# Patient Record
Sex: Female | Born: 1970
Health system: Southern US, Community
[De-identification: ages and names within clinical notes are randomized; demographics above are authoritative.]

## PROBLEM LIST (undated history)

## (undated) DIAGNOSIS — E785 Hyperlipidemia, unspecified: Secondary | ICD-10-CM

## (undated) DIAGNOSIS — D496 Neoplasm of unspecified behavior of brain: Secondary | ICD-10-CM

## (undated) DIAGNOSIS — M25562 Pain in left knee: Secondary | ICD-10-CM

## (undated) DIAGNOSIS — R519 Headache, unspecified: Secondary | ICD-10-CM

## (undated) DIAGNOSIS — D649 Anemia, unspecified: Secondary | ICD-10-CM

## (undated) DIAGNOSIS — F419 Anxiety disorder, unspecified: Secondary | ICD-10-CM

## (undated) DIAGNOSIS — E119 Type 2 diabetes mellitus without complications: Secondary | ICD-10-CM

## (undated) DIAGNOSIS — M1990 Unspecified osteoarthritis, unspecified site: Secondary | ICD-10-CM

## (undated) DIAGNOSIS — K219 Gastro-esophageal reflux disease without esophagitis: Secondary | ICD-10-CM

## (undated) DIAGNOSIS — M25561 Pain in right knee: Secondary | ICD-10-CM

## (undated) DIAGNOSIS — I10 Essential (primary) hypertension: Secondary | ICD-10-CM

## (undated) DIAGNOSIS — F410 Panic disorder [episodic paroxysmal anxiety] without agoraphobia: Secondary | ICD-10-CM

## (undated) HISTORY — DX: Type 2 diabetes mellitus without complications: E11.9

## (undated) HISTORY — DX: Anemia, unspecified: D64.9

## (undated) HISTORY — DX: Neoplasm of unspecified behavior of brain: D49.6

## (undated) HISTORY — DX: Pain in left knee: M25.562

## (undated) HISTORY — DX: Hyperlipidemia, unspecified: E78.5

## (undated) HISTORY — PX: ABDOMINAL HYSTERECTOMY: SHX81

## (undated) HISTORY — DX: Pain in right knee: M25.561

---

## 1998-11-22 DIAGNOSIS — K219 Gastro-esophageal reflux disease without esophagitis: Secondary | ICD-10-CM

## 1998-11-22 HISTORY — DX: Gastro-esophageal reflux disease without esophagitis: K21.9

## 2006-11-22 DIAGNOSIS — I10 Essential (primary) hypertension: Secondary | ICD-10-CM

## 2006-11-22 HISTORY — DX: Essential (primary) hypertension: I10

## 2007-11-23 HISTORY — PX: PARTIAL HYSTERECTOMY: SHX80

## 2009-11-22 HISTORY — PX: MENISCUS REPAIR: SHX5179

## 2009-11-22 HISTORY — PX: OTHER SURGICAL HISTORY: SHX169

## 2010-10-20 LAB — PULMONARY FUNCTION TEST

## 2010-11-22 DIAGNOSIS — M25562 Pain in left knee: Secondary | ICD-10-CM

## 2010-11-22 DIAGNOSIS — M25561 Pain in right knee: Secondary | ICD-10-CM

## 2010-11-22 DIAGNOSIS — F419 Anxiety disorder, unspecified: Secondary | ICD-10-CM

## 2010-11-22 DIAGNOSIS — F410 Panic disorder [episodic paroxysmal anxiety] without agoraphobia: Secondary | ICD-10-CM

## 2010-11-22 HISTORY — DX: Anxiety disorder, unspecified: F41.9

## 2010-11-22 HISTORY — DX: Pain in right knee: M25.561

## 2010-11-22 HISTORY — DX: Panic disorder (episodic paroxysmal anxiety): F41.0

## 2010-11-22 HISTORY — DX: Pain in left knee: M25.562

## 2012-01-12 ENCOUNTER — Emergency Department (HOSPITAL_COMMUNITY)
Admission: EM | Admit: 2012-01-12 | Discharge: 2012-01-12 | Disposition: A | Discharge status: Home / Self Care | Payer: Medicaid - Out of State | Attending: Emergency Medicine | Admitting: Emergency Medicine

## 2012-01-12 ENCOUNTER — Encounter (HOSPITAL_COMMUNITY): Payer: Self-pay | Admitting: Emergency Medicine

## 2012-01-12 DIAGNOSIS — I10 Essential (primary) hypertension: Secondary | ICD-10-CM | POA: Insufficient documentation

## 2012-01-12 DIAGNOSIS — H6092 Unspecified otitis externa, left ear: Secondary | ICD-10-CM

## 2012-01-12 DIAGNOSIS — H60399 Other infective otitis externa, unspecified ear: Secondary | ICD-10-CM | POA: Insufficient documentation

## 2012-01-12 DIAGNOSIS — H9209 Otalgia, unspecified ear: Secondary | ICD-10-CM | POA: Insufficient documentation

## 2012-01-12 HISTORY — DX: Essential (primary) hypertension: I10

## 2012-01-12 MED ORDER — CIPROFLOXACIN-DEXAMETHASONE 0.3-0.1 % OT SUSP
4.0000 [drp] | Freq: Two times a day (BID) | OTIC | Status: DC
  Filled 2012-01-12: qty 7.5

## 2012-01-12 MED ORDER — ANTIPYRINE-BENZOCAINE 5.4-1.4 % OT SOLN: 3.0000 [drp] | Freq: Four times a day (QID) | OTIC | Status: AC | PRN

## 2012-01-12 NOTE — ED Provider Notes (Signed)
History     CSN: [id number]  Arrival date & time 01/12/12  1452   First MD Initiated Contact with Patient 01/12/12 1546      Chief Complaint  Patient presents with  . Otalgia    putting drops in and nothing is going down, thinks something is in there lt side     (Consider location/radiation/quality/duration/timing/severity/associated sxs/prior treatment) HPI Comments: Patient reports three days of left ear pain.  States "it feels like something is moving in there" and she is concerned that there might be an insect in her ear.  Has been taking ibuprofen and tylenol without relief.  States she tried to put in some OTC ear drops but they would not go into her ear.  States she is now starting to have a sore throat.  Denies fevers, drainage from the ear.   The history is provided by the patient.    Past Medical History  Diagnosis Date  . Hypertension     History reviewed. No pertinent past surgical history.  No family history on file.  History  Substance Use Topics  . Smoking status: Not on file  . Smokeless tobacco: Not on file  . Alcohol Use:     OB History    Grav Para Term Preterm Abortions TAB SAB Ect Mult Living                  Review of Systems  All other systems reviewed and are negative.    Allergies  Review of patient's allergies indicates not on file.  Home Medications  No current outpatient prescriptions on file.  BP 134/91  Pulse 78  Temp(Src) 98 F (36.7 C) (Oral)  Resp 18  SpO2 100%  Physical Exam  Nursing note reviewed. Constitutional: She is oriented to person, place, and time. She appears well-developed and well-nourished.  HENT:  Head: Normocephalic and atraumatic.  Right Ear: Tympanic membrane normal.  Left Ear: Tympanic membrane normal. There is swelling and tenderness.  Mouth/Throat: Oropharynx is clear and moist. No oropharyngeal exudate.       Left ear pain with traction and with pressure on tragus.  Swelling of canal with small  amount of white exudate within canal.   Neck: Neck supple.  Pulmonary/Chest: Effort normal.  Neurological: She is alert and oriented to person, place, and time.    ED Course  Procedures (including critical care time)  Labs Reviewed - No data to display No results found.  5:22 PM ear wick with ciprodex placed in left ear canal.  Medication instructions explained to patient.   Filed Vitals:   01/12/12 1724  BP: 134/89  Pulse: 78  Temp:   Resp:      1. Otitis externa of left ear       MDM  Nontoxic patient with left ear pain, found to have swelling and tenderness of left ear canal c/w otitis externa.  Pt is not diabetic.          Dillard Cannon [place], [place] 01/12/12 1929

## 2012-01-12 NOTE — ED Provider Notes (Signed)
Medical screening examination/treatment/procedure(s) were performed by non-physician practitioner and as supervising physician I was immediately available for consultation/collaboration. Devoria Albe, MD, Armando Gang   Ward Givens, MD 01/12/12 [phone]

## 2012-01-12 NOTE — Discharge Instructions (Signed)
Please put 4 drops in your left ear twice daily for 7 days.  If the wick comes out during that time, please continue to use the drops.  The wick should be removed when you finish the drops.  If you need additional pain relief, please fill the prescription for auralgan, but do not use it within two hours before or after your medicated drops.  Continue to take ibuprofen and tylenol as needed for pain.  You may return to the ER at any time for worsening condition or any new symptoms that concern you.   Otitis Externa Otitis externa ("swimmer's ear") is a germ (bacterial) or fungal infection of the outer ear canal (from the eardrum to the outside of the ear). Swimming in dirty water may cause swimmer's ear. It also may be caused by moisture in the ear from water remaining after swimming or bathing. Often the first signs of infection may be itching in the ear canal. This may progress to ear canal swelling, redness, and pus drainage, which may be signs of infection. HOME CARE INSTRUCTIONS   Apply the antibiotic drops to the ear canal as prescribed by your doctor.   This can be a very painful medical condition. A strong pain reliever may be prescribed.   Only take over-the-counter or prescription medicines for pain, discomfort, or fever as directed by your caregiver.   If your caregiver has given you a follow-up appointment, it is very important to keep that appointment. Not keeping the appointment could result in a chronic or permanent injury, pain, hearing loss and disability. If there is any problem keeping the appointment, you must call back to this facility for assistance.  PREVENTION   It is important to keep your ear dry. Use the corner of a towel to wick water out of the ear canal after swimming or bathing.   Avoid scratching in your ear. This can damage the ear canal or remove the protective wax lining the canal and make it easier for germs (bacteria) or a fungus to grow.   You may use ear  drops made of rubbing alcohol and vinegar after swimming to prevent future "swimmer's ear" infections. Make up a small bottle of equal parts white vinegar and alcohol. Put 3 or 4 drops into each ear after swimming.   Avoid swimming in lakes, polluted water, or poorly chlorinated pools.  SEEK MEDICAL CARE IF:   An oral temperature above 102 F (38.9 C) develops.   Your ear is still painful after 3 days and shows signs of getting worse (redness, swelling, pain, or pus).  MAKE SURE YOU:   Understand these instructions.   Will watch your condition.   Will get help right away if you are not doing well or get worse.  Document Released: 11/08/2005 Document Revised: 07/21/2011 Document Reviewed: 06/14/2008 [facility] Patient Information 2012 [place], [place].  RESOURCE GUIDE  Dental Problems  Patients with Medicaid: [facility] [phone] [address]                                           [phone] [address] Phone:  [phone]  Phone:  [phone]  If unable to pay or uninsured, contact:  [facility] or [facility]. to become qualified for the adult dental clinic.  Chronic Pain Problems Contact [facility]  [phone] Patients need to be referred by their primary care doctor.  Insufficient Money for Medicine Contact United Way:  call "211" or [facility] [phone].  No Primary Care Doctor Call [facility]  [phone] Other agencies that provide inexpensive medical care    [facility]  [phone]    [facility] Medicine  [phone]    [facility]  [phone]    [facility]  [phone]    [facility]  [phone]    [facility] Child Clinic  [phone]  Psychological Services [facility] Behavioral Health  [phone] [facility] Services  [phone] [facility] Mental Health   [phone] (emergency services  [phone])  Substance Abuse Resources [facility] Drug Services  531-328-6808 [facility] [phone] [facility] [place] [phone] Floydene Flock [phone] Residential & Outpatient Substance Abuse Program  ([phone]  Abuse/Neglect [facility] Child Abuse Hotline 579-220-5051 [facility] Child Abuse Hotline [phone] (After Hours)  Emergency Shelter [facility] Ministries [phone]  Maternity Homes [facility] [place] [facility] [phone] [facility] [phone]  MRSA Hotline #:   [phone]    [facility] Resources  [facility]     United Way                          [facility] Dept. [address] [place]                       [address]      [address]  Blondell Reveal Phone:  [phone]                                   Phone:  [phone]                 Phone:  [phone]  [facility] Mental Health Phone:  [phone]  [facility] Child Abuse Hotline [phone] (After Hours)

## 2012-01-31 ENCOUNTER — Emergency Department (HOSPITAL_COMMUNITY)
Admission: EM | Admit: 2012-01-31 | Discharge: 2012-02-01 | Disposition: A | Discharge status: Home / Self Care | Payer: Medicaid - Out of State | Attending: Emergency Medicine | Admitting: Emergency Medicine

## 2012-01-31 ENCOUNTER — Other Ambulatory Visit: Payer: Self-pay

## 2012-01-31 ENCOUNTER — Encounter (HOSPITAL_COMMUNITY): Payer: Self-pay | Admitting: Emergency Medicine

## 2012-01-31 DIAGNOSIS — M79609 Pain in unspecified limb: Secondary | ICD-10-CM | POA: Insufficient documentation

## 2012-01-31 DIAGNOSIS — M79603 Pain in arm, unspecified: Secondary | ICD-10-CM

## 2012-01-31 DIAGNOSIS — K219 Gastro-esophageal reflux disease without esophagitis: Secondary | ICD-10-CM

## 2012-01-31 DIAGNOSIS — I10 Essential (primary) hypertension: Secondary | ICD-10-CM | POA: Insufficient documentation

## 2012-01-31 MED ORDER — GI COCKTAIL ~~LOC~~
30.0000 mL | Freq: Once | ORAL | Status: AC
  Administered 2012-02-01: 30 mL via ORAL
  Filled 2012-01-31: qty 30

## 2012-01-31 NOTE — ED Notes (Signed)
Pt is c/o pain in her left upper arm that started this evening  Pt states she has also had some shortness of breath  Pt states she has been moving and has had pain in her left wrist but this is different  Pt describes pain as a throbbing pain that comes and goes and states it is a burning pain as well

## 2012-01-31 NOTE — ED Notes (Signed)
Pt reports left shoulder pain. Pt reports burning and throbbing pain to left shoulder after cooking. Pt also reports she just moved and may have pulled muscle. Pt also has left hand pain that she sprained two weeks.  Pt reports she had shortness of breath initially, but shortness of breath is gone now.  Pt also having indigestion.

## 2012-01-31 NOTE — ED Provider Notes (Signed)
History     CSN: [id number]  Arrival date & time 01/31/12  2004   First MD Initiated Contact with Patient 01/31/12 2342      Chief Complaint  Patient presents with  . Shoulder Pain    (Consider location/radiation/quality/duration/timing/severity/associated sxs/prior treatment) Patient is a 41 y.o. female presenting with shoulder pain. The history is provided by the patient. No language interpreter was used.  Shoulder Pain This is a new problem. The current episode started today. The problem occurs constantly. The problem has been unchanged. Pertinent negatives include no chest pain, coughing, fever, joint swelling, nausea, neck pain, sore throat, vomiting or weakness.   Pt states that around 5pm while she was cooking she started having a burning sensation to her L upper arm that is constant since 5pm.  At one point she became SOB and thought she was having a panic attack.  States she is new in town from [place] and she has been moving and boxing things. 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 States that her only risk factor is hypertension. States she has been having epigastric pain just like her GERD and for 3 days and she has been taking mustard for that with relief.  States she ran out of her nexium and has not gotten a pcp yet.        Past Medical History  Diagnosis Date  . Hypertension     Past Surgical History  Procedure Date  . Partial hysterectomy   . Laproscopic knee surgery   . Cesarean section     Family History  Problem Relation Age of Onset  . Diabetes Mother   . Hypertension Other     History  Substance Use Topics  . Smoking status: Never Smoker   . Smokeless tobacco: Not on file  . Alcohol Use: Yes     occassioanl    OB History    Grav Para Term Preterm Abortions TAB SAB Ect Mult Living                  Review of Systems  Constitutional: Negative for fever.  HENT: Negative for sore throat and neck pain.   Respiratory:  Negative for cough.   Cardiovascular: Negative for chest pain.  Gastrointestinal: Negative for nausea and vomiting.  Musculoskeletal: Negative for joint swelling.  Neurological: Negative for weakness.    Allergies  Penicillins  Home Medications   Current Outpatient Rx  Name Route Sig Dispense Refill  . ACETAMINOPHEN 500 MG PO TABS Oral Take 1,000 mg by mouth once.    . ARTHRITIS STRENGTH BC POWDER PO Oral Take by mouth.    . IBUPROFEN 200 MG PO TABS Oral Take 400 mg by mouth every 4 (four) hours.    . INDAPAMIDE 2.5 MG PO TABS Oral Take 2.5 mg by mouth daily.    [name] VITAMIN D (ERGOCALCIFEROL) 50000 UNITS PO CAPS Oral Take 50,000 Units by mouth every 7 (seven) days. ON MONDAY      BP 134/100  Pulse 72  Temp(Src) 98.2 F (36.8 C) (Oral)  Resp 20  SpO2 99%  Physical Exam  Nursing note and vitals reviewed. Constitutional: She is oriented to person, place, and time. She appears well-developed and well-nourished.  HENT:  Head: Normocephalic and atraumatic.  Eyes: Conjunctivae and EOM are normal. Pupils are equal, round, and reactive to light.  Neck: Normal range of motion. Neck supple.  Cardiovascular: Normal rate, normal heart sounds and intact distal  pulses.  Exam reveals no gallop and no friction rub.   No murmur heard. Pulmonary/Chest: Effort normal and breath sounds normal. No respiratory distress.  Abdominal: Soft. Bowel sounds are normal.  Musculoskeletal: Normal range of motion. She exhibits no edema and no tenderness.       LUE pain  Neurological: She is alert and oriented to person, place, and time. She has normal reflexes.  Skin: Skin is warm and dry.  Psychiatric: She has a normal mood and affect.    ED Course  Procedures (including critical care time)  Labs Reviewed - No data to display No results found.   No diagnosis found.    MDM   Date: 02/01/2012  Rate: 68  Rhythm: normal sinus rhythm  QRS Axis: normal  Intervals: normal  ST/T Wave  abnormalities: normal  Conduction Disutrbances:none  Narrative Interpretation:   Old EKG Reviewed: none available   LUE burning pain with heartburn x 2 days. EKG and trop negative for MI.  Some relief with tylenol.  Good relief with GI cocktail.  Will renew her nexium and give percocet rx for LUE pain.  Return for CP, SOB.  Labs unremarkable.  Potassium for K 3.0. Will pick a PCP from list to follow up with asap.          Jethro Bastos, NP 02/02/12 1326

## 2012-02-01 LAB — DIFFERENTIAL
Lymphocytes Relative: 32 % (ref 12–46)
Lymphs Abs: 3.2 10*3/uL (ref 0.7–4.0)
Monocytes Absolute: 0.6 10*3/uL (ref 0.1–1.0)
Monocytes Relative: 6 % (ref 3–12)
Neutro Abs: 5.8 10*3/uL (ref 1.7–7.7)

## 2012-02-01 LAB — POCT I-STAT, CHEM 8
BUN: 15 mg/dL (ref 6–23)
Calcium, Ion: 1.19 mmol/L (ref 1.12–1.32)
Chloride: 101 mEq/L (ref 96–112)
Glucose, Bld: 141 mg/dL — ABNORMAL HIGH (ref 70–99)
Potassium: 3 mEq/L — ABNORMAL LOW (ref 3.5–5.1)

## 2012-02-01 LAB — CBC
HCT: 39.5 % (ref 36.0–46.0)
Hemoglobin: 13.9 g/dL (ref 12.0–15.0)
RBC: 4.7 MIL/uL (ref 3.87–5.11)
WBC: 9.8 10*3/uL (ref 4.0–10.5)

## 2012-02-01 MED ORDER — ESOMEPRAZOLE MAGNESIUM 40 MG PO CPDR: 40.0000 mg | DELAYED_RELEASE_CAPSULE | Freq: Every day | ORAL | Status: DC

## 2012-02-01 MED ORDER — OXYCODONE-ACETAMINOPHEN 5-325 MG PO TABS: 1.0000 | ORAL_TABLET | ORAL | Status: AC | PRN

## 2012-02-01 MED ORDER — POTASSIUM CHLORIDE CRYS ER 20 MEQ PO TBCR
40.0000 meq | EXTENDED_RELEASE_TABLET | Freq: Two times a day (BID) | ORAL | Status: DC
Start: 2012-02-01 — End: 2012-02-01
  Administered 2012-02-01: 40 meq via ORAL
  Filled 2012-02-01: qty 2

## 2012-02-01 MED ORDER — OXYCODONE-ACETAMINOPHEN 5-325 MG PO TABS: 2.0000 | ORAL_TABLET | ORAL | Status: DC | PRN

## 2012-02-01 MED ORDER — ACETAMINOPHEN 325 MG PO TABS
650.0000 mg | ORAL_TABLET | Freq: Once | ORAL | Status: AC
  Administered 2012-02-01: 650 mg via ORAL
  Filled 2012-02-01: qty 2

## 2012-02-01 NOTE — Discharge Instructions (Signed)
Ms Woodroof the EKG and trop which show if you are having a heart attack tonight in the ER were negative. Your potassium was slightly low at 3.0 and 3.5 so we gave him 40 mEq of potassium. I refilled her Nexium for your current. Stopped taking diabetes powders this could be causing pain in your epigastric area. Take Percocet for pain do not drive with this medication. Return to the ER for any severe chest pain shortness of breath or any other concern she might have. PCP from the list below.     RESOURCE GUIDE  Dental Problems  Patients with Medicaid: [facility] [phone] [address]                                           [phone] [address] Phone:  [phone]                                                  Phone:  [phone]  If unable to pay or uninsured, contact:  [facility] or [facility]. to become qualified for the adult dental clinic.  Chronic Pain Problems Contact [facility]  [phone] Patients need to be referred by their primary care doctor.  Insufficient Money for Medicine Contact United Way:  call "211" or [facility] [phone].  No Primary Care Doctor Call [facility]  [phone] Other agencies that provide inexpensive medical care    [facility]  [phone]    [facility] Internal Medicine  [phone]    [facility]  [phone]    [facility]  [phone]    [facility]  [phone]    [facility] Child Clinic  [phone]  Psychological Services [facility] Behavioral Health  [phone] [facility] Services  [phone] [facility] Mental Health   [phone] (emergency services [phone])  Substance Abuse Resources Alcohol and Drug Services  [phone] [facility] [phone] [facility] [place] [phone] Floydene Flock [phone] Residential & Outpatient Substance Abuse Program  [phone]  Abuse/Neglect [facility] Child Abuse  Hotline 706-175-7647 [facility] Child Abuse Hotline 843-742-5942 (After Hours)  Emergency Shelter [facility] Ministries [phone]  Maternity Homes Room at the [place] [facility] [phone] [facility] [phone]  MRSA Hotline #:   561-665-2854    [facility] Resources  [facility]     United Way                          [facility] Dept. [address]                        [address]      [address]  [address]  Sela Hua Phone:  [phone]                                   Phone:  [phone]                 Phone:  [place] Phone:  [place] [phone] (After Hours)

## 2012-02-02 NOTE — ED Provider Notes (Signed)
Medical screening examination/treatment/procedure(s) were performed by non-physician practitioner and as supervising physician I was immediately available for consultation/collaboration.   Tamee Battin M Sky Primo, MD 02/02/12 2311 

## 2012-03-08 ENCOUNTER — Emergency Department (HOSPITAL_COMMUNITY): Payer: Medicaid - Out of State

## 2012-03-08 ENCOUNTER — Emergency Department (HOSPITAL_COMMUNITY)
Admission: EM | Admit: 2012-03-08 | Discharge: 2012-03-08 | Disposition: A | Discharge status: Home / Self Care | Payer: Medicaid - Out of State | Attending: Emergency Medicine | Admitting: Emergency Medicine

## 2012-03-08 ENCOUNTER — Encounter (HOSPITAL_COMMUNITY): Payer: Self-pay | Admitting: Emergency Medicine

## 2012-03-08 DIAGNOSIS — M79602 Pain in left arm: Secondary | ICD-10-CM

## 2012-03-08 DIAGNOSIS — F411 Generalized anxiety disorder: Secondary | ICD-10-CM | POA: Insufficient documentation

## 2012-03-08 DIAGNOSIS — K219 Gastro-esophageal reflux disease without esophagitis: Secondary | ICD-10-CM | POA: Insufficient documentation

## 2012-03-08 DIAGNOSIS — M79609 Pain in unspecified limb: Secondary | ICD-10-CM | POA: Insufficient documentation

## 2012-03-08 DIAGNOSIS — Z79899 Other long term (current) drug therapy: Secondary | ICD-10-CM | POA: Insufficient documentation

## 2012-03-08 DIAGNOSIS — A599 Trichomoniasis, unspecified: Secondary | ICD-10-CM | POA: Insufficient documentation

## 2012-03-08 DIAGNOSIS — I10 Essential (primary) hypertension: Secondary | ICD-10-CM | POA: Insufficient documentation

## 2012-03-08 HISTORY — DX: Panic disorder (episodic paroxysmal anxiety): F41.0

## 2012-03-08 HISTORY — DX: Gastro-esophageal reflux disease without esophagitis: K21.9

## 2012-03-08 HISTORY — DX: Anxiety disorder, unspecified: F41.9

## 2012-03-08 LAB — URINE MICROSCOPIC-ADD ON

## 2012-03-08 LAB — BASIC METABOLIC PANEL
CO2: 28 mEq/L (ref 19–32)
Chloride: 101 mEq/L (ref 96–112)
Glucose, Bld: 156 mg/dL — ABNORMAL HIGH (ref 70–99)
Potassium: 3.7 mEq/L (ref 3.5–5.1)
Sodium: 137 mEq/L (ref 135–145)

## 2012-03-08 LAB — PREGNANCY, URINE: Preg Test, Ur: NEGATIVE

## 2012-03-08 LAB — DIFFERENTIAL
Eosinophils Absolute: 0.1 10*3/uL (ref 0.0–0.7)
Eosinophils Relative: 2 % (ref 0–5)
Lymphocytes Relative: 35 % (ref 12–46)
Lymphs Abs: 2.4 10*3/uL (ref 0.7–4.0)
Monocytes Relative: 8 % (ref 3–12)

## 2012-03-08 LAB — URINALYSIS, ROUTINE W REFLEX MICROSCOPIC
Bilirubin Urine: NEGATIVE
Hgb urine dipstick: NEGATIVE
Nitrite: NEGATIVE
Specific Gravity, Urine: 1.028 (ref 1.005–1.030)
Urobilinogen, UA: 0.2 mg/dL (ref 0.0–1.0)
pH: 6 (ref 5.0–8.0)

## 2012-03-08 LAB — CBC
HCT: 40.6 % (ref 36.0–46.0)
Hemoglobin: 14 g/dL (ref 12.0–15.0)
MCH: 29.2 pg (ref 26.0–34.0)
MCV: 84.6 fL (ref 78.0–100.0)
Platelets: 227 10*3/uL (ref 150–400)
RBC: 4.8 MIL/uL (ref 3.87–5.11)
WBC: 6.8 10*3/uL (ref 4.0–10.5)

## 2012-03-08 LAB — D-DIMER, QUANTITATIVE: D-Dimer, Quant: <0.22 ug/mL-FEU (ref 0.00–0.48)

## 2012-03-08 LAB — TROPONIN I: Troponin I: <0.3 ng/mL (ref <0.30)

## 2012-03-08 MED ORDER — METRONIDAZOLE 500 MG PO TABS: 500.0000 mg | ORAL_TABLET | Freq: Two times a day (BID) | ORAL | Status: AC

## 2012-03-08 MED ORDER — GI COCKTAIL ~~LOC~~
30.0000 mL | Freq: Once | ORAL | Status: DC
  Filled 2012-03-08: qty 30

## 2012-03-08 MED ORDER — ALBUTEROL SULFATE (5 MG/ML) 0.5% IN NEBU
5.0000 mg | INHALATION_SOLUTION | Freq: Once | RESPIRATORY_TRACT | Status: AC
  Administered 2012-03-08: 5 mg via RESPIRATORY_TRACT
  Filled 2012-03-08: qty 1

## 2012-03-08 MED ORDER — FAMOTIDINE IN NACL 20-0.9 MG/50ML-% IV SOLN
20.0000 mg | Freq: Once | INTRAVENOUS | Status: AC
  Administered 2012-03-08: 20 mg via INTRAVENOUS
  Filled 2012-03-08: qty 50

## 2012-03-08 MED ORDER — PANTOPRAZOLE SODIUM 40 MG IV SOLR
40.0000 mg | Freq: Once | INTRAVENOUS | Status: AC
  Administered 2012-03-08: 40 mg via INTRAVENOUS
  Filled 2012-03-08: qty 40

## 2012-03-08 MED ORDER — SODIUM CHLORIDE 0.9 % IV SOLN
INTRAVENOUS | Status: DC
  Administered 2012-03-08: 10:00:00 via INTRAVENOUS

## 2012-03-08 MED ORDER — IPRATROPIUM BROMIDE 0.02 % IN SOLN
0.5000 mg | Freq: Once | RESPIRATORY_TRACT | Status: AC
  Administered 2012-03-08: 0.5 mg via RESPIRATORY_TRACT
  Filled 2012-03-08: qty 2.5

## 2012-03-08 NOTE — ED Notes (Signed)
To ED via private vehicle with c/o intermittent chest pain-- heaviness started last night.

## 2012-03-08 NOTE — ED Provider Notes (Signed)
History     CSN: [id number]  Arrival date & time 03/08/12  4098   First MD Initiated Contact with Patient 03/08/12 [phone]      Chief Complaint  Patient presents with  . Chest Pain  . Shortness of Breath    HPI Pt was seen at 0905.  Per pt, c/o gradual onset and persistence of constant chest "heaviness" x3 days, worse since last night.  Has been assoc with SOB and left arm "burning" pain for the past month.  Pt was eval in ED approx 1 month ago for the same symptoms, has not obtained PMD, rx nexium but did not fill it.  Denies palpitations, no N/V/D, no cough, no fevers, no rash, no focal motor weakness, no tingling/numbness in extremities, no neck pain.       Past Medical History  Diagnosis Date  . Hypertension   . Anxiety   . Panic attack   . GERD (gastroesophageal reflux disease)     Past Surgical History  Procedure Date  . Partial hysterectomy   . Laproscopic knee surgery   . Cesarean section     Family History  Problem Relation Age of Onset  . Diabetes Mother   . Hypertension Other     History  Substance Use Topics  . Smoking status: Never Smoker   . Smokeless tobacco: Not on file  . Alcohol Use: Yes     occassioanl    Review of Systems ROS: Statement: All systems negative except as marked or noted in the HPI; Constitutional: Negative for fever and chills. ; ; Eyes: Negative for eye pain, redness and discharge. ; ; ENMT: Negative for ear pain, hoarseness, nasal congestion, sinus pressure and sore throat. ; ; Cardiovascular: Negative for chest pain, palpitations, diaphoresis, dyspnea and peripheral edema. ; ; Respiratory: Negative for cough, wheezing and stridor. ; ; Gastrointestinal: Negative for nausea, vomiting, diarrhea, abdominal pain, blood in stool, hematemesis, jaundice and rectal bleeding. . ; ; Genitourinary: Negative for dysuria, flank pain and hematuria. ; ; Musculoskeletal: Negative for back pain and neck pain. Negative for swelling and trauma.; ; Skin:  Negative for pruritus, rash, abrasions, blisters, bruising and skin lesion.; ; Neuro: Negative for headache, lightheadedness and neck stiffness. Negative for weakness, altered level of consciousness , altered mental status, extremity weakness, paresthesias, involuntary movement, seizure and syncope.; Psych:  No SI, no SA, no HI, no hallucinations, +anxiety.     Allergies  Penicillins  Home Medications   Current Outpatient Rx  Name Route Sig Dispense Refill  . ACETAMINOPHEN 500 MG PO TABS Oral Take 1,000 mg by mouth once.    . ARTHRITIS STRENGTH BC POWDER PO Oral Take by mouth.    . ESOMEPRAZOLE MAGNESIUM 40 MG PO CPDR Oral Take 1 capsule (40 mg total) by mouth daily. 30 capsule 0  . IBUPROFEN 200 MG PO TABS Oral Take 400 mg by mouth every 4 (four) hours.    . INDAPAMIDE 2.5 MG PO TABS Oral Take 2.5 mg by mouth daily.    [name] VITAMIN D (ERGOCALCIFEROL) 50000 UNITS PO CAPS Oral Take 50,000 Units by mouth every 7 (seven) days. ON MONDAY      BP 106/69  Pulse 78  Resp 20  SpO2 98%  Physical Exam 0910: Physical examination:  Nursing notes reviewed; Vital signs and O2 SAT reviewed;  Constitutional: Well developed, Well nourished, Well hydrated, In no acute distress; Head:  Normocephalic, atraumatic; Eyes: EOMI, PERRL, No scleral icterus; ENMT: Mouth and pharynx normal, Mucous membranes  moist; Neck: Supple, Full range of motion, No lymphadenopathy; Cardiovascular: Regular rate and rhythm, No murmur or gallop; Respiratory: Breath sounds clear & equal bilaterally, No rales, rhonchi, wheezes, Normal respiratory effort/excursion; Chest: Nontender, Movement normal; Abdomen: Soft, Nontender, Nondistended, Normal bowel sounds; Spine:  No midline CS, TS, LS tenderness. +left trapezius muscle TTP.; Extremities: Pulses normal, No tenderness, No edema, No calf edema or asymmetry.; Neuro: AA&Ox3, Major CN grossly intact. Speech clear, no facial droop.  No gross focal motor or sensory deficits in extremities.;  Skin: Color normal, Warm, Dry, no rash.; Psych:  Anxious.   ED Course  Procedures   0915:  Pt states she did not fill the rx for nexium from her last ED visit, endorsing she "had a few pills left at home" and was sporadically taking those.  Has not obtained local PMD since last ED visit.  Counselled by ED RN and myself regarding outpt f/u after ED visits for good continuity of medical care.    MDM  MDM Reviewed: nursing note, vitals and previous chart Reviewed previous: ECG Interpretation: ECG, labs and x-ray    Date: 03/08/2012  Rate: 73  Rhythm: normal sinus rhythm  QRS Axis: normal  Intervals: normal  ST/T Wave abnormalities: normal  Conduction Disutrbances:none  Narrative Interpretation:   Old EKG Reviewed: unchanged; no significant changes from previous EKG dated 01/31/2012.   Results for orders placed during the hospital encounter of 03/08/12  TROPONIN I      Component Value Range   Troponin I <0.30  <0.30 (ng/mL)  BASIC METABOLIC PANEL      Component Value Range   Sodium 137  135 - 145 (mEq/L)   Potassium 3.7  3.5 - 5.1 (mEq/L)   Chloride 101  96 - 112 (mEq/L)   CO2 28  19 - 32 (mEq/L)   Glucose, Bld 156 (*) 70 - 99 (mg/dL)   BUN 12  6 - 23 (mg/dL)   Creatinine, Ser 8.33  0.50 - 1.10 (mg/dL)   Calcium 9.1  8.4 - 82.5 (mg/dL)   GFR calc non Af Amer >90  >90 (mL/min)   GFR calc Af Amer >90  >90 (mL/min)  CBC      Component Value Range   WBC 6.8  4.0 - 10.5 (K/uL)   RBC 4.80  3.87 - 5.11 (MIL/uL)   Hemoglobin 14.0  12.0 - 15.0 (g/dL)   HCT 05.3  97.6 - 73.4 (%)   MCV 84.6  78.0 - 100.0 (fL)   MCH 29.2  26.0 - 34.0 (pg)   MCHC 34.5  30.0 - 36.0 (g/dL)   RDW 19.3  79.0 - 24.0 (%)   Platelets 227  150 - 400 (K/uL)  DIFFERENTIAL      Component Value Range   Neutrophils Relative 53  43 - 77 (%)   Neutro Abs 3.6  1.7 - 7.7 (K/uL)   Lymphocytes Relative 35  12 - 46 (%)   Lymphs Abs 2.4  0.7 - 4.0 (K/uL)   Monocytes Relative 8  3 - 12 (%)   Monocytes Absolute  0.6  0.1 - 1.0 (K/uL)   Eosinophils Relative 2  0 - 5 (%)   Eosinophils Absolute 0.1  0.0 - 0.7 (K/uL)   Basophils Relative 1  0 - 1 (%)   Basophils Absolute 0.1  0.0 - 0.1 (K/uL)  D-DIMER, QUANTITATIVE      Component Value Range   D-Dimer, Quant <0.22  0.00 - 0.48 (ug/mL-FEU)  PREGNANCY, URINE  Component Value Range   Preg Test, Ur NEGATIVE  NEGATIVE   URINALYSIS, ROUTINE W REFLEX MICROSCOPIC      Component Value Range   Color, Urine YELLOW  YELLOW    APPearance CLOUDY (*) CLEAR    Specific Gravity, Urine 1.028  1.005 - 1.030    pH 6.0  5.0 - 8.0    Glucose, UA NEGATIVE  NEGATIVE (mg/dL)   Hgb urine dipstick NEGATIVE  NEGATIVE    Bilirubin Urine NEGATIVE  NEGATIVE    Ketones, ur NEGATIVE  NEGATIVE (mg/dL)   Protein, ur NEGATIVE  NEGATIVE (mg/dL)   Urobilinogen, UA 0.2  0.0 - 1.0 (mg/dL)   Nitrite NEGATIVE  NEGATIVE    Leukocytes, UA SMALL (*) NEGATIVE   URINE MICROSCOPIC-ADD ON      Component Value Range   Squamous Epithelial / LPF MANY (*) RARE    Bacteria, UA FEW (*) RARE    Urine-Other TRICHOMONAS PRESENT     Dg Chest 2 View 03/08/2012  *RADIOLOGY REPORT*  Clinical Data: SOB, cough  CHEST - 2 VIEW  Comparison: None.  Findings: Lungs are essentially clear.  No pleural effusion or pneumothorax.  Cardiomediastinal silhouette is within normal limits.  Mild degenerative changes of the visualized thoracolumbar spine.  IMPRESSION: No evidence of acute cardiopulmonary disease.  Original Report Authenticated By: Charline Bills, M.D.     2:13 PM:  Pt states she has to leave now.  Feels improved after meds.  Has been typing on her laptop computer, talking on the phone and watching TV most of her ED visit.  UA with +trich, no clear UTI (appears contaminated).  UC pending.  Will treat for +trich.  Dx testing d/w pt.  Questions answered.  Verb understanding, agreeable to d/c home with outpt f/u.              Laray Anger, DO 03/10/12 2234

## 2012-03-08 NOTE — Discharge Instructions (Signed)
RESOURCE GUIDE  Dental Problems  Patients with Medicaid: [facility]                     [facility] [address]                                           [address] Phone:  [phone]                                                  Phone:  [phone]  If unable to pay or uninsured, contact:  [facility] or [facility] to become qualified for the adult dental clinic.  Chronic Pain Problems Contact [facility]  [phone] Patients need to be referred by their primary care doctor.  Insufficient Money for Medicine Contact United Way:  call "211" or [facility] [phone].  No Primary Care Doctor Call [facility]  [phone] Other agencies that provide inexpensive medical care    [facility]  [phone]    [facility]  [phone]    [facility]  [phone]    Women's Clinic  [phone]    [facility]  [phone]    [facility]  [phone]  Psychological Services [facility]  [phone] [facility]  [phone] [facility]   [phone] (emergency services [phone])  Substance Abuse Resources [facility]  336-882-2125 [facility] 336-784-9470 [facility] 336-285-9073 [facility] 336-845-3988 Residential & Outpatient Substance Abuse Program  800-659-3381  Abuse/Neglect [place] Child Abuse Hotline (336) 641-3795 [place] Child Abuse Hotline 800-378-5315 (After Hours)  Emergency Shelter [facility] (336) 271-5985  Maternity Homes [facility] (336) 275-9566 [facility] (704) [phone]  MRSA Hotline #:   832-7006    [place] [facility]  [facility] [place]     [employer]                          [facility] [address] [place]                       [address]      [address]  [place]                                                [place]                            [place] Phone:  [phone]                                   Phone:  [phone]                 Phone:  [phone]  [facility] Phone:  [phone]    [facility] Child Abuse Hotline 503 065 0784 5203705524 (After Hours)    Eat a bland diet, avoiding greasy, fatty, fried foods, as well as spicy and acidic foods or beverages.  Avoid eating within the hour or 2 before going to bed or laying down.  Also avoid teas, colas, coffee, chocolate, pepermint and spearment.  Take over the counter pepcid, one tablet by mouth twice a day, for the next 3 to 4 weeks.  May also take over the counter maalox/mylanta, as directed on packaging, as needed for discomfort.  Take the prescription as directed.  Call your regular medical doctor and the GI doctor today to schedule a follow up appointment within the next week.  Return to the Emergency Department immediately if worsening.

## 2012-03-09 LAB — URINE CULTURE: Culture  Setup Time: 201304172153

## 2012-03-13 ENCOUNTER — Observation Stay (HOSPITAL_COMMUNITY)
Admission: EM | Admit: 2012-03-13 | Discharge: 2012-03-14 | Disposition: A | Discharge status: Home / Self Care | Payer: Medicaid - Out of State | Source: Ambulatory Visit | Attending: Emergency Medicine | Admitting: Emergency Medicine

## 2012-03-13 ENCOUNTER — Emergency Department (HOSPITAL_COMMUNITY): Payer: Self-pay

## 2012-03-13 ENCOUNTER — Encounter (HOSPITAL_COMMUNITY): Payer: Self-pay | Admitting: *Deleted

## 2012-03-13 ENCOUNTER — Other Ambulatory Visit: Payer: Self-pay

## 2012-03-13 DIAGNOSIS — R0602 Shortness of breath: Secondary | ICD-10-CM

## 2012-03-13 DIAGNOSIS — I10 Essential (primary) hypertension: Secondary | ICD-10-CM | POA: Insufficient documentation

## 2012-03-13 DIAGNOSIS — M79609 Pain in unspecified limb: Secondary | ICD-10-CM | POA: Insufficient documentation

## 2012-03-13 DIAGNOSIS — R079 Chest pain, unspecified: Principal | ICD-10-CM | POA: Insufficient documentation

## 2012-03-13 LAB — CBC
MCH: 29.1 pg (ref 26.0–34.0)
MCHC: 34.4 g/dL (ref 30.0–36.0)
Platelets: 236 10*3/uL (ref 150–400)

## 2012-03-13 LAB — BASIC METABOLIC PANEL
Calcium: 9.4 mg/dL (ref 8.4–10.5)
GFR calc non Af Amer: >90 mL/min (ref >90)
Sodium: 140 mEq/L (ref 135–145)

## 2012-03-13 LAB — TROPONIN I
Troponin I: <0.3 ng/mL (ref <0.30)
Troponin I: <0.3 ng/mL (ref <0.30)

## 2012-03-13 MED ORDER — ZOLPIDEM TARTRATE 5 MG PO TABS
5.0000 mg | ORAL_TABLET | Freq: Every evening | ORAL | Status: DC | PRN
  Administered 2012-03-13: 5 mg via ORAL
  Filled 2012-03-13: qty 1

## 2012-03-13 MED ORDER — ASPIRIN 81 MG PO CHEW
324.0000 mg | CHEWABLE_TABLET | Freq: Once | ORAL | Status: AC
  Administered 2012-03-13: 324 mg via ORAL
  Filled 2012-03-13: qty 4

## 2012-03-13 MED ORDER — KETOROLAC TROMETHAMINE 30 MG/ML IJ SOLN
30.0000 mg | Freq: Once | INTRAMUSCULAR | Status: AC
  Administered 2012-03-13: 30 mg via INTRAVENOUS
  Filled 2012-03-13: qty 1

## 2012-03-13 MED ORDER — ALPRAZOLAM 0.5 MG PO TABS
0.5000 mg | ORAL_TABLET | Freq: Once | ORAL | Status: AC
  Administered 2012-03-13: 0.5 mg via ORAL
  Filled 2012-03-13: qty 1

## 2012-03-13 MED ORDER — NITROGLYCERIN 0.4 MG SL SUBL
0.4000 mg | SUBLINGUAL_TABLET | Freq: Once | SUBLINGUAL | Status: AC
  Administered 2012-03-13: 0.4 mg via SUBLINGUAL
  Filled 2012-03-13: qty 25

## 2012-03-13 MED ORDER — ALUM & MAG HYDROXIDE-SIMETH 200-200-20 MG/5ML PO SUSP
15.0000 mL | Freq: Once | ORAL | Status: AC
  Administered 2012-03-13: 15 mL via ORAL
  Filled 2012-03-13: qty 30

## 2012-03-13 NOTE — ED Notes (Signed)
Patient not in exam room yet

## 2012-03-13 NOTE — ED Notes (Signed)
CDU RN unable to take patient at this time. She is to call when ready.

## 2012-03-13 NOTE — ED Provider Notes (Signed)
History     CSN: [id number]  Arrival date & time 03/13/12  1452   First MD Initiated Contact with Patient 03/13/12 1556      Chief Complaint  Patient presents with  . Shortness of Breath    lt. arm pain    (Consider location/radiation/quality/duration/timing/severity/associated sxs/prior treatment) HPI Pt seen at 41:58 PM. 41 year old female with a past medical history of hypertension presents to the emergency department for her second evaluation in one week with a chief complaint of upper middle and left chest heaviness, associated with shortness of breath, occurring intermittently for the last month with episodes getting more frequent in the last week. Pain lasts for a couple of hours at a time, with multiple episodes daily , more frequent at night. Pain initially nonradiating but today began to radiate to the left shoulder and arm with a burning sensation. Also new today was a sensation of lightheadedness as if she might pass out they began when her chest tightness began, but has resolved at the time of examination. There has been no diaphoresis or nausea. No recent illness. No fever, chills, cough. Although she has a history of reflux, feels is well controlled. The symptoms do not feel similar to reflux in the past. Symptoms begin randomly, sometimes associated with eating but not always. They are nonexertional and nothing in particular makes them worse or better. Reports that her prior evaluation, a chest x-ray and basic laboratory studies were performed without any abnormal findings. She was given a medication to drink it made her very anxious and did not change the pain.   Family history significant for MI in her mother in her 30s. Patient has never had any cardiac evaluation aside from recent emergency department visit. No personal or known family history of pulmonary embolus or DVT. No recent prolonged trip. No exogenous estrogen use.    Past Medical History  Diagnosis Date  .  Hypertension     Past Surgical History  Procedure Date  . Abdominal hysterectomy   . Joint replacement     History reviewed. No pertinent family history.  History  Substance Use Topics  . Smoking status: Never Smoker   . Smokeless tobacco: Not on file  . Alcohol Use: No     Review of Systems 10 systems reviewed and are negative for acute change except as noted in the HPI.  Allergies  Penicillins  Home Medications   Current Outpatient Rx  Name Route Sig Dispense Refill  . ACETAMINOPHEN 500 MG PO TABS Oral Take 1,000 mg by mouth every 6 (six) hours as needed. For pain    . INDAPAMIDE 2.5 MG PO TABS Oral Take 2.5 mg by mouth at bedtime.    [name] METRONIDAZOLE 500 MG PO TABS Oral Take 500 mg by mouth 2 (two) times daily. X 7 days. Started on 03/08/12.      BP 129/87  Pulse 80  Temp(Src) 97.3 F (36.3 C) (Oral)  Resp 20  Physical Exam Vital signs are reviewed and are normal. Nursing notes reviewed. Well-developed, well-nourished female, anxious appearing but in no acute distress. Head is normocephalic and atraumatic. Oromucosa moist. Pupils are equal, round, reactive to light. Sclera anicteric. Neck is supple without adenopathy. Heart with regular rate and rhythm with no audible murmur. Bilateral radial and dorsalis pedis pulses are 2+. Normal respiratory effort and excursion. Lungs are clear to auscultation bilaterally. There is no chest wall tenderness. Abdomen is soft, obese, nontender. Bowel sounds are normal. There is no guarding. Extremities  are without edema or tenderness. Moves all extremities well. Normal gait. Entire spine nontender to palpation. Alert and oriented x3. Cranial nerves III through XII are tested and intact. Speech is normal. Skin is warm and dry without any obvious rash or lesion.  ED Course  Procedures (including critical care time)   Labs Reviewed  CBC  BASIC METABOLIC PANEL  TROPONIN I   Dg Chest 2 View  03/13/2012  *RADIOLOGY REPORT*  Clinical  Data: Chest pain and shortness of breath.  CHEST - 2 VIEW  Comparison: None  Findings: The cardiac silhouette, mediastinal and hilar contours are within normal limits.  The lungs are clear.  No pleural effusion.  The bony thorax is intact.  IMPRESSION: No acute cardiopulmonary findings.  Original Report Authenticated By: P. Loralie Champagne, M.D.    Date: 03/13/2012  Rate: 82  Rhythm: normal sinus rhythm  QRS Axis: normal  Intervals: normal  ST/T Wave abnormalities: normal  Conduction Disutrbances: none  Old EKG Reviewed: from4/17/2013, no changes seen        MDM  4:00 PM Pt not yet in room.  4:20 PM Pt not yet in room.       6:40 PM EKG unremarkable. Negative troponin. CXR reviewed, no acute findings. BMP and CBC unremarkable. Due to hx HTN, pt concern about possible cardiac etiology, and fam hx of early CAD with low clinical suspicion for ACS, this patient is a good candidate for chest pain protocol. BMI necessitates a stress test, which pt will be able to cooperate with. She is TIMI 0. HEART score 1.     9:20 PM Pt with anxiety regarding testing tomorrow. Xanax ordered. Also ambien prn qhs per request of nursing staff.        1:23 AM EDP Hyacinth Meeker has been given report on this patient and will monitor overnight.  Shaaron Adler, [place] 03/14/12 [phone]

## 2012-03-13 NOTE — ED Notes (Signed)
To ED for eval of continued SOB and left arm heaviness. States this has been going on for the past few months. Pt has been to [facility] for same 3 times with no dx.

## 2012-03-13 NOTE — ED Notes (Signed)
Lab at [facility] for 3rd cardiac marker.

## 2012-03-13 NOTE — ED Notes (Signed)
Pt bawling/sobbing, verbalizes anxiousness & fear, talking on phone. Pt reassured &  Encouraged. Plan, process, results & meds explained. Anxiolytic given, sleep med ordered for HS, pt agreeable.

## 2012-03-13 NOTE — ED Notes (Signed)
Patient seems anxious.  Judeth Cornfield, [place] aware

## 2012-03-14 DIAGNOSIS — R079 Chest pain, unspecified: Secondary | ICD-10-CM

## 2012-03-14 MED ORDER — LEVOFLOXACIN IN D5W 750 MG/150ML IV SOLN: 750.0000 mg | INTRAVENOUS | Status: DC

## 2012-03-14 NOTE — ED Notes (Signed)
Pa in to talk with pt about testing and results

## 2012-03-14 NOTE — ED Notes (Signed)
PT LUNGS ARE CLEAR. SHE IS ABLE TO TALK WITH EASE AND IN COMPLETE SENTENCES. SHE DENIES PAIN OR DIFF BREATHING THIS MORNING. STATES SHE HAS THESE EPISODES THAT SHE FEELS SOMETHING IS IN THE MIDDLE OF HER CHEST AND SHE CAN ONLY TAKE PANTING LIKE BREATHS. DENIES SMOKING. HAS BEEN DOING SOME OUTDOOR ACTIVITIES.

## 2012-03-14 NOTE — ED Provider Notes (Signed)
Patient with a hx sig for HTN was placed in CDU on CP protocol by Artis Flock, PA-C. Patient care resumed from Dr. Hyacinth Meeker, Pod A .  Patient is here for Stress echo (BMI indicated).  Troponin negative x3, chest x-ray with no acute cardiac or pulmonary findings.  Patient is a low ACS risk, but do to hypertension and family history of early CAD patient was placed on protocol.  While in obeservation over night the pt slept well and had no complaints, per nursing staff. Patient re-evaluated and is resting comfortable, VSS, with no new complaints or concerns at this time. Plan per previous provider is to re-evaluate after stress tests result. On exam: hemodynamically stable, NAD, heart w/ RRR, lungs CTAB, Chest & abd non-tender, no peripheral edema or calf tenderness.  10:46 AM Stress test still pending, Pt asymptomatic w VSS   12:59 PM Study Conclusions - Procedure narrative: Target heart rate was achieved. - Stress ECG conclusions: Sinus tachycardia. No ischemic EKG changes noted. - Baseline: Normal rest wall motion. EF 55%. Mild concentric LVH. - Peak stress: Expected increase in LVEF to 65-70%. No exercise induced wall motion abnormalities noted. - Impressions: No evidence of exercise induced ischemic echocardiographic or electrocardiographic changes. Impressions: - No evidence of exercise induced ischemic echocardiographic or electrocardiographic changes. Bruce protocol. Stress echocardiography. Blood pressure: 130/86. Patient status: Observation.  Patient is to be discharged with recommendation to follow up with PCP in regards to today's hospital visit. Chest pain is not likely of cardiac or pulmonary etiology d/t presentation, perc negative, VSS, no tracheal deviation, no JVD or new murmur, RRR, breath sounds equal bilaterally, EKG without acute abnormalities, negative troponin, negative CXR & stress echo WNL. Pt has been advised start a PPI and return to the ED is CP becomes exertional, associated  with diaphoresis or nausea, radiates to left jaw/arm, worsens or becomes concerning in any way. Pt appears reliable for follow up and is agreeable to discharge.   Case has been discussed with Dr. Radford Pax who agrees with the above plan to discharge.        Jaci Carrel, [place] 03/14/12 1301

## 2012-03-14 NOTE — Discharge Instructions (Signed)
Read instructions below for reasons to return to the Emergency Department. It is recommended that your follow up with your Primary Care Doctor in regards to today's visit. If you do not have a doctor, use the resource guide listed below to help you find one.  ° °Chest Pain (Nonspecific)  °HOME CARE INSTRUCTIONS  °For the next few days, avoid physical activities that bring on chest pain. Continue physical activities as directed.  °Do not smoke cigarettes or drink alcohol until your symptoms are gone.  °Only take over-the-counter or prescription medicine for pain, discomfort, or fever as directed by your caregiver.  °Follow your caregiver's suggestions for further testing if your chest pain does not go away.  °Keep any follow-up appointments you made. If you do not go to an appointment, you could develop lasting (chronic) problems with pain. If there is any problem keeping an appointment, you must call to reschedule.  °SEEK MEDICAL CARE IF:  °You think you are having problems from the medicine you are taking. Read your medicine instructions carefully.  °Your chest pain does not go away, even after treatment.  °You develop a rash with blisters on your chest.  °SEEK IMMEDIATE MEDICAL CARE IF:  °You have increased chest pain or pain that spreads to your arm, neck, jaw, back, or belly (abdomen).  °You develop shortness of breath, an increasing cough, or you are coughing up blood.  °You have severe back or abdominal pain, feel sick to your stomach (nauseous) or throw up (vomit).  °You develop severe weakness, fainting, or chills.  °You have an oral temperature above 102° F (38.9° C), not controlled by medicine.  ° °THIS IS AN EMERGENCY. Do not wait to see if the pain will go away. Get medical help at once. Call your local emergency services (911 in [place] Do not drive yourself to the hospital. ° ° °RESOURCE GUIDE ° °Dental Problems ° °Patients with Medicaid: °[facility]                     [facility] °[address]                                           [address] °Phone:  [phone]                                                  Phone:  [phone] ° °If unable to pay or uninsured, contact:  [facility] or [facility] to become qualified for the adult dental clinic. ° °Chronic Pain Problems °Contact [facility]  [phone] °Patients need to be referred by their primary care doctor. ° °Insufficient Money for Medicine °Contact [employer] Way:  call "211" or [facility] [phone]. ° °No Primary Care Doctor °Call [facility]  832-8000 °Other agencies that provide inexpensive medical care °   [facility]  [phone] °   [facility]  [phone] °   [facility]  [phone] °   [facility] Clinic  [phone] °   [facility]  [phone] °   [facility]  [phone] ° °Psychological Services ° Health  [phone] °[facility]  [phone] °[facility]   800 853-5163 (emergency services 641-4993) ° °Substance Abuse Resources °[facility]  336-882-2125 °[facility] 336-784-9470 °[facility] 336-285-9073 °[facility] 336-845-3988 °Residential & Outpatient Substance Abuse Program  800-659-3381 ° °Abuse/Neglect °[place] Child Abuse Hotline (336) 641-3795 °[place] Child Abuse Hotline 800-378-5315 (After Hours) ° °Emergency Shelter °[facility] (336) 271-5985 ° °Maternity Homes °[facility] (336) 275-9566 °[facility] (704) 372-4663 ° °MRSA Hotline #:   832-7006 ° ° ° °[place] Resources ° °[facility] [place]     [employer]                          [facility] °[address] [place]                       [address]      [address]  °[place]                                                [place]                             [place] °Phone:  [phone]                                   Phone:  [phone]                 Phone:  [phone] ° °[facility] °Phone:  342-8316 ° °[place] Child Abuse Hotline °(336) 342-1394 °(336) 342-3537 (After Hours) ° ° °

## 2012-03-14 NOTE — ED Provider Notes (Signed)
Medical screening examination/treatment/procedure(s) were performed by non-physician practitioner and as supervising physician I was immediately available for consultation/collaboration.  Return visit for L sided chest heaviness, now radiating into L arm.  Several episodes over past week, lasing hours at a time.  PERC negative.  TIMI 0.  Glynn Octave, MD 03/14/12 [phone]

## 2012-03-14 NOTE — ED Notes (Signed)
PT UP TO RESTROOM TO WASH UP BEFORE TESTING

## 2012-03-15 ENCOUNTER — Encounter (HOSPITAL_COMMUNITY): Payer: Self-pay | Admitting: Emergency Medicine

## 2012-05-03 ENCOUNTER — Emergency Department (HOSPITAL_COMMUNITY)
Admission: EM | Admit: 2012-05-03 | Discharge: 2012-05-03 | Disposition: A | Discharge status: Home / Self Care | Payer: Medicaid - Out of State | Attending: Emergency Medicine | Admitting: Emergency Medicine

## 2012-05-03 ENCOUNTER — Encounter (HOSPITAL_COMMUNITY): Payer: Self-pay | Admitting: *Deleted

## 2012-05-03 DIAGNOSIS — F411 Generalized anxiety disorder: Secondary | ICD-10-CM | POA: Insufficient documentation

## 2012-05-03 DIAGNOSIS — K219 Gastro-esophageal reflux disease without esophagitis: Secondary | ICD-10-CM | POA: Insufficient documentation

## 2012-05-03 DIAGNOSIS — I10 Essential (primary) hypertension: Secondary | ICD-10-CM | POA: Insufficient documentation

## 2012-05-03 DIAGNOSIS — N9489 Other specified conditions associated with female genital organs and menstrual cycle: Secondary | ICD-10-CM | POA: Insufficient documentation

## 2012-05-03 DIAGNOSIS — R102 Pelvic and perineal pain: Secondary | ICD-10-CM

## 2012-05-03 LAB — URINALYSIS, ROUTINE W REFLEX MICROSCOPIC
Glucose, UA: NEGATIVE mg/dL
Leukocytes, UA: NEGATIVE
Nitrite: NEGATIVE
Specific Gravity, Urine: 1.026 (ref 1.005–1.030)
pH: 6.5 (ref 5.0–8.0)

## 2012-05-03 LAB — WET PREP, GENITAL: Trich, Wet Prep: NONE SEEN

## 2012-05-03 LAB — CBC
HCT: 42.5 % (ref 36.0–46.0)
Hemoglobin: 14.5 g/dL (ref 12.0–15.0)
RBC: 5.06 MIL/uL (ref 3.87–5.11)
WBC: 5.8 10*3/uL (ref 4.0–10.5)

## 2012-05-03 LAB — BASIC METABOLIC PANEL
BUN: 11 mg/dL (ref 6–23)
CO2: 25 mEq/L (ref 19–32)
Chloride: 101 mEq/L (ref 96–112)
GFR calc non Af Amer: >90 mL/min (ref >90)
Glucose, Bld: 149 mg/dL — ABNORMAL HIGH (ref 70–99)
Potassium: 3.7 mEq/L (ref 3.5–5.1)
Sodium: 137 mEq/L (ref 135–145)

## 2012-05-03 MED ORDER — NALOXONE HCL 0.4 MG/ML IJ SOLN
0.4000 mg | Freq: Once | INTRAMUSCULAR | Status: AC
  Administered 2012-05-03: 0.4 mg via INTRAVENOUS

## 2012-05-03 MED ORDER — ONDANSETRON HCL 4 MG/2ML IJ SOLN
4.0000 mg | Freq: Once | INTRAMUSCULAR | Status: AC
  Administered 2012-05-03: 4 mg via INTRAVENOUS
  Filled 2012-05-03: qty 2

## 2012-05-03 MED ORDER — HYDROMORPHONE HCL PF 1 MG/ML IJ SOLN
1.0000 mg | Freq: Once | INTRAMUSCULAR | Status: AC
  Administered 2012-05-03: 1 mg via INTRAVENOUS
  Filled 2012-05-03: qty 1

## 2012-05-03 MED ORDER — OXYCODONE-ACETAMINOPHEN 5-325 MG PO TABS: 1.0000 | ORAL_TABLET | ORAL | Status: AC | PRN

## 2012-05-03 MED ORDER — NALOXONE HCL 0.4 MG/ML IJ SOLN
INTRAMUSCULAR | Status: AC
  Administered 2012-05-03: 0.4 mg via INTRAVENOUS
  Filled 2012-05-03: qty 1

## 2012-05-03 NOTE — Discharge Instructions (Signed)
Abdominal Pain, Women Abdominal (stomach, pelvic, or belly) pain can be caused by many things. It is important to tell your doctor:  The location of the pain.   Does it come and go or is it present all the time?   Are there things that start the pain (eating certain foods, exercise)?   Are there other symptoms associated with the pain (fever, nausea, vomiting, diarrhea)?  All of this is helpful to know when trying to find the cause of the pain. CAUSES   Stomach: virus or bacteria infection, or ulcer.   Intestine: appendicitis (inflamed appendix), regional ileitis (Crohn's disease), ulcerative colitis (inflamed colon), irritable bowel syndrome, diverticulitis (inflamed diverticulum of the colon), or cancer of the stomach or intestine.   Gallbladder disease or stones in the gallbladder.   Kidney disease, kidney stones, or infection.   Pancreas infection or cancer.   Fibromyalgia (pain disorder).   Diseases of the female organs:   Uterus: fibroid (non-cancerous) tumors or infection.   Fallopian tubes: infection or tubal pregnancy.   Ovary: cysts or tumors.   Pelvic adhesions (scar tissue).   Endometriosis (uterus lining tissue growing in the pelvis and on the pelvic organs).   Pelvic congestion syndrome (female organs filling up with blood just before the menstrual period).   Pain with the menstrual period.   Pain with ovulation (producing an egg).   Pain with an IUD (intrauterine device, birth control) in the uterus.   Cancer of the female organs.   Functional pain (pain not caused by a disease, may improve without treatment).   Psychological pain.   Depression.  DIAGNOSIS  Your doctor will decide the seriousness of your pain by doing an examination.  Blood tests.   X-rays.   Ultrasound.   CT scan (computed tomography, special type of X-ray).   MRI (magnetic resonance imaging).   Cultures, for infection.   Barium enema (dye inserted in the large  intestine, to better view it with X-rays).   Colonoscopy (looking in intestine with a lighted tube).   Laparoscopy (minor surgery, looking in abdomen with a lighted tube).   Major abdominal exploratory surgery (looking in abdomen with a large incision).  TREATMENT  The treatment will depend on the cause of the pain.   Many cases can be observed and treated at home.   Over-the-counter medicines recommended by your caregiver.   Prescription medicine.   Antibiotics, for infection.   Birth control pills, for painful periods or for ovulation pain.   Hormone treatment, for endometriosis.   Nerve blocking injections.   Physical therapy.   Antidepressants.   Counseling with a psychologist or psychiatrist.   Minor or major surgery.  HOME CARE INSTRUCTIONS   Do not take laxatives, unless directed by your caregiver.   Take over-the-counter pain medicine only if ordered by your caregiver. Do not take aspirin because it can cause an upset stomach or bleeding.   Try a clear liquid diet (broth or water) as ordered by your caregiver. Slowly move to a bland diet, as tolerated, if the pain is related to the stomach or intestine.   Have a thermometer and take your temperature several times a day, and record it.   Bed rest and sleep, if it helps the pain.   Avoid sexual intercourse, if it causes pain.   Avoid stressful situations.   Keep your follow-up appointments and tests, as your caregiver orders.   If the pain does not go away with medicine or surgery, you may   try:   Acupuncture.   Relaxation exercises (yoga, meditation).   Group therapy.   Counseling.  SEEK MEDICAL CARE IF:   You notice certain foods cause stomach pain.   Your home care treatment is not helping your pain.   You need stronger pain medicine.   You want your IUD removed.   You feel faint or lightheaded.   You develop nausea and vomiting.   You develop a rash.   You are having side effects or  an allergy to your medicine.  SEEK IMMEDIATE MEDICAL CARE IF:   Your pain does not go away or gets worse.   You have a fever.   Your pain is felt only in portions of the abdomen. The right side could possibly be appendicitis. The left lower portion of the abdomen could be colitis or diverticulitis.   You are passing blood in your stools (bright red or black tarry stools, with or without vomiting).   You have blood in your urine.   You develop chills, with or without a fever.   You pass out.  MAKE SURE YOU:   Understand these instructions.   Will watch your condition.   Will get help right away if you are not doing well or get worse.  Document Released: 09/05/2007 Document Revised: 10/28/2011 Document Reviewed: 09/25/2009 [employer] Patient Information 2012 [employer]. 

## 2012-05-03 NOTE — ED Notes (Signed)
Pt was c/o "feeling weird" after administration of dilaudid.  Zofran was given for nausea and pt was told that narcotic pain medication can cause a dizzy/warm sensation that she described.  Dr. Patria Mane made aware.  Pt continued to complain of this feeling and then began having trouble maintaining her 02 sats.  Getting down to 77% at one point.  Pt placed on 2 L [place].  Dr. Patria Mane made aware. Order for 0.4 mg Narcan.  Pt states that she feels better after Narcan administration.  HR 72 02 sat 98% on 2 L [place].  111/61.  RR 20.

## 2012-05-03 NOTE — ED Notes (Signed)
Lower abd pain for months, no discharge or vaginal symptoms, painful protected intercourse 2 weeks ago. No nausea or vomiting, occ urinary freq

## 2012-05-03 NOTE — ED Notes (Signed)
Pt c/o feeling "hot" and "dizzy" after 1mg  Dilaudid - vitals stable, cool wash cloth given, will continue to assess pt.

## 2012-05-03 NOTE — ED Provider Notes (Signed)
History     CSN: [id number]  Arrival date & time 05/03/12  1116   First MD Initiated Contact with Patient 05/03/12 1135      Chief Complaint  Patient presents with  . Abdominal Pain     The history is provided by the patient.   the patient reports occasional lower abdominal pain for months.  She reports her abdominal pain is becoming more frequent.  When her pain comes it comes on for several hours and it subsides.  Recently she had extremely painful intercourse 2 weeks ago.  She reports this is the penis was entered and her vagina cause severe pain to the point that it had to be withdrawn.  Denies vaginal bleeding.  She reports partial abdominal hysterectomy where the physician left her ovaries and her cervix but to cut her uterus for what sounds like a large fibroid.  She denies fevers or chills.  She has no nausea vomiting or diarrhea.  Nothing improves or worsens her symptoms.  Past Medical History  Diagnosis Date  . Anxiety   . Panic attack   . GERD (gastroesophageal reflux disease)   . Hypertension     Past Surgical History  Procedure Date  . Partial hysterectomy   . Laproscopic knee surgery   . Cesarean section   . Abdominal hysterectomy   . Joint replacement     Family History  Problem Relation Age of Onset  . Diabetes Mother   . Hypertension Other     History  Substance Use Topics  . Smoking status: Never Smoker   . Smokeless tobacco: Not on file  . Alcohol Use: No     occassioanl    OB History    Grav Para Term Preterm Abortions TAB SAB Ect Mult Living                  Review of Systems  Gastrointestinal: Positive for abdominal pain.  All other systems reviewed and are negative.    Allergies  Penicillins and Penicillins  Home Medications   Current Outpatient Rx  Name Route Sig Dispense Refill  . ACETAMINOPHEN 500 MG PO TABS Oral Take 1,000 mg by mouth every 4 (four) hours as needed. For shoulder pain    . INDAPAMIDE 2.5 MG PO TABS Oral  Take 2.5 mg by mouth daily.    . OXYCODONE-ACETAMINOPHEN 5-325 MG PO TABS Oral Take 1 tablet by mouth every 4 (four) hours as needed for pain. 25 tablet 0    BP 111/61  Pulse 74  Temp 98.4 F (36.9 C)  Resp 20  SpO2 98%  Physical Exam  Nursing note and vitals reviewed. Constitutional: She is oriented to person, place, and time. She appears well-developed and well-nourished. No distress.  HENT:  Head: Normocephalic and atraumatic.  Eyes: EOM are normal.  Neck: Normal range of motion.  Cardiovascular: Normal rate, regular rhythm and normal heart sounds.   Pulmonary/Chest: Effort normal and breath sounds normal.  Abdominal: Soft. She exhibits no distension. There is no tenderness.  Genitourinary:       Normal external genitalia.  No abnormalities noted around the urethra.  Normal vaginal introitus.  No cervical motion tenderness or cervical discharge.  No obvious vaginal discharge.  No bleeding noted  Musculoskeletal: Normal range of motion.  Neurological: She is alert and oriented to person, place, and time.  Skin: Skin is warm and dry.  Psychiatric: She has a normal mood and affect. Judgment normal.    ED Course  Procedures (including critical care time)  Labs Reviewed  BASIC METABOLIC PANEL - Abnormal; Notable for the following:    Glucose, Bld 149 (*)     All other components within normal limits  WET PREP, GENITAL - Abnormal; Notable for the following:    Yeast Wet Prep HPF POC FEW (*)     Clue Cells Wet Prep HPF POC MANY (*)     WBC, Wet Prep HPF POC FEW (*)     All other components within normal limits  URINALYSIS, ROUTINE W REFLEX MICROSCOPIC  CBC  GC/CHLAMYDIA PROBE AMP, GENITAL   No results found.   1. Pelvic pain       MDM  I don't  have a clear etiology at this time for the patient's lower pelvic pain.  Doesn't seem to be of cervicitis.  She's had her uterus removed.  Her abdomen is benign.  Her urine is normal.  Her wet prep is nonspecific at this time  doubly that she has bacterial vaginosis.  She may require additional imaging as an outpatient at this time I don't believe an ultrasound or CT imaging wax leg demonstrate pathology that needs emergent followup today and thus I will defer this to the OB/GYN.  Given that her pain is worse with intercourse I will defer ongoing studies to the gynecologist.  The patient understands to return the emergency apartment for new or worsening symptoms.  Home with short course of Percocet for pain       Lyanne Co, MD 05/03/12 1506

## 2012-05-04 LAB — GC/CHLAMYDIA PROBE AMP, GENITAL: Chlamydia, DNA Probe: NEGATIVE

## 2012-05-24 ENCOUNTER — Encounter: Payer: Self-pay | Admitting: Family Medicine

## 2012-05-24 ENCOUNTER — Ambulatory Visit (INDEPENDENT_AMBULATORY_CARE_PROVIDER_SITE_OTHER): Payer: Self-pay | Admitting: Family Medicine

## 2012-05-24 VITALS — BP 126/85 | HR 91 | Temp 98.4°F | Ht 65.75 in | Wt 268.0 lb

## 2012-05-24 DIAGNOSIS — M25561 Pain in right knee: Secondary | ICD-10-CM | POA: Insufficient documentation

## 2012-05-24 DIAGNOSIS — N949 Unspecified condition associated with female genital organs and menstrual cycle: Secondary | ICD-10-CM

## 2012-05-24 DIAGNOSIS — I10 Essential (primary) hypertension: Secondary | ICD-10-CM | POA: Insufficient documentation

## 2012-05-24 DIAGNOSIS — F419 Anxiety disorder, unspecified: Secondary | ICD-10-CM | POA: Insufficient documentation

## 2012-05-24 DIAGNOSIS — M25569 Pain in unspecified knee: Secondary | ICD-10-CM

## 2012-05-24 DIAGNOSIS — M25562 Pain in left knee: Secondary | ICD-10-CM | POA: Insufficient documentation

## 2012-05-24 DIAGNOSIS — R102 Pelvic and perineal pain: Secondary | ICD-10-CM

## 2012-05-24 MED ORDER — MELOXICAM 15 MG PO TABS: 15.0000 mg | ORAL_TABLET | Freq: Every day | ORAL | Status: DC

## 2012-05-24 MED ORDER — INDAPAMIDE 2.5 MG PO TABS: 2.5000 mg | ORAL_TABLET | Freq: Every day | ORAL | Status: DC

## 2012-05-24 NOTE — Assessment & Plan Note (Signed)
A: well controlled on lozol.  Med: compliant. P: -check CMP -screen for diabetes with fasting glucose -screen for hyperlipidemia with fasting LDL.

## 2012-05-24 NOTE — Patient Instructions (Addendum)
Carolyn Holland,  Thank you very much for coming in to see me  Today.  1. For your pelvica pain: come back to give a urine sample. Drink plenty of water. 2. For your knee pain: star mobic once daily, continue tylenol 1000 mg up to every 4 hours. Exercise as tolerated, low impact (water). Low carb, low fat, high lean protein, high veggie diet.   Call and come in for labs and urine sample at your earliest convenience. Plan to see me in a few week.  I will call or send a letter with your blood work results.  Dr. Armen Pickup

## 2012-05-24 NOTE — Assessment & Plan Note (Signed)
A: multifactorial from obesity and likely meniscal injury. P: -weight loss -increase NSAID to mobic daily -continue tylenol -plan imaging with plain films and likely MRI once pt has insurance.

## 2012-05-24 NOTE — Progress Notes (Signed)
Subjective:     Patient ID: Carolyn Holland, female   DOB: 1971/04/16, 41 y.o.   MRN: [id number]  HPI 41 yo F new patient presents to establish primary care and discuss the following:  1. Bilateral knee pain: exacerbated by activity like dancing and working out. Patient denies swelling. Pain radiated to mid shin. She takes tylenol, motrin and aspirin for pain with only mild and temporary relief of symptoms.   2. Low pelvic pain: 4/10. Present x 2 months.  Associated with increased urinary frequency. Denies dysuria.   Health maintenance: last pap in 2011. No history of abnormal pap smears. Mammogram 2012 normal.  Past Medical History  Diagnosis Date  . Anxiety 2012  . Panic attack 2012    situational related to move from [place].   [name] GERD (gastroesophageal reflux disease) 2000    come and go  . Hypertension 2008  . Sleep apnea 2012   Past Surgical History  Procedure Date  . Partial hysterectomy 2009  . Laproscopic knee surgery 2011    L knee for bilateral meniscal tear. Following acute injury.   . Abdominal hysterectomy   . Cesarean section 2002   Family History  Problem Relation Age of Onset  . Diabetes Mother   . Hypertension Mother   . Hypertension Father   . Heart disease Mother 50  . Stroke Mother 40    x 2  . Alcohol abuse Mother     previous   . Drug abuse Mother     previous cocaine    History   Social History  . Marital Status: Single    Spouse Name: N/A    Number of Children: 3  . Years of Education: assoc. deg   Occupational History  . Unemployed     Social History Main Topics  . Smoking status: Never Smoker   . Smokeless tobacco: Never Used  . Alcohol Use: Yes     occassional, social   . Drug Use: No  . Sexually Active: Not Currently -- Female partner(s)    Birth Control/ Protection: Condom, Surgical   Other Topics Concern  . Not on file   Social History Narrative   Live with two children 16 and 11.     Review of Systems  Constitutional:  Negative.   HENT: Negative.   Eyes: Negative.   Respiratory: Positive for apnea. Negative for cough, choking, chest tightness, shortness of breath, wheezing and stridor.   Cardiovascular: Negative for chest pain, palpitations and leg swelling.  Gastrointestinal: Positive for constipation and blood in stool. Negative for nausea, vomiting, abdominal pain, diarrhea, abdominal distention, anal bleeding and rectal pain.       External hemorrhoid  Genitourinary: Positive for frequency.  Musculoskeletal: Positive for myalgias and arthralgias. Negative for back pain, joint swelling and gait problem.  Skin: Negative.   Neurological: Negative.   Hematological: Negative.   Psychiatric/Behavioral: Negative for hallucinations, behavioral problems, confusion, disturbed wake/sleep cycle, self-injury, dysphoric mood, decreased concentration and agitation. The patient is nervous/anxious. The patient is not hyperactive.    Objective:   Physical Exam BP 126/85  Pulse 91  Temp 98.4 F (36.9 C) (Oral)  Ht 5' 5.75" (1.67 m)  Wt 268 lb (121.564 kg)  BMI 43.59 kg/m2 General appearance: alert, cooperative and moderately obese Lungs: clear to auscultation bilaterally Heart: regular rate and rhythm, S1, S2 normal, no murmur, click, rub or gallop Abdomen: soft, non-tender; bowel sounds normal; no masses,  no organomegaly Pelvic: cervix normal in appearance, external genitalia  normal, no adnexal masses or tenderness, no cervical motion tenderness, positive findings: rectal hemorrhoid, rectovaginal septum normal, uterus normal size, shape, and consistency and vagina normal without discharge Extremities: extremities normal, atraumatic, no cyanosis or edema Pulses: 2+ and symmetric Neurologic: Grossly normal Knee: Normal to inspection with no erythema or effusion or obvious bony abnormalities. Palpation with patellar tenderness  no warmth or joint line tenderness or condyle tenderness. ROM normal in flexion and  extension and lower leg rotation. Ligaments with popping in R medial meniscus and lateral meniscus.  Negative Mcmurray's and provocative meniscal tests. Painful patellar compression. Patellar and quadriceps tendons unremarkable. Hamstring and quadriceps strength is normal.  Assessment and Plan:

## 2012-06-07 ENCOUNTER — Telehealth: Payer: Self-pay | Admitting: Family Medicine

## 2012-06-07 NOTE — Telephone Encounter (Signed)
Patient needs to talk to you about her having some bleeding and she had a partial hysterectomy 4 years ago.  She would like you to give her a call.

## 2012-06-09 NOTE — Telephone Encounter (Signed)
Left message with the patient's daughter. Asked patient daughter to inform her that the best thing to do is to call back and make an appt. Patient's daughter voiced understanding.

## 2012-06-21 ENCOUNTER — Ambulatory Visit: Payer: Self-pay | Admitting: Family Medicine

## 2012-12-22 ENCOUNTER — Ambulatory Visit (HOSPITAL_COMMUNITY)
Admission: RE | Admit: 2012-12-22 | Discharge: 2012-12-22 | Disposition: A | Discharge status: Home / Self Care | Payer: 59 | Source: Ambulatory Visit | Attending: Family Medicine | Admitting: Family Medicine

## 2012-12-22 ENCOUNTER — Encounter: Payer: Self-pay | Admitting: Family Medicine

## 2012-12-22 ENCOUNTER — Ambulatory Visit: Payer: Self-pay | Admitting: Family Medicine

## 2012-12-22 ENCOUNTER — Ambulatory Visit (INDEPENDENT_AMBULATORY_CARE_PROVIDER_SITE_OTHER): Payer: 59 | Admitting: Family Medicine

## 2012-12-22 VITALS — BP 119/83 | HR 74 | Temp 98.3°F | Ht 65.75 in | Wt 273.0 lb

## 2012-12-22 DIAGNOSIS — M79606 Pain in leg, unspecified: Secondary | ICD-10-CM

## 2012-12-22 DIAGNOSIS — Z8639 Personal history of other endocrine, nutritional and metabolic disease: Secondary | ICD-10-CM

## 2012-12-22 DIAGNOSIS — R52 Pain, unspecified: Secondary | ICD-10-CM

## 2012-12-22 DIAGNOSIS — D649 Anemia, unspecified: Secondary | ICD-10-CM | POA: Insufficient documentation

## 2012-12-22 DIAGNOSIS — E559 Vitamin D deficiency, unspecified: Secondary | ICD-10-CM | POA: Insufficient documentation

## 2012-12-22 DIAGNOSIS — R079 Chest pain, unspecified: Secondary | ICD-10-CM | POA: Insufficient documentation

## 2012-12-22 DIAGNOSIS — M542 Cervicalgia: Secondary | ICD-10-CM

## 2012-12-22 DIAGNOSIS — I10 Essential (primary) hypertension: Secondary | ICD-10-CM

## 2012-12-22 DIAGNOSIS — M79609 Pain in unspecified limb: Secondary | ICD-10-CM

## 2012-12-22 HISTORY — DX: Anemia, unspecified: D64.9

## 2012-12-22 LAB — CBC
Hemoglobin: 15 g/dL (ref 12.0–15.0)
MCH: 28.7 pg (ref 26.0–34.0)
MCHC: 34.9 g/dL (ref 30.0–36.0)
MCV: 82.4 fL (ref 78.0–100.0)
RBC: 5.22 MIL/uL — ABNORMAL HIGH (ref 3.87–5.11)

## 2012-12-22 LAB — COMPREHENSIVE METABOLIC PANEL
ALT: 11 U/L (ref 0–35)
Alkaline Phosphatase: 35 U/L — ABNORMAL LOW (ref 39–117)
CO2: 25 mEq/L (ref 19–32)
Creat: 0.8 mg/dL (ref 0.50–1.10)
Total Bilirubin: 0.8 mg/dL (ref 0.3–1.2)

## 2012-12-22 LAB — LIPID PANEL
Cholesterol: 227 mg/dL — ABNORMAL HIGH (ref 0–200)
HDL: 70 mg/dL (ref >39)
LDL Cholesterol: 132 mg/dL — ABNORMAL HIGH (ref 0–99)
Total CHOL/HDL Ratio: 3.2 Ratio
Triglycerides: 124 mg/dL (ref <150)
VLDL: 25 mg/dL (ref 0–40)

## 2012-12-22 LAB — D-DIMER, QUANTITATIVE: D-Dimer, Quant: 0.23 ug/mL-FEU (ref 0.00–0.48)

## 2012-12-22 LAB — TSH: TSH: 0.789 u[IU]/mL (ref 0.350–4.500)

## 2012-12-22 LAB — VITAMIN B12: Vitamin B-12: 400 pg/mL (ref 211–911)

## 2012-12-22 MED ORDER — MELOXICAM 15 MG PO TABS: 15.0000 mg | ORAL_TABLET | Freq: Every day | ORAL | Status: DC

## 2012-12-22 NOTE — Patient Instructions (Signed)
I am sorry you are not feeling well today.  Please take the Mobic every day for one week to see if this helps your neck pain.  Then, use it as needed.  I will send you a letter with your lab results, or call you if anything is abnormal.    Please see the hand out with attached neck stretches/exercises.   Please make an appointment to see Dr. Armen Pickup in one week to see how you are doing.

## 2012-12-24 ENCOUNTER — Emergency Department (HOSPITAL_COMMUNITY)
Admission: EM | Admit: 2012-12-24 | Discharge: 2012-12-25 | Disposition: A | Discharge status: Home / Self Care | Payer: 59 | Attending: Emergency Medicine | Admitting: Emergency Medicine

## 2012-12-24 ENCOUNTER — Encounter (HOSPITAL_COMMUNITY): Payer: Self-pay | Admitting: Emergency Medicine

## 2012-12-24 ENCOUNTER — Other Ambulatory Visit: Payer: Self-pay

## 2012-12-24 ENCOUNTER — Emergency Department (HOSPITAL_COMMUNITY): Payer: 59

## 2012-12-24 DIAGNOSIS — M542 Cervicalgia: Secondary | ICD-10-CM | POA: Insufficient documentation

## 2012-12-24 DIAGNOSIS — I10 Essential (primary) hypertension: Secondary | ICD-10-CM | POA: Insufficient documentation

## 2012-12-24 DIAGNOSIS — Z8659 Personal history of other mental and behavioral disorders: Secondary | ICD-10-CM | POA: Insufficient documentation

## 2012-12-24 DIAGNOSIS — Z8719 Personal history of other diseases of the digestive system: Secondary | ICD-10-CM | POA: Insufficient documentation

## 2012-12-24 DIAGNOSIS — F411 Generalized anxiety disorder: Secondary | ICD-10-CM | POA: Insufficient documentation

## 2012-12-24 DIAGNOSIS — R0602 Shortness of breath: Secondary | ICD-10-CM | POA: Insufficient documentation

## 2012-12-24 DIAGNOSIS — F419 Anxiety disorder, unspecified: Secondary | ICD-10-CM

## 2012-12-24 DIAGNOSIS — Z791 Long term (current) use of non-steroidal anti-inflammatories (NSAID): Secondary | ICD-10-CM | POA: Insufficient documentation

## 2012-12-24 DIAGNOSIS — M5412 Radiculopathy, cervical region: Secondary | ICD-10-CM | POA: Insufficient documentation

## 2012-12-24 DIAGNOSIS — R0789 Other chest pain: Secondary | ICD-10-CM | POA: Insufficient documentation

## 2012-12-24 DIAGNOSIS — Z79899 Other long term (current) drug therapy: Secondary | ICD-10-CM | POA: Insufficient documentation

## 2012-12-24 LAB — BASIC METABOLIC PANEL
BUN: 16 mg/dL (ref 6–23)
Calcium: 9.1 mg/dL (ref 8.4–10.5)
GFR calc non Af Amer: >90 mL/min (ref >90)
Glucose, Bld: 142 mg/dL — ABNORMAL HIGH (ref 70–99)

## 2012-12-24 LAB — CBC WITH DIFFERENTIAL/PLATELET
Eosinophils Absolute: 0.2 10*3/uL (ref 0.0–0.7)
Eosinophils Relative: 3 % (ref 0–5)
HCT: 39.7 % (ref 36.0–46.0)
Hemoglobin: 13.4 g/dL (ref 12.0–15.0)
Lymphs Abs: 2.8 10*3/uL (ref 0.7–4.0)
MCH: 28.6 pg (ref 26.0–34.0)
MCV: 84.6 fL (ref 78.0–100.0)
Monocytes Absolute: 0.5 10*3/uL (ref 0.1–1.0)
Monocytes Relative: 8 % (ref 3–12)
RBC: 4.69 MIL/uL (ref 3.87–5.11)

## 2012-12-24 MED ORDER — CYCLOBENZAPRINE HCL 10 MG PO TABS: 10.0000 mg | ORAL_TABLET | Freq: Two times a day (BID) | ORAL | Status: DC | PRN

## 2012-12-24 MED ORDER — DEXAMETHASONE SODIUM PHOSPHATE 10 MG/ML IJ SOLN
10.0000 mg | Freq: Once | INTRAMUSCULAR | Status: AC
  Administered 2012-12-24: 10 mg via INTRAMUSCULAR
  Filled 2012-12-24: qty 1

## 2012-12-24 MED ORDER — KETOROLAC TROMETHAMINE 60 MG/2ML IM SOLN
60.0000 mg | Freq: Once | INTRAMUSCULAR | Status: AC
  Administered 2012-12-24: 60 mg via INTRAMUSCULAR
  Filled 2012-12-24: qty 2

## 2012-12-24 NOTE — Assessment & Plan Note (Signed)
Check Vit d.   

## 2012-12-24 NOTE — Assessment & Plan Note (Signed)
Good control while taking medication- unclear if it was high a few days ago.  Will check BMET, and for neck pain will only schedule nsaids x 1 week due to HTN.

## 2012-12-24 NOTE — ED Notes (Addendum)
Pt states that she began having an intermittent chest pain last night. She describes the pain as "a poking or sticking" pain. Pt states that nothing makes the pain better or worse. Denies n/v/d, diaphoresis, or radiating of the pain. Pt states she is "feeling a little SOB." Pt in NAD, skin pwd. Pt has been having pain in shoulders that radiates to her head. Saw MD on Friday for this.

## 2012-12-24 NOTE — Progress Notes (Signed)
  Subjective:    Patient ID: Carolyn Holland, female    DOB: 09-28-1971, 42 y.o.   MRN: [id number]  HPI  Ms. Whipple comes in with multiple difficult to describe complaints.  She is anxious and speaking rapidly today.   She has neck pain that starts in her neck (bilaterally in the muscles) then moves up into her scalp and she has a burning tingling pain in scalp.  Has had migraines in the past but this is different.  She had been off of her BP medication for a few days and that is when this started.  She started taking it again about 3 days ago, but today it is the worst.  She says she is afraid she is dying. On ROS she admits to a chest pain on the left side, and some shortness of breath when she feels anxious.  Neither of these are worsened by exertion or associated with nausea or diaphoresis.  She also complains of pain in her legs.  She has knee pain, but also has pain that is burning and tingling in her lower legs.  It is not worsened by walking or alleviated by rest.  She also has knee pain which is worse. She had a cousin who just died of a blood clot so she is very nervous about this.    Past Medical History  Diagnosis Date  . GERD (gastroesophageal reflux disease) 2000    come and go  . Sleep apnea 2012  . Anxiety 2012  . Panic attack 2012    situational related to move from [place].   [name] Hypertension 2008  . Bilateral anterior knee pain 2012   Family History  Problem Relation Age of Onset  . Diabetes Mother   . Hypertension Mother   . Hypertension Father   . Heart disease Mother 61  . Stroke Mother 40    x 2  . Alcohol abuse Mother     previous   . Drug abuse Mother     previous cocaine    History  Substance Use Topics  . Smoking status: Never Smoker   . Smokeless tobacco: Never Used  . Alcohol Use: Yes     Comment: occassional, social    Review of Systems See HPI    Objective:   Physical Exam BP 119/83  Pulse 74  Temp 98.3 F (36.8 C) (Oral)  Ht 5' 5.75" (1.67  m)  Wt 273 lb (123.832 kg)  BMI 44.40 kg/m2 General appearance: alert and cooperative, anxious Neck: no adenopathy, no JVD, supple, symmetrical, trachea midline and thyroid not enlarged, symmetric, no tenderness/mass/nodules Lungs: clear to auscultation bilaterally Heart: regular rate and rhythm, S1, S2 normal, no murmur, click, rub or gallop Abdomen: soft, non-tender; bowel sounds normal; no masses,  no organomegaly Extremities: extremities normal, atraumatic, no cyanosis or edema and Homans sign is negative, no sign of DVT Pulses: 2+ and symmetric  ECG: NSR, no abnormalities.      Assessment & Plan:

## 2012-12-24 NOTE — Assessment & Plan Note (Signed)
I feel this is a neck strain with nerve irritation +/- complicated migraine.  ECG WNL, patient low risk for cardiac etiology.  Gave hand out for neck stretches/exercieses, schedule meloxicam x 1 week. F/U w/ Dr. Armen Pickup in one week.

## 2012-12-24 NOTE — Assessment & Plan Note (Signed)
Will check hemoglobin to make sure this is not contributing to her symptoms in her legs.

## 2012-12-24 NOTE — Assessment & Plan Note (Signed)
Of legs, will check D-dimer but I have low suspicion of DVT.  Will check BMET, B12,

## 2012-12-24 NOTE — ED Provider Notes (Signed)
History     CSN: [id number]  Arrival date & time 12/24/12  2116   First MD Initiated Contact with Patient 12/24/12 2139      Chief Complaint  Patient presents with  . Chest Pain  . Shortness of Breath    (Consider location/radiation/quality/duration/timing/severity/associated sxs/prior treatment) HPI Comments: Patient presents to the ER with complaints of chest pain. She has been experiencing neck pain bilaterally radiating to the shoulders and the back of the head for a week. She was seen by her doctor this week and placed on Mobic. She reports that the pain improved with Mobic. She has become concerned, however, because yesterday she had some discomfort in her chest and shortness of breath. She is concerned because she does Mobic can cause heart attacks and strokes. She did not take any Mobic today. Pain is nonexertional. Patient has had previous cardiac workups which were negative. She reports that her cousin died this week and she has been anxious about this.  Patient is a 42 y.o. female presenting with chest pain and shortness of breath.  Chest Pain Primary symptoms include shortness of breath.    Shortness of Breath  Associated symptoms include chest pain and shortness of breath.    Past Medical History  Diagnosis Date  . GERD (gastroesophageal reflux disease) 2000    come and go  . Sleep apnea 2012  . Anxiety 2012  . Panic attack 2012    situational related to move from [place].   [name] Hypertension 2008  . Bilateral anterior knee pain 2012    Past Surgical History  Procedure Date  . Partial hysterectomy 2009  . Laproscopic knee surgery 2011    L knee for bilateral meniscal tear. Following acute injury.   . Abdominal hysterectomy   . Cesarean section 2002    Family History  Problem Relation Age of Onset  . Diabetes Mother   . Hypertension Mother   . Hypertension Father   . Heart disease Mother 50  . Stroke Mother 40    x 2  . Alcohol abuse Mother    previous   . Drug abuse Mother     previous cocaine     History  Substance Use Topics  . Smoking status: Never Smoker   . Smokeless tobacco: Never Used  . Alcohol Use: Yes     Comment: occassional, social     OB History    Grav Para Term Preterm Abortions TAB SAB Ect Mult Living   4 3 3  1 1    3       Review of Systems  Respiratory: Positive for shortness of breath.   Cardiovascular: Positive for chest pain.  Psychiatric/Behavioral: The patient is nervous/anxious.   All other systems reviewed and are negative.    Allergies  Penicillins and Penicillins  Home Medications   Current Outpatient Rx  Name  Route  Sig  Dispense  Refill  . ACETAMINOPHEN 500 MG PO TABS   Oral   Take 1,000 mg by mouth every 4 (four) hours as needed. For shoulder pain         . INDAPAMIDE 2.5 MG PO TABS   Oral   Take 1 tablet (2.5 mg total) by mouth daily.   30 tablet   11   . MELOXICAM 15 MG PO TABS   Oral   Take 1 tablet (15 mg total) by mouth daily.   30 tablet   1     BP 149/85  Pulse 88  Temp 97.7 F (36.5 C) (Oral)  Resp 23  SpO2 99%  Physical Exam  Constitutional: She is oriented to person, place, and time. She appears well-developed and well-nourished. She appears distressed.  HENT:  Head: Normocephalic and atraumatic.  Right Ear: Hearing normal.  Nose: Nose normal.  Mouth/Throat: Oropharynx is clear and moist and mucous membranes are normal.  Eyes: Conjunctivae normal and EOM are normal. Pupils are equal, round, and reactive to light.  Neck: Normal range of motion. Neck supple.  Cardiovascular: Normal rate, regular rhythm, S1 normal and S2 normal.  Exam reveals no gallop and no friction rub.   No murmur heard. Pulmonary/Chest: Effort normal and breath sounds normal. No respiratory distress. She exhibits no tenderness.  Abdominal: Soft. Normal appearance and bowel sounds are normal. There is no hepatosplenomegaly. There is no tenderness. There is no rebound, no  guarding, no tenderness at McBurney's point and negative Murphy's sign. No hernia.  Musculoskeletal: Normal range of motion.  Neurological: She is alert and oriented to person, place, and time. She has normal strength. No cranial nerve deficit or sensory deficit. Coordination normal. GCS eye subscore is 4. GCS verbal subscore is 5. GCS motor subscore is 6.  Skin: Skin is warm, dry and intact. No rash noted. No cyanosis.  Psychiatric: Her speech is normal and behavior is normal. Thought content normal. Her mood appears anxious.    ED Course  Procedures (including critical care time)   Date: 12/24/2012  Rate: 80  Rhythm: normal sinus rhythm  QRS Axis: normal  Intervals: normal  ST/T Wave abnormalities: normal  Conduction Disutrbances:none  Narrative Interpretation:   Old EKG Reviewed: unchanged     Labs Reviewed  CBC WITH DIFFERENTIAL  BASIC METABOLIC PANEL  TROPONIN I   Dg Chest 2 View  12/24/2012  *RADIOLOGY REPORT*  Clinical Data: Chest discomfort. Shortness of breath. Nonsmoker.  CHEST - 2 VIEW  Comparison: 03/08/2012.  Findings: Mild central pulmonary vascular prominence. No infiltrate, congestive heart failure or pneumothorax.  Mild tortuosity ascending thoracic aorta.  Heart size within normal limits.  IMPRESSION: Mild tortuosity ascending thoracic aorta.  Mild central pulmonary vascular prominence without evidence of pulmonary edema or segmental infiltrate.   Original Report Authenticated By: Lacy Duverney, M.D.      Diagnosis: 1. Chest pain, noncardiac 2. Cervical radiculopathy 3. Anxiety    MDM  Patient presents to ER with complaints of neck pain and chest pain. Her doctor saw her for this complaint as we can place her on Mobic with improvement. She is not expressing chest pain appears to be very anxious. She has a history of anxiety and panic attack. She also has a history of GERD. Patient has previously been worked up for chest pain and stress test was normal. This was  within the last 6 months. Symptoms are atypical, unlikely to be cardiac. She seems to be very anxious about her family members recent death. She was reassured. Workup was unremarkable.        Gilda Crease, MD 12/24/12 2352

## 2012-12-29 ENCOUNTER — Encounter: Payer: Self-pay | Admitting: *Deleted

## 2012-12-29 ENCOUNTER — Encounter: Payer: Self-pay | Admitting: Family Medicine

## 2012-12-29 ENCOUNTER — Ambulatory Visit (INDEPENDENT_AMBULATORY_CARE_PROVIDER_SITE_OTHER): Payer: 59 | Admitting: Family Medicine

## 2012-12-29 VITALS — BP 156/94 | HR 71 | Ht 66.0 in | Wt 278.0 lb

## 2012-12-29 DIAGNOSIS — R739 Hyperglycemia, unspecified: Secondary | ICD-10-CM

## 2012-12-29 DIAGNOSIS — I10 Essential (primary) hypertension: Secondary | ICD-10-CM

## 2012-12-29 DIAGNOSIS — F419 Anxiety disorder, unspecified: Secondary | ICD-10-CM

## 2012-12-29 DIAGNOSIS — E119 Type 2 diabetes mellitus without complications: Secondary | ICD-10-CM

## 2012-12-29 DIAGNOSIS — R7309 Other abnormal glucose: Secondary | ICD-10-CM

## 2012-12-29 DIAGNOSIS — F411 Generalized anxiety disorder: Secondary | ICD-10-CM

## 2012-12-29 MED ORDER — DULOXETINE HCL 30 MG PO CPEP: 30.0000 mg | ORAL_CAPSULE | Freq: Every day | ORAL | Status: DC

## 2012-12-29 MED ORDER — ALPRAZOLAM 0.5 MG PO TABS: 0.5000 mg | ORAL_TABLET | Freq: Every evening | ORAL | Status: DC | PRN

## 2012-12-29 NOTE — Assessment & Plan Note (Signed)
A: moderate anxiety and bereavement. Patient resistant to medication because she states that she does not feel like this all the time. HOwever she has had at least 3 episodes similar to this in the past 3 years.  P:  Cymbalta: start tonight. Take at least 4 hrs before sleep. Take with xanax for the first week. I recommend taking them both around 7 PM.  Xanax for anxiety may start tonight.  Get back into walking to help reduce stress.

## 2012-12-29 NOTE — Progress Notes (Signed)
Subjective:     Patient ID: Carolyn Holland, female   DOB: [birth year], 42 y.o.   MRN: [id number]  HPI 42 yo F presents for f/u visit to discuss the following:  # anxiety: associated with bilateral neck tension, headaches. Symptoms improved with mobic. On 2/2 she developed chest pain and SOB. Symptoms occurred at rest. She went to the ED. She has history of intermittent anxiety. Trigger this time is sudden death of 47 yo cousin for PE. They were close and she was not able to attend her funeral. Everything with work and her children is fine. She is worried about her own health and fears that she too will die suddenly. She does have a reported history of sleep apnea. She is gaining weight.   # HTN: taking indapamide irregularly. Took it yesterday. Missed 6 doses prior to that.   # health maintenance: due for mammogram and pap smear.   Review of Systems As per HPI GI: stomach pressure Neuro: denies focal weakness, numbness, vision changes.     Objective:   Physical Exam BP 156/94  Pulse 71  Ht 5\' 6"  (1.676 m)  Wt 278 lb (126.1 kg)  BMI 44.87 kg/m2 General appearance: alert, cooperative and no distress Neck: supple. Normal ROM. TTP bilateral paracervical muscles.  Lungs: clear to auscultation bilaterally Heart: regular rate and rhythm, S1, S2 normal, no murmur, click, rub or gallop Abdomen: soft, non-tender; bowel sounds normal; no masses,  no organomegaly Neurologic: Grossly normal GAD-7: 12/ 0-6. 1-1.2,5. 3-3,4 and 7. Somewhat difficult. Moderate anxiety PHQ-9: 4. 0-2,6,7,8,9. 1-4 and 5. 2-3. Minimal depression.     Assessment and Plan:

## 2012-12-29 NOTE — Patient Instructions (Addendum)
Carolyn Holland,  Thank you for coming in today.  You are having anxiety which I believe is contributing to the majority of your symptoms including headache, neck pain, chest pain, shortness of breath.   Please do the following:  1. Take your BP medicine daily.  2. New meds for anxiety  Cymbalta: start tonight. Take at least 4 hrs before sleep. Take with xanax for the first week. I recommend taking them both around 7 PM.  Xanax for anxiety may start tonight.  Get back into walking to help reduce stress.   3. Health maintenance: Screening: mammogram and pap smear.  Regular exercise.   See me in 2 weeks.  Dr. Armen Pickup

## 2012-12-29 NOTE — Assessment & Plan Note (Signed)
A:declined. Elevated BP due to med noncompliance P: take lozol daily. BP well controlled on lozol when patient is compliant.

## 2012-12-29 NOTE — Assessment & Plan Note (Signed)
A: new dx based on A1c.  P: to start metformin at f/u.

## 2013-01-11 ENCOUNTER — Encounter: Payer: Self-pay | Admitting: Family Medicine

## 2013-01-11 ENCOUNTER — Ambulatory Visit (INDEPENDENT_AMBULATORY_CARE_PROVIDER_SITE_OTHER): Payer: 59 | Admitting: Family Medicine

## 2013-01-11 VITALS — BP 143/95 | HR 80 | Ht 66.0 in | Wt 271.0 lb

## 2013-01-11 DIAGNOSIS — F411 Generalized anxiety disorder: Secondary | ICD-10-CM

## 2013-01-11 DIAGNOSIS — E119 Type 2 diabetes mellitus without complications: Secondary | ICD-10-CM

## 2013-01-11 DIAGNOSIS — F419 Anxiety disorder, unspecified: Secondary | ICD-10-CM

## 2013-01-11 DIAGNOSIS — I10 Essential (primary) hypertension: Secondary | ICD-10-CM

## 2013-01-11 MED ORDER — INDAPAMIDE 2.5 MG PO TABS: 5.0000 mg | ORAL_TABLET | Freq: Every day | ORAL | Status: DC

## 2013-01-11 MED ORDER — METFORMIN HCL 500 MG PO TABS: 500.0000 mg | ORAL_TABLET | Freq: Every day | ORAL | Status: DC

## 2013-01-11 MED ORDER — CITALOPRAM HYDROBROMIDE 20 MG PO TABS: 20.0000 mg | ORAL_TABLET | Freq: Every day | ORAL | Status: DC

## 2013-01-11 NOTE — Assessment & Plan Note (Signed)
A: new dx. Cr 0.7. Patient highly motivated to improve diet and exercise P: start metformin once daily F/u in one week at nutrition clinic.

## 2013-01-11 NOTE — Assessment & Plan Note (Signed)
A: elevated above goal P: increase lozol to 5 mg daily.  Avoid salty foods Regular exercise.  F/u in 3 weeks Consider adding ACE inhibitor if not at goal

## 2013-01-11 NOTE — Patient Instructions (Addendum)
Carolyn Holland,  Thank you for coming in to see me in f/u today.  1. Anxiety: Please start celexa once daily in the morning Continue xanax as needed at night.   2. HTN Increase lozol to 5 mg daily (two tabs).   3. DM:  Start metformin once daily with food, lunch.  For your diet:  1. Make sure to eat breakfast, lunch and dinner (also may add one snack mid morning or mid afternoon).  2. Carbs: no more than 2 servings (30 gram/2oz) per meal and 1 serving per snack.  3. Exercise such that you sweat some and your heart rate goes up most days of the week.  4. Water, water, water    F/u in one week for nutrition clinic with me and Therisa Doyne.  F/u in 3 weeks with me.  Dr. Armen Pickup

## 2013-01-11 NOTE — Assessment & Plan Note (Signed)
A: improved with xanax. Still with headaches and anxiety usually at work P: Start celexa 20 mg daily

## 2013-01-11 NOTE — Progress Notes (Signed)
Subjective:     Patient ID: Carolyn Holland, female   DOB: 17-Mar-1971, 42 y.o.   MRN: [id number]  HPI This patient presents today to discuss her anxiety rx, bp rx and recent HbA1C.  #. Anxiety The patient has a hx of anxiety and on last office visit was given rx for Cymbalta and Xanax to help with her anxiety. On today's visit the pt notes she never picked up the Cymbalta because it was too expensive but has been using the Xanax at night which has helped her sleep.   #. Hypertension The patient has had hypertension for years and has been taking Indapamide 2.5 mg qd. On last office visit the patient admitted to not taking this bp medication consistently and we discussed her using it daily and coming back in 3 weeks for a follow up. Today the pt's BP is 143/95 down from 156/94 on previous office visit. The patient admits to good compliance. She does not monitor her BP outside of office visits.  #. Diabetes The patient had an HbA1C performed on 12/29/2012 which revealed a value of 6.7. The patient admits to eating a poor diet but expresses an interest in losing weight now and engaging in better eating behaviors. She has never been dx w/ diabetes before or received rx for tx.  #. Headache/blurred vision The pt gets headaches associated with a blurred vision while she is at work a few times each week x months. She states she feels very anxious and stressed at work because she works fast paced and sometimes people around her aggravate her. The headaches are similar to previous headaches and are particularly on the back of the neck and can wrap around to the front of the head. Headaches associated with blurred vision which is short lived without pain. Pt denies N/V, light headedness, dizziness, LOC, head trauma, eye pain. She does not measure her BP at times of headache.  Review of Systems 12 pt ROS negative except for as mentioned in HPI.    Objective:   Physical Exam General: No acute distress, anxious,  talk is fast paced, over exaggerated hand gestures, obese, pleasant. Head: Normocephalic, atraumatic. Eyes: Opthalmologic exam without cotton wool exudates, hemorrhages or abnormalities. No blown pupils. Cardiovascular: RRR, no murmurs, rubs, gallops. Respiratory: Clear vesicular breath sounds bilaterally through lung fields. Neuro: Motor 5/5 throughout, normal sensations, CN 2-12 grossly intact. No blown pupils or hallucinations.  I examined the patient with Student Dr. Salli Real. I have reviewed the note, made necessary revisions and agree with above.  Zeferino Mounts 01/12/12, 5:05 PM        Assessment and Plan:

## 2013-01-17 ENCOUNTER — Telehealth: Payer: Self-pay | Admitting: Family Medicine

## 2013-01-17 NOTE — Telephone Encounter (Signed)
Called patient. Left VM with her daughter. Was she still interested in nutrition visit? 9-11 tomorrow AM. 30 min time slots. If interested call the clinic for appt.

## 2013-01-29 ENCOUNTER — Encounter: Payer: Self-pay | Admitting: Family Medicine

## 2013-01-29 ENCOUNTER — Ambulatory Visit (INDEPENDENT_AMBULATORY_CARE_PROVIDER_SITE_OTHER): Payer: 59 | Admitting: Family Medicine

## 2013-01-29 VITALS — BP 128/82 | HR 86 | Temp 98.4°F | Ht 66.0 in | Wt 272.5 lb

## 2013-01-29 DIAGNOSIS — M25559 Pain in unspecified hip: Secondary | ICD-10-CM

## 2013-01-29 DIAGNOSIS — F411 Generalized anxiety disorder: Secondary | ICD-10-CM

## 2013-01-29 DIAGNOSIS — M25569 Pain in unspecified knee: Secondary | ICD-10-CM

## 2013-01-29 DIAGNOSIS — M25561 Pain in right knee: Secondary | ICD-10-CM

## 2013-01-29 DIAGNOSIS — F419 Anxiety disorder, unspecified: Secondary | ICD-10-CM

## 2013-01-29 DIAGNOSIS — M25551 Pain in right hip: Secondary | ICD-10-CM | POA: Insufficient documentation

## 2013-01-29 DIAGNOSIS — D649 Anemia, unspecified: Secondary | ICD-10-CM

## 2013-01-29 LAB — POCT SEDIMENTATION RATE: POCT SED RATE: 3 mm/hr (ref 0–22)

## 2013-01-29 MED ORDER — IBUPROFEN 600 MG PO TABS: 600.0000 mg | ORAL_TABLET | Freq: Three times a day (TID) | ORAL | Status: DC | PRN

## 2013-01-29 MED ORDER — ALPRAZOLAM 0.5 MG PO TABS: 0.5000 mg | ORAL_TABLET | Freq: Every evening | ORAL | Status: DC | PRN

## 2013-01-29 NOTE — Progress Notes (Signed)
Subjective:     Patient ID: Carolyn Holland, female   DOB: 06-26-1971, 42 y.o.   MRN: [id number]  HPI 42 yo F presents for f/u visit to discuss the following:  # anxiety: improved. Taking xanax q HS. Has not started celexa. Last had symptoms of chest palpitations two nights ago. Still worried about her health. Would like HIV test at next visit.   # knee pain: chronic pain. Hx of L knee scope for meniscal injury. No with anterior  L knee pain and medial R knee pain. Denies new injury or swelling. Denies locking and popping. Pain radiates down anterior shins.   #R hip pain: posterior hip pain along inferior gluteus maximus, non radiating. No injury. Deep ache. No groin pain.   Review of Systems No rash, fever, night sweats, weight loss.     Objective:   Physical Exam BP 128/82  Pulse 86  Temp(Src) 98.4 F (36.9 C) (Oral)  Ht 5\' 6"  (1.676 m)  Wt 272 lb 8 oz (123.605 kg)  BMI 44 kg/m2 General appearance: alert, cooperative and no distress, obese Hip: R ROM IR: 80 Deg, ER: 80 Deg, Flexion: 120 Deg, Extension: 100 Deg, Abduction: 45 Deg, Adduction: 45 Deg Strength IR: 5/5, ER: 5/5, Flexion: 5/5, Extension: 5/5, Abduction: 5/5, Adduction: 5/5 Pelvic alignment unremarkable to inspection and palpation. Standing hip rotation and gait without trendelenburg / unsteadiness. Greater trochanter without tenderness to palpation. No tenderness over piriformis.  No SI joint tenderness and normal minimal SI movement.  Negative FABER and log roll.   Knees: full  ROM. No effusion.  R knee: TTP medial joint line. + valgus stress test.  L knee: no joint line tenderness. No provacative signs.     Assessment and Plan:

## 2013-01-29 NOTE — Assessment & Plan Note (Signed)
A: muscle pain. No joint pathology on exam. No evidence of soft tissue injury/infection P: Schedule NSAID

## 2013-01-29 NOTE — Assessment & Plan Note (Signed)
A: knee pain w/o injury. Suspect OA given obesity.  P: Schedule NSAID per AVS Check ESR and RA to rule autoimmune dz and reassure patient.

## 2013-01-29 NOTE — Assessment & Plan Note (Signed)
Check iron level

## 2013-01-29 NOTE — Assessment & Plan Note (Signed)
A: persistent. Slight improvement. P: Start celexa with xanax for fist 5 days of tx or so

## 2013-01-29 NOTE — Patient Instructions (Addendum)
Shaquoya,  Thank you for coming in to follow up.   Please start celexa Start during the weekend Take with xanax for first 5 days or so.   I will call with lab results.   Please continue ibuprofen for r hip and knee pains: take 1 tab 3 times daily for next 2 weeks. This will reduce inflammation.   F/u in two weeks after starting celexa.   Dr. Armen Pickup

## 2013-01-30 ENCOUNTER — Encounter: Payer: Self-pay | Admitting: Family Medicine

## 2013-01-30 ENCOUNTER — Telehealth: Payer: Self-pay | Admitting: Family Medicine

## 2013-01-30 LAB — RHEUMATOID FACTOR: Rhuematoid fact SerPl-aCnc: <10 IU/mL (ref <=14)

## 2013-01-30 NOTE — Telephone Encounter (Signed)
Called patient at home. Unable to leave VM.  Results letter sent.  The test results show that you iron level is normal. Also you have no evidence of inflammatory arthritis or myositis, including rheumatoid arthritis.  If you have any questions or concerns, please don't hesitate to call.

## 2013-02-12 ENCOUNTER — Ambulatory Visit: Payer: Self-pay | Admitting: Family Medicine

## 2013-02-18 ENCOUNTER — Emergency Department (HOSPITAL_COMMUNITY): Payer: 59

## 2013-02-18 ENCOUNTER — Encounter (HOSPITAL_COMMUNITY): Payer: Self-pay | Admitting: Emergency Medicine

## 2013-02-18 ENCOUNTER — Other Ambulatory Visit: Payer: Self-pay

## 2013-02-18 ENCOUNTER — Emergency Department (HOSPITAL_COMMUNITY)
Admission: EM | Admit: 2013-02-18 | Discharge: 2013-02-18 | Disposition: A | Discharge status: Home / Self Care | Payer: 59 | Attending: Emergency Medicine | Admitting: Emergency Medicine

## 2013-02-18 DIAGNOSIS — M25561 Pain in right knee: Secondary | ICD-10-CM

## 2013-02-18 DIAGNOSIS — R079 Chest pain, unspecified: Secondary | ICD-10-CM | POA: Insufficient documentation

## 2013-02-18 DIAGNOSIS — Z8719 Personal history of other diseases of the digestive system: Secondary | ICD-10-CM | POA: Insufficient documentation

## 2013-02-18 DIAGNOSIS — M25569 Pain in unspecified knee: Secondary | ICD-10-CM | POA: Insufficient documentation

## 2013-02-18 DIAGNOSIS — Z8659 Personal history of other mental and behavioral disorders: Secondary | ICD-10-CM | POA: Insufficient documentation

## 2013-02-18 DIAGNOSIS — I10 Essential (primary) hypertension: Secondary | ICD-10-CM | POA: Insufficient documentation

## 2013-02-18 DIAGNOSIS — Z90712 Acquired absence of cervix with remaining uterus: Secondary | ICD-10-CM | POA: Insufficient documentation

## 2013-02-18 DIAGNOSIS — Z79899 Other long term (current) drug therapy: Secondary | ICD-10-CM | POA: Insufficient documentation

## 2013-02-18 NOTE — ED Provider Notes (Signed)
History     CSN: [id number]  Arrival date & time 02/18/13  0921   First MD Initiated Contact with Patient 02/18/13 [phone]      Chief Complaint  Patient presents with  . Leg Pain    bilateral leg pain x 3 weeks  . Chest Pain    reports "pinching"sensation over l/breast last night    (Consider location/radiation/quality/duration/timing/severity/associated sxs/prior treatment) Patient is a 42 y.o. female presenting with leg pain and chest pain. The history is provided by the patient.  Leg Pain Associated symptoms: no back pain   Chest Pain Associated symptoms: no abdominal pain, no back pain, no headache, no nausea, no numbness, no shortness of breath, not vomiting and no weakness    patient presents with acute on chronic pain. In her knees hands in her bilateral lower legs. She also states that it goes up her right posterior leg. His been seen by this Clinical research associate primary care doctor's. She's had previous knee problems. No difficulty breathing. She states if she takes Tylenol arthritis is good for 2 days, but states she does not want to take medicines for it. She states that the blood work and not been able to find a cause. She will occasionally have swelling in her legs she doesn't take her blood pressure medications. She's previously had a knee scope done on her left knee. She was supposed to be started on Celexa, but has not started it. She states she does have occasional sharp left-sided chest pain. She states she's had a cough. No fevers. She states she had some sputum previously but it has cleared up. She states she is prediabetic but was started on metformin.  Past Medical History  Diagnosis Date  . GERD (gastroesophageal reflux disease) 2000    come and go  . Sleep apnea 2012  . Anxiety 2012  . Panic attack 2012    situational related to move from [place].   [name] Hypertension 2008  . Bilateral anterior knee pain 2012    Past Surgical History  Procedure Laterality Date  . Partial  hysterectomy  2009  . Laproscopic knee surgery  2011    L knee for bilateral meniscal tear. Following acute injury.   . Abdominal hysterectomy    . Cesarean section  2002    Family History  Problem Relation Age of Onset  . Diabetes Mother   . Hypertension Mother   . Hypertension Father   . Heart disease Mother 43  . Stroke Mother 40    x 2  . Alcohol abuse Mother     previous   . Drug abuse Mother     previous cocaine     History  Substance Use Topics  . Smoking status: Never Smoker   . Smokeless tobacco: Never Used  . Alcohol Use: Yes     Comment: occassional, social     OB History   Grav Para Term Preterm Abortions TAB SAB Ect Mult Living   4 3 3  1 1    3       Review of Systems  Constitutional: Negative for activity change and appetite change.  HENT: Negative for neck stiffness.   Eyes: Negative for pain.  Respiratory: Negative for chest tightness and shortness of breath.   Cardiovascular: Positive for chest pain. Negative for leg swelling.  Gastrointestinal: Negative for nausea, vomiting, abdominal pain and diarrhea.  Genitourinary: Negative for flank pain.  Musculoskeletal: Positive for gait problem. Negative for myalgias and back pain.  Skin: Negative  for rash.  Neurological: Negative for weakness, numbness and headaches.  Psychiatric/Behavioral: Negative for behavioral problems. The patient is nervous/anxious.     Allergies  Penicillins and Penicillins  Home Medications   Current Outpatient Rx  Name  Route  Sig  Dispense  Refill  . acetaminophen (TYLENOL) 500 MG tablet   Oral   Take 1,000 mg by mouth every 4 (four) hours as needed. For shoulder pain         . ALPRAZolam (XANAX) 0.5 MG tablet   Oral   Take 1 tablet (0.5 mg total) by mouth at bedtime as needed for sleep or anxiety.   20 tablet   0   . citalopram (CELEXA) 20 MG tablet   Oral   Take 1 tablet (20 mg total) by mouth daily.   30 tablet   3   . indapamide (LOZOL) 2.5 MG  tablet   Oral   Take 2.5 mg by mouth 2 (two) times daily.         . metFORMIN (GLUCOPHAGE) 500 MG tablet   Oral   Take 1 tablet (500 mg total) by mouth daily before lunch.   90 tablet   3     BP 129/78  Pulse 78  Temp(Src) 97.8 F (36.6 C) (Oral)  Resp 16  Wt 271 lb (122.925 kg)  BMI 43.76 kg/m2  SpO2 99%  Physical Exam  Nursing note and vitals reviewed. Constitutional: She is oriented to person, place, and time. She appears well-developed and well-nourished.  Patient is obese  HENT:  Head: Normocephalic and atraumatic.  Eyes: EOM are normal. Pupils are equal, round, and reactive to light.  Neck: Normal range of motion. Neck supple.  Cardiovascular: Normal rate, regular rhythm and normal heart sounds.   No murmur heard. Pulmonary/Chest: Effort normal and breath sounds normal. No respiratory distress. She has no wheezes. She has no rales.  Abdominal: Soft. Bowel sounds are normal. She exhibits no distension. There is no tenderness. There is no rebound and no guarding.  Musculoskeletal: Normal range of motion.  Mild tenderness to bilateral lower legs. Mild pain with movement of bilateral knees. Sensation is intact distally. No erythema. No edema. No tenderness at hip.  Neurological: She is alert and oriented to person, place, and time. No cranial nerve deficit.  Skin: Skin is warm and dry.  Psychiatric: She has a normal mood and affect. Her speech is normal.    ED Course  Procedures (including critical care time)  Labs Reviewed - No data to display Dg Chest 2 View  02/18/2013  *RADIOLOGY REPORT*  Clinical Data: Chest pain.  CHEST - 2 VIEW  Comparison: 12/24/2012  Findings: Two views of the chest demonstrate clear lungs. Heart and mediastinum are within normal limits.  The trachea is midline.  IMPRESSION: No acute cardiopulmonary disease.   Original Report Authenticated By: Richarda Overlie, M.D.    Dg Knee Complete 4 Views Left  02/18/2013  *RADIOLOGY REPORT*  Clinical Data:  Bilateral knee pain  LEFT KNEE - COMPLETE 4+ VIEW  Comparison: 02/18/2013  Findings: Tricompartmental osteoarthritis noted, most pronounced in the medial compartment.  There is moderate joint space loss, sclerosis and osteophyte formation medially. Normal alignment without fracture or effusion.  No definite soft tissue abnormality.  IMPRESSION: Tricompartmental osteoarthritis most pronounced medially.  No acute osseous abnormality or effusion   Original Report Authenticated By: Judie Petit. Shick, M.D.    Dg Knee Complete 4 Views Right  02/18/2013  *RADIOLOGY REPORT*  Clinical Data: Knee pain  and history of meniscal injury.  RIGHT KNEE - COMPLETE 4+ VIEW  Comparison: None.  Findings: Four views of the right knee were obtained.  There is mild lateral subluxation of the knee.  Diffuse degenerative changes, particularly in the patellofemoral compartment.  There is joint space narrowing in the medial compartment.  Difficult to exclude a small suprapatellar joint effusion.  IMPRESSION: Degenerative changes in the right knee without acute bony abnormality.   Original Report Authenticated By: Richarda Overlie, M.D.      1. Knee pain, bilateral      Date: 02/18/2013  Rate: 82  Rhythm: normal sinus rhythm  QRS Axis: normal  Intervals: normal  ST/T Wave abnormalities: normal  Conduction Disutrbances: none  Narrative Interpretation: unremarkable     MDM  Patient presents with acute on chronic knee pain and paresthesias/tingling in her lower legs. She's been extensively worked up for this by her primary care Dr. She has had a negative d-dimer, negative sedimentation rate, negative rheumatoid factor. X-ray was done since this was a previous plan. It shows arthritis and degenerative changes. Patient was supposed to be started on Celexa but has not taken it yet. She'll be started on it. This could be knee problems causing the pain. Also could be neuropathy. Will encourage the Celexa use. Doubt DVT. EKG is normal. Will  discharge to follow with her primary care Dr.        Juliet Rude. Rubin Payor, MD 02/18/13 1215

## 2013-02-18 NOTE — ED Notes (Addendum)
Pt reports "pinching " sensation over in l/chest,  l/breast last night C/o aching in both legs , radiating up from calf to thigh x 3 weeks. Pain relieved by Tylenol. Pt takes tylenol once a day for pain. Pt is concerned about possible clots in legs Seen three weeks ago, by PCP,  for same concern. Follow up appt. Was missed last week.

## 2013-03-01 ENCOUNTER — Encounter (HOSPITAL_COMMUNITY): Payer: Self-pay | Admitting: Emergency Medicine

## 2013-03-01 ENCOUNTER — Emergency Department (HOSPITAL_COMMUNITY)
Admission: EM | Admit: 2013-03-01 | Discharge: 2013-03-01 | Disposition: A | Discharge status: Home / Self Care | Payer: 59 | Attending: Emergency Medicine | Admitting: Emergency Medicine

## 2013-03-01 ENCOUNTER — Emergency Department (HOSPITAL_COMMUNITY): Payer: 59

## 2013-03-01 DIAGNOSIS — IMO0001 Reserved for inherently not codable concepts without codable children: Secondary | ICD-10-CM | POA: Insufficient documentation

## 2013-03-01 DIAGNOSIS — I10 Essential (primary) hypertension: Secondary | ICD-10-CM | POA: Insufficient documentation

## 2013-03-01 DIAGNOSIS — R002 Palpitations: Secondary | ICD-10-CM | POA: Insufficient documentation

## 2013-03-01 DIAGNOSIS — F410 Panic disorder [episodic paroxysmal anxiety] without agoraphobia: Secondary | ICD-10-CM | POA: Insufficient documentation

## 2013-03-01 DIAGNOSIS — G4730 Sleep apnea, unspecified: Secondary | ICD-10-CM | POA: Insufficient documentation

## 2013-03-01 DIAGNOSIS — Z79899 Other long term (current) drug therapy: Secondary | ICD-10-CM | POA: Insufficient documentation

## 2013-03-01 DIAGNOSIS — Z8719 Personal history of other diseases of the digestive system: Secondary | ICD-10-CM | POA: Insufficient documentation

## 2013-03-01 DIAGNOSIS — R42 Dizziness and giddiness: Secondary | ICD-10-CM | POA: Insufficient documentation

## 2013-03-01 DIAGNOSIS — M79609 Pain in unspecified limb: Secondary | ICD-10-CM | POA: Insufficient documentation

## 2013-03-01 DIAGNOSIS — R209 Unspecified disturbances of skin sensation: Secondary | ICD-10-CM | POA: Insufficient documentation

## 2013-03-01 DIAGNOSIS — F411 Generalized anxiety disorder: Secondary | ICD-10-CM | POA: Insufficient documentation

## 2013-03-01 DIAGNOSIS — R079 Chest pain, unspecified: Secondary | ICD-10-CM | POA: Insufficient documentation

## 2013-03-01 LAB — BASIC METABOLIC PANEL
BUN: 16 mg/dL (ref 6–23)
Chloride: 99 mEq/L (ref 96–112)
GFR calc Af Amer: >90 mL/min (ref >90)
Glucose, Bld: 158 mg/dL — ABNORMAL HIGH (ref 70–99)
Potassium: 3.7 mEq/L (ref 3.5–5.1)
Sodium: 136 mEq/L (ref 135–145)

## 2013-03-01 LAB — CBC
HCT: 40.7 % (ref 36.0–46.0)
Hemoglobin: 14.5 g/dL (ref 12.0–15.0)
RDW: 12.6 % (ref 11.5–15.5)
WBC: 5.8 10*3/uL (ref 4.0–10.5)

## 2013-03-01 LAB — POCT I-STAT TROPONIN I

## 2013-03-01 MED ORDER — TRAMADOL HCL 50 MG PO TABS: 50.0000 mg | ORAL_TABLET | Freq: Four times a day (QID) | ORAL | Status: DC | PRN

## 2013-03-01 MED ORDER — LORAZEPAM 1 MG PO TABS
1.0000 mg | ORAL_TABLET | Freq: Once | ORAL | Status: AC
  Administered 2013-03-01: 1 mg via ORAL
  Filled 2013-03-01: qty 1

## 2013-03-01 NOTE — ED Provider Notes (Signed)
History     CSN: [id number]  Arrival date & time 03/01/13  1103   First MD Initiated Contact with Patient 03/01/13 1343      Chief Complaint  Patient presents with  . Chest Pain  . Numbness  . Dizziness    (Consider location/radiation/quality/duration/timing/severity/associated sxs/prior treatment) HPI Comments: Patient is a 42 year old female with a past medical history of GERD, hypertension and anxiety who presents with chest pain that started yesterday. Patient reports having sudden onset of sharp, central chest pain that is severe without radiation. Patient reports the pain starts without known trigger and last a few seconds and spontaneously resolves without intervention. Patient reports occasional associated dizziness, left arm pain and palpitations. No aggravating/alleviating factors. Patient reports having these symptoms before which were related to anxiety. Patient does not smoke and has no family history of early heart disease. She denies any additional associated symptoms. She denies recent travel, exogenous estrogen, recent surgery/trauma, or previous history of DVT.   Patient is a 42 y.o. female presenting with chest pain.  Chest Pain Associated symptoms: dizziness     Past Medical History  Diagnosis Date  . GERD (gastroesophageal reflux disease) 2000    come and go  . Sleep apnea 2012  . Anxiety 2012  . Panic attack 2012    situational related to move from [place].   [name] Hypertension 2008  . Bilateral anterior knee pain 2012    Past Surgical History  Procedure Laterality Date  . Partial hysterectomy  2009  . Laproscopic knee surgery  2011    L knee for bilateral meniscal tear. Following acute injury.   . Abdominal hysterectomy    . Cesarean section  2002    Family History  Problem Relation Age of Onset  . Diabetes Mother   . Hypertension Mother   . Hypertension Father   . Heart disease Mother 6  . Stroke Mother 40    x 2  . Alcohol abuse Mother     previous   . Drug abuse Mother     previous cocaine     History  Substance Use Topics  . Smoking status: Never Smoker   . Smokeless tobacco: Never Used  . Alcohol Use: Yes     Comment: occassional, social     OB History   Grav Para Term Preterm Abortions TAB SAB Ect Mult Living   4 3 3  1 1    3       Review of Systems  Cardiovascular: Positive for chest pain.  Musculoskeletal: Positive for myalgias.  Neurological: Positive for dizziness.  All other systems reviewed and are negative.    Allergies  Penicillins  Home Medications   Current Outpatient Rx  Name  Route  Sig  Dispense  Refill  . acetaminophen (TYLENOL) 650 MG CR tablet   Oral   Take 650-1,300 mg by mouth every 8 (eight) hours as needed for pain.         [name] ALPRAZolam (XANAX) 0.5 MG tablet   Oral   Take 0.5 mg by mouth at bedtime as needed for anxiety.         . indapamide (LOZOL) 2.5 MG tablet   Oral   Take 2.5 mg by mouth 2 (two) times daily.         . metFORMIN (GLUCOPHAGE) 500 MG tablet   Oral   Take 1 tablet (500 mg total) by mouth daily before lunch.   90 tablet   3  BP 103/74  Pulse 79  Temp(Src) 98.4 F (36.9 C) (Oral)  Resp 24  SpO2 100%  Physical Exam  Nursing note and vitals reviewed. Constitutional: She is oriented to person, place, and time. She appears well-developed and well-nourished. No distress.  HENT:  Head: Normocephalic and atraumatic.  Eyes: Conjunctivae are normal.  Neck: Normal range of motion.  Cardiovascular: Normal rate and regular rhythm.  Exam reveals no gallop and no friction rub.   No murmur heard. Pulmonary/Chest: Effort normal and breath sounds normal. She has no wheezes. She has no rales. She exhibits no tenderness.  Abdominal: Soft. She exhibits no distension. There is no tenderness. There is no rebound and no guarding.  Musculoskeletal: Normal range of motion.  No leg tenderness to palpation or swelling noted.   Neurological: She is alert  and oriented to person, place, and time. Coordination normal.  Speech is goal-oriented. Moves limbs without ataxia.   Skin: Skin is warm and dry.  Psychiatric: She has a normal mood and affect. Her behavior is normal.    ED Course  Procedures (including critical care time)  Labs Reviewed  BASIC METABOLIC PANEL - Abnormal; Notable for the following:    Glucose, Bld 158 (*)    GFR calc non Af Amer 86 (*)    All other components within normal limits  CBC  POCT I-STAT TROPONIN I   Dg Chest 2 View  03/01/2013  *RADIOLOGY REPORT*  Clinical Data: Left chest pain and dizziness.  CHEST - 2 VIEW  Comparison: 02/18/2013  Findings: Patient minimally rotated left. Midline trachea.  Normal heart size and mediastinal contours. No pleural effusion or pneumothorax.  Clear lungs.  IMPRESSION: Normal chest.   Original Report Authenticated By: Jeronimo Greaves, M.D.      1. Chest pain       MDM  2:55 PM Patient's troponin, chest xray and labs unremarkable. Patient is PERC negative. Patient's symptoms likely related to her anxiety and stress level. I will order ativan for the patient. Patient will have Tramadol for leg pain as well. Patient advised to follow up with Orthopedics regarding her leg pain and her PCP regarding her anxiety and chest pain. Vitals stable and patient afebrile. Patient instructed to return with worsening or concerning symptoms.        Emilia Beck, PA-C 03/01/13 1512

## 2013-03-01 NOTE — ED Notes (Addendum)
Pt c/o midsternal CP into throat with dizziness and left arm and leg pain and numbness; pt sts started yesterday; pt tearful

## 2013-03-02 ENCOUNTER — Ambulatory Visit (INDEPENDENT_AMBULATORY_CARE_PROVIDER_SITE_OTHER): Payer: 59 | Admitting: Family Medicine

## 2013-03-02 ENCOUNTER — Encounter: Payer: Self-pay | Admitting: Family Medicine

## 2013-03-02 VITALS — BP 126/80 | HR 72 | Ht 66.0 in | Wt 269.3 lb

## 2013-03-02 DIAGNOSIS — F419 Anxiety disorder, unspecified: Secondary | ICD-10-CM

## 2013-03-02 DIAGNOSIS — M25569 Pain in unspecified knee: Secondary | ICD-10-CM

## 2013-03-02 DIAGNOSIS — F411 Generalized anxiety disorder: Secondary | ICD-10-CM

## 2013-03-02 DIAGNOSIS — M25561 Pain in right knee: Secondary | ICD-10-CM

## 2013-03-02 DIAGNOSIS — G4733 Obstructive sleep apnea (adult) (pediatric): Secondary | ICD-10-CM

## 2013-03-02 NOTE — Assessment & Plan Note (Signed)
A: untreated.  P: new sleep study eval ordered.

## 2013-03-02 NOTE — Patient Instructions (Addendum)
Thank you for coming in today.  Start celexa in the morning. Continue at night.  Metformin in the afternoon.   Take it easy, ice knee tonight. Continue tylenol up to three times daily. Please see me for pain, redness, swelling in the knee joint.   Dr. Armen Pickup

## 2013-03-02 NOTE — Assessment & Plan Note (Signed)
A: declined. Patient with multiple somatic complaints. Tearful in office. Not taking celexa although she has it at home.  P: take celexa, starting tomorrow AM.

## 2013-03-02 NOTE — Assessment & Plan Note (Signed)
A: 2/2 to DJD. P: Continue tylenol L knee corticosteroid injection today, will f/u response to treatment.

## 2013-03-02 NOTE — Progress Notes (Signed)
Subjective:     Patient ID: Carolyn Holland, female   DOB: Apr 08, 1971, 42 y.o.   MRN: [id number]  HPI 42 year old female presents for follow visit to discuss the following:  1. bilateral knee pain: Chronic bilateral knee pain left greater than right. Associated with pain at night it radiates down her legs. Also has posterior left knee pain. She denies an injury. Does have a history of left medial meniscus tear status post repair.  2. Anxiety:  patient with worsening anxiety. She went to the ED for episode of chest pain and feeling of something stuck in her throat. Workup was normal. 2 days ago she reports dizziness and confusion while sitting at her desk. The symptoms lasted for about 15 minutes. The symptoms resolved when she drank a coke and cookies. She denies associated jitteriness, nausea and sweating. She is not start Celexa. She is taking Xanax occasionally at night. It is concerned because he has daytime fatigue.  3. daytime fatigue: Patient with a known history of sleep apnea for which he uses CPAP machine for the past. She's not currently using a CPAP machine. However she reports her fatigue has been most prominent over the last 2 days.   Review of Systems As per HPI     Objective:   Physical Exam BP 126/80  Pulse 72  Ht 5\' 6"  (1.676 m)  Wt 269 lb 4.8 oz (122.154 kg)  BMI 43.49 kg/m2 General appearance: alert and cooperative, tearful during exam. Knees: mild swelling on L No erythema b/l. Decreased ROM on L 5-100 degrees, medial joint line tenderness. Negative Lachman.  Back/HIP: negative straight leg raising b/l, negative FABER. Tight hamstrings L>R.   Knee x-rays reviewed: marked L knee medical compartment arthritis.   After obtaining informed consent and doing a sterile prep a steroid injection was performed at L knee using anterior lateral approach  Using 3 cc 1% plain Lidocaine and 40 mg of Depo Medrol. This was well tolerated.      Assessment and Plan:

## 2013-03-03 NOTE — ED Provider Notes (Signed)
Medical screening examination/treatment/procedure(s) were performed by non-physician practitioner and as supervising physician I was immediately available for consultation/collaboration.   Virgin Zellers M Syvilla Martin, DO 03/03/13 1214 

## 2013-04-24 ENCOUNTER — Encounter: Payer: Self-pay | Admitting: Family Medicine

## 2013-04-24 ENCOUNTER — Ambulatory Visit (INDEPENDENT_AMBULATORY_CARE_PROVIDER_SITE_OTHER): Payer: 59 | Admitting: Family Medicine

## 2013-04-24 VITALS — BP 111/79 | HR 98 | Temp 98.1°F | Ht 66.0 in | Wt 259.0 lb

## 2013-04-24 DIAGNOSIS — F419 Anxiety disorder, unspecified: Secondary | ICD-10-CM

## 2013-04-24 DIAGNOSIS — M25561 Pain in right knee: Secondary | ICD-10-CM

## 2013-04-24 DIAGNOSIS — M25562 Pain in left knee: Secondary | ICD-10-CM

## 2013-04-24 DIAGNOSIS — M25569 Pain in unspecified knee: Secondary | ICD-10-CM

## 2013-04-24 DIAGNOSIS — F411 Generalized anxiety disorder: Secondary | ICD-10-CM

## 2013-04-24 MED ORDER — CITALOPRAM HYDROBROMIDE 20 MG PO TABS: 20.0000 mg | ORAL_TABLET | Freq: Every day | ORAL | Status: DC

## 2013-04-24 MED ORDER — ALPRAZOLAM 0.5 MG PO TABS: 0.5000 mg | ORAL_TABLET | Freq: Every evening | ORAL | Status: DC | PRN

## 2013-04-24 MED ORDER — METHYLPREDNISOLONE ACETATE 40 MG/ML IJ SUSP
40.0000 mg | Freq: Once | INTRAMUSCULAR | Status: AC
  Administered 2013-04-24: 40 mg via INTRA_ARTICULAR

## 2013-04-24 NOTE — Assessment & Plan Note (Signed)
A: 2/2 to DJD, Left greater than right.  P: Continue tylenol L knee corticosteroid injection today, will f/u response to treatment.

## 2013-04-24 NOTE — Assessment & Plan Note (Signed)
A: improved. P: Patient agrees to start Celexa she will take Xanax while initiating Celexa.

## 2013-04-24 NOTE — Progress Notes (Signed)
Subjective:     Patient ID: Carolyn Holland, female   DOB: 1971-06-21, 42 y.o.   MRN: [id number]  HPI 42 year old female presents for follow visit to discuss the following:  1. recurrent bilateral knee pain: Patient reports that pain was significantly improved by a corticosteroid shot done 8 weeks ago. The pain and return in about one week ago. The pain is bilateral knees left greater than right in the medial joint line. The pain does occasionally radiate inferiorly. She denies recent injury or fall. She does work 2 jobs and is on her feet for the majority of the day. She has lost 10 pounds. She denies joint swelling, redness, fever or chills.  2. Anxiety: Patient reports her mood is good. She has still not started Celexa. She has finished the Xanax provided to her to take with Celexa. She states that she is taking the Xanax as needed at night for insomnia. Although she is feeling well today she does agree that she does not want her anxiety level to return to work was previously. She is willing to start Celexa.  Review of Systems  as per history of present illness    Objective:   Physical Exam BP 111/79  Pulse 98  Temp(Src) 98.1 F (36.7 C) (Oral)  Ht 5\' 6"  (1.676 m)  Wt 259 lb (117.482 kg)  BMI 41.82 kg/m2 General appearance: alert, cooperative and no distress Extremities: extremities normal, atraumatic, no cyanosis or edema  B/l knees: Full ROM, full LE strength. No effusion. Mild medial joint line tenderness L>R.   After obtaining informed consent a steroid injection was performed at left knee using an anterior medial approach using 3 cc of 1% plain Lidocaine and 40 mg of Depo-Medrol. This was well tolerated.     Assessment and Plan:

## 2013-04-24 NOTE — Patient Instructions (Addendum)
Thank you for coming in today.  You received a corticosteroid injection of 3 ML's 1% lidocaine and 40 mg of Depo-Medrol and your left knee today. Take it easy, ice knee tonight. Continue tylenol up to three times daily. Please see me for pain, redness, swelling in the knee joint.   Dr. Armen Pickup

## 2013-06-13 ENCOUNTER — Encounter (HOSPITAL_COMMUNITY): Payer: Self-pay | Admitting: Emergency Medicine

## 2013-06-13 ENCOUNTER — Emergency Department (HOSPITAL_COMMUNITY): Payer: 59

## 2013-06-13 ENCOUNTER — Emergency Department (HOSPITAL_COMMUNITY)
Admission: EM | Admit: 2013-06-13 | Discharge: 2013-06-13 | Disposition: A | Discharge status: Home / Self Care | Payer: 59 | Attending: Emergency Medicine | Admitting: Emergency Medicine

## 2013-06-13 DIAGNOSIS — Z880 Allergy status to penicillin: Secondary | ICD-10-CM | POA: Insufficient documentation

## 2013-06-13 DIAGNOSIS — G4730 Sleep apnea, unspecified: Secondary | ICD-10-CM | POA: Insufficient documentation

## 2013-06-13 DIAGNOSIS — F411 Generalized anxiety disorder: Secondary | ICD-10-CM | POA: Insufficient documentation

## 2013-06-13 DIAGNOSIS — Z79899 Other long term (current) drug therapy: Secondary | ICD-10-CM | POA: Insufficient documentation

## 2013-06-13 DIAGNOSIS — K219 Gastro-esophageal reflux disease without esophagitis: Secondary | ICD-10-CM | POA: Insufficient documentation

## 2013-06-13 DIAGNOSIS — E119 Type 2 diabetes mellitus without complications: Secondary | ICD-10-CM | POA: Insufficient documentation

## 2013-06-13 DIAGNOSIS — R079 Chest pain, unspecified: Secondary | ICD-10-CM

## 2013-06-13 DIAGNOSIS — F410 Panic disorder [episodic paroxysmal anxiety] without agoraphobia: Secondary | ICD-10-CM | POA: Insufficient documentation

## 2013-06-13 DIAGNOSIS — R0602 Shortness of breath: Secondary | ICD-10-CM | POA: Insufficient documentation

## 2013-06-13 DIAGNOSIS — I10 Essential (primary) hypertension: Secondary | ICD-10-CM | POA: Insufficient documentation

## 2013-06-13 DIAGNOSIS — Z7982 Long term (current) use of aspirin: Secondary | ICD-10-CM | POA: Insufficient documentation

## 2013-06-13 DIAGNOSIS — R0789 Other chest pain: Secondary | ICD-10-CM | POA: Insufficient documentation

## 2013-06-13 LAB — PRO B NATRIURETIC PEPTIDE: Pro B Natriuretic peptide (BNP): 10.2 pg/mL (ref 0–125)

## 2013-06-13 LAB — BASIC METABOLIC PANEL
BUN: 19 mg/dL (ref 6–23)
CO2: 29 mEq/L (ref 19–32)
Chloride: 100 mEq/L (ref 96–112)
Creatinine, Ser: 0.93 mg/dL (ref 0.50–1.10)
Glucose, Bld: 114 mg/dL — ABNORMAL HIGH (ref 70–99)

## 2013-06-13 LAB — CBC
HCT: 39.5 % (ref 36.0–46.0)
Hemoglobin: 13.4 g/dL (ref 12.0–15.0)
MCV: 83.7 fL (ref 78.0–100.0)
Platelets: 221 10*3/uL (ref 150–400)
RBC: 4.72 MIL/uL (ref 3.87–5.11)
WBC: 6.2 10*3/uL (ref 4.0–10.5)

## 2013-06-13 NOTE — ED Notes (Signed)
D/c home- alert and ambulatory with steady gait

## 2013-06-13 NOTE — ED Provider Notes (Addendum)
History    CSN: [id number] Arrival date & time 06/13/13  1707  First MD Initiated Contact with Patient 06/13/13 1717     Chief Complaint  Patient presents with  . Chest Pain  . left arm pain   . Shortness of Breath   (Consider location/radiation/quality/duration/timing/severity/associated sxs/prior Treatment) HPI Comments: Patient presents to the emergency department for evaluation of chest pain. Symptoms began 3 days ago. Patient reports a sharp stabbing pain in the left upper chest that comes and goes. Pain shoots down her arm at times. She did have some pain on the right side of her chest intermittently as well. Symptoms come on randomly, is not identified alleviating or exacerbating factors. She has had shortness of breath at times, no shortness of breath currently. Patient reports that this does not feel like her panic attacks. She has taken antacid for the symptoms and it did not help. The last couple of days she has been taking baby aspirin and it seems to make the pain better.  Patient is a 42 y.o. female presenting with chest pain and shortness of breath.  Chest Pain Associated symptoms: shortness of breath   Shortness of Breath Associated symptoms: chest pain    Past Medical History  Diagnosis Date  . GERD (gastroesophageal reflux disease) 2000    come and go  . Sleep apnea 2012  . Anxiety 2012  . Panic attack 2012    situational related to move from [place].   [name] Hypertension 2008  . Bilateral anterior knee pain 2012   Past Surgical History  Procedure Laterality Date  . Partial hysterectomy  2009  . Laproscopic knee surgery  2011    L knee for bilateral meniscal tear. Following acute injury.   . Abdominal hysterectomy    . Cesarean section  2002   Family History  Problem Relation Age of Onset  . Diabetes Mother   . Hypertension Mother   . Hypertension Father   . Heart disease Mother 15  . Stroke Mother 40    x 2  . Alcohol abuse Mother     previous    . Drug abuse Mother     previous cocaine    History  Substance Use Topics  . Smoking status: Never Smoker   . Smokeless tobacco: Never Used  . Alcohol Use: Yes     Comment: occassional, social    OB History   Grav Para Term Preterm Abortions TAB SAB Ect Mult Living   4 3 3  1 1    3      Review of Systems  Respiratory: Positive for shortness of breath.   Cardiovascular: Positive for chest pain.  All other systems reviewed and are negative.    Allergies  Penicillins  Home Medications   Current Outpatient Rx  Name  Route  Sig  Dispense  Refill  . acetaminophen (TYLENOL) 650 MG CR tablet   Oral   Take 650-1,300 mg by mouth every 8 (eight) hours as needed for pain.         [name] ALPRAZolam (XANAX) 0.5 MG tablet   Oral   Take 1 tablet (0.5 mg total) by mouth at bedtime as needed for anxiety.   15 tablet   0   . citalopram (CELEXA) 20 MG tablet   Oral   Take 1 tablet (20 mg total) by mouth daily.   30 tablet   3   . indapamide (LOZOL) 2.5 MG tablet   Oral   Take 2.5  mg by mouth 2 (two) times daily.         . metFORMIN (GLUCOPHAGE) 500 MG tablet   Oral   Take 1 tablet (500 mg total) by mouth daily before lunch.   90 tablet   3    BP 117/63  Pulse 79  Resp 19  SpO2 98% Physical Exam  Constitutional: She is oriented to person, place, and time. She appears well-developed and well-nourished. No distress.  HENT:  Head: Normocephalic and atraumatic.  Right Ear: Hearing normal.  Left Ear: Hearing normal.  Nose: Nose normal.  Mouth/Throat: Oropharynx is clear and moist and mucous membranes are normal.  Eyes: Conjunctivae and EOM are normal. Pupils are equal, round, and reactive to light.  Neck: Normal range of motion. Neck supple.  Cardiovascular: Regular rhythm, S1 normal and S2 normal.  Exam reveals no gallop and no friction rub.   No murmur heard. Pulmonary/Chest: Effort normal and breath sounds normal. No respiratory distress. She exhibits no tenderness.   Abdominal: Soft. Normal appearance and bowel sounds are normal. There is no hepatosplenomegaly. There is no tenderness. There is no rebound, no guarding, no tenderness at McBurney's point and negative Murphy's sign. No hernia.  Musculoskeletal: Normal range of motion.  Neurological: She is alert and oriented to person, place, and time. She has normal strength. No cranial nerve deficit or sensory deficit. Coordination normal. GCS eye subscore is 4. GCS verbal subscore is 5. GCS motor subscore is 6.  Skin: Skin is warm, dry and intact. No rash noted. No cyanosis.  Psychiatric: Her speech is normal and behavior is normal. Thought content normal. Her mood appears anxious.    ED Course  Procedures (including critical care time)  EKG:  Date: 06/13/2013  Rate: 74  Rhythm: normal sinus rhythm  QRS Axis: normal  Intervals: normal  ST/T Wave abnormalities: normal  Conduction Disutrbances: none  Narrative Interpretation: unremarkable     Labs Reviewed  BASIC METABOLIC PANEL - Abnormal; Notable for the following:    Potassium 3.3 (*)    Glucose, Bld 114 (*)    GFR calc non Af Amer 75 (*)    GFR calc Af Amer 87 (*)    All other components within normal limits  CBC  PRO B NATRIURETIC PEPTIDE   Dg Chest Port 1 View  06/13/2013   *RADIOLOGY REPORT*  Clinical Data: Chest pain and shortness of breath  PORTABLE CHEST - 1 VIEW  Comparison: 03/01/2013  Findings: Artifact overlies chest.  Heart size is normal. Mediastinal shadows are normal.  The vascularity is normal.  Lungs are clear.  No effusions.  No bony abnormalities.  IMPRESSION: No active disease   Original Report Authenticated By: Paulina Fusi, M.D.   Diagnosis: Chest Pain  MDM  Patient comes to the ER with complaints of chest pain. Patient has been having pain for several days. Patient is atypical. It is present at rest and not worsened by exertion. Patient does not have any known history of cardiac disease. She does have a history of  anxiety and panic attack with recurrent presentations to the ER for chest pain secondary to her panic disorder. She did have a normal stress test one year ago.  Patient does have cardiac risk factors. She is a diabetic and has history of heart disease. Because of this, patient had a second troponin which was negative. Vital signs are normal, PERC/Wells criteria negative. Patient can follow up with primary care physician at [facility] tomorrow for repeat  evaluation.  Gilda Crease, MD 06/13/13 1924  Gilda Crease, MD 06/13/13 2144

## 2013-06-13 NOTE — ED Notes (Signed)
EKG shown to Dr. Blinda Leatherwood

## 2013-06-13 NOTE — ED Notes (Signed)
Pt c/o L anterior chest pain. Pt describes pain as intermittent and sharp. Pt states pain radiates to L shoulder. Pain since Sunday.  Pt appears anxious about pain. Pt denies nausea. States she feels hot. Pt has warm, dry skin.

## 2013-06-13 NOTE — ED Notes (Signed)
Pt states that on Sunday started having left sided chest pain that radiates down her left arm and did move to right side of chest but not at this time. Pt that she has anxiety but these symptoms are different. Pt been little dizzy, warm and sweating.  Pt states that last night her face had some muscle spasms.  Pt states the pain has eased off when she takes her aspirin.

## 2013-06-13 NOTE — ED Notes (Signed)
I stat troponin result was 0.01

## 2013-06-14 ENCOUNTER — Encounter: Payer: Self-pay | Admitting: Family Medicine

## 2013-06-14 ENCOUNTER — Ambulatory Visit (INDEPENDENT_AMBULATORY_CARE_PROVIDER_SITE_OTHER): Payer: 59 | Admitting: Family Medicine

## 2013-06-14 VITALS — BP 122/82 | HR 80 | Temp 98.3°F | Ht 69.0 in | Wt 264.0 lb

## 2013-06-14 DIAGNOSIS — F411 Generalized anxiety disorder: Secondary | ICD-10-CM

## 2013-06-14 DIAGNOSIS — F419 Anxiety disorder, unspecified: Secondary | ICD-10-CM

## 2013-06-14 DIAGNOSIS — M25511 Pain in right shoulder: Secondary | ICD-10-CM

## 2013-06-14 DIAGNOSIS — R0789 Other chest pain: Secondary | ICD-10-CM

## 2013-06-14 DIAGNOSIS — M7551 Bursitis of right shoulder: Secondary | ICD-10-CM | POA: Insufficient documentation

## 2013-06-14 DIAGNOSIS — M25519 Pain in unspecified shoulder: Secondary | ICD-10-CM

## 2013-06-14 LAB — POCT I-STAT TROPONIN I: Troponin i, poc: 0 ng/mL (ref 0.00–0.08)

## 2013-06-14 MED ORDER — IBUPROFEN 600 MG PO TABS: 600.0000 mg | ORAL_TABLET | Freq: Three times a day (TID) | ORAL | Status: DC | PRN

## 2013-06-14 MED ORDER — ALPRAZOLAM 0.5 MG PO TABS: 0.5000 mg | ORAL_TABLET | Freq: Two times a day (BID) | ORAL | Status: DC | PRN

## 2013-06-14 NOTE — Progress Notes (Signed)
Subjective:     Patient ID: Carolyn Holland, female   DOB: 1971/08/04, 42 y.o.   MRN: [id number]  HPI 42 year old female with a history of generalized anxiety disorder and panic attacks presents for follow visit to discuss the following:  #1 ED visit for chest pain: Patient presented to the ED yesterday with left-sided chest pain. She describes sharp chest pains that started 4 days ago associated with sharp right anterior shoulder pain. The pain was constant occurring at rest and with activity. She went to the ED she was worked up and had a normal EKG and chest x-ray. She had negative troponins x2. Her chest pain was atypical and believed to be secondary to her anxiety. She is advised to keep her followup appointment in the office. Today she denies chest pain. She was taking aspirin for the chest pain but does not take aspirin daily. She's also tried Tylenol for the pain with mild relief of symptoms.  #2 right shoulder pain: Patient's right shoulder pain started 5 days ago. The pain is anterior lateral. The pain is described as sharp worse with activity. Pain is elicited by abduction of the shoulder. This is associated tingling and numbness down the arm. The tingling and numbness has resolved. Patient has tried Tylenol and aspirin with mild to pearly for symptoms. She denies injury to the shoulder. She does have a history of posterior neck and upper back pain.  3. anxiety: Patient is to increase regarding her shoulder and chest pain. She never started Celexa as instructed. At that time her anxiety and improve. She is not taking Celexa or Xanax currently.  Review of Systems As per history of present illness    Objective:   Physical Exam BP 122/82  Pulse 80  Temp(Src) 98.3 F (36.8 C) (Oral)  Ht 5\' 9"  (1.753 m)  Wt 264 lb (119.75 kg)  BMI 38.97 kg/m2 General appearance: alert, cooperative and no distress Lungs: clear to auscultation bilaterally Heart: regular rate and rhythm, S1, S2 normal, no  murmur, click, rub or gallop Shoulder: Right Inspection reveals no abnormalities, atrophy or asymmetry. Palpation R lateral neck and tenderness over the bicipital groove. There is no tenderness over AC joint or bicipital groove.  ROM Neck: full.  Shoulder: full. Pain reproduced with abduction. .  Rotator cuff strength is normal 5/5 throughout. Negative Spurling.  No signs of impingement with negative Neer and Hawkin's tests, and negative empty can. No labral pathology noted with good stability. Normal scapular function observed. No painful arc and no drop arm sign.  GAD 7 score of 10. 0 to questions 5 and 6. 1 to question and 3. 2 to questions 1,  2 and 7. 3 to question  4. When asked about function patient says it is not difficult at all to function.     Assessment and Plan:

## 2013-06-14 NOTE — Patient Instructions (Addendum)
Carolyn Holland,  Please start celexa tomorrow take in the morning with xanax for the first week. You can also take xanax at night for the next week  needed.   Take ibuprofen for your shoulder pain. You can also try ice/cold pack 15 minutes 2-3 times daily.  F/u in 2 weeks.   Dr. Armen Pickup

## 2013-06-15 ENCOUNTER — Encounter: Payer: Self-pay | Admitting: Family Medicine

## 2013-06-15 DIAGNOSIS — R0789 Other chest pain: Secondary | ICD-10-CM | POA: Insufficient documentation

## 2013-06-15 NOTE — Assessment & Plan Note (Addendum)
A: Recent ED visit for atypical chest pain. Patient's pain has resolved. Hydrocet physically well-controlled diabetes and hypertension. Patient has no known history of CAD. P: Advised patient to take aspirin daily. Patient will need fasting lipid panel done to guide decision making regarding initiation of statin therapy.

## 2013-06-15 NOTE — Assessment & Plan Note (Signed)
A: Patient screen positive for mild anxiety. I discussed with her the benefits of treating his anxiety with SSRI. She worsens she did not start this is our last time because she is feeling better. However she is willing to start at this time and she recognizes that she is developing a pattern of recurrent periods of anxiety associated with somatic complaints. P:  Start Celexa 20 mg daily. Advised patient to start Celexa with Xanax 0.5 mg daily for the first 5-7 days  To prevent acute worsening of her anxiety. She is to followup with me in 2-3 weeks.

## 2013-06-15 NOTE — Assessment & Plan Note (Signed)
A: Right anterior shoulder pain consistent with biceps tendinitis. There is also likely a component of cervical radiculopathy patient has no radicular symptoms currently. P: Advised patient to start scheduled NSAID. Continue range of motion exercises. Patient may benefit from subacromial injection I will refer her to sports medicine for this as she has not tolerated injections very well in the past (very anxious during last in office knee injection and she nearly kicked me).

## 2013-08-23 ENCOUNTER — Ambulatory Visit (INDEPENDENT_AMBULATORY_CARE_PROVIDER_SITE_OTHER): Payer: 59 | Admitting: Family Medicine

## 2013-08-23 ENCOUNTER — Encounter: Payer: Self-pay | Admitting: Family Medicine

## 2013-08-23 VITALS — BP 113/66 | HR 83 | Ht 66.0 in | Wt 262.0 lb

## 2013-08-23 DIAGNOSIS — R519 Headache, unspecified: Secondary | ICD-10-CM

## 2013-08-23 DIAGNOSIS — R51 Headache: Secondary | ICD-10-CM

## 2013-08-23 DIAGNOSIS — M25562 Pain in left knee: Secondary | ICD-10-CM

## 2013-08-23 DIAGNOSIS — E559 Vitamin D deficiency, unspecified: Secondary | ICD-10-CM

## 2013-08-23 DIAGNOSIS — M67919 Unspecified disorder of synovium and tendon, unspecified shoulder: Secondary | ICD-10-CM

## 2013-08-23 DIAGNOSIS — M7551 Bursitis of right shoulder: Secondary | ICD-10-CM

## 2013-08-23 DIAGNOSIS — M25569 Pain in unspecified knee: Secondary | ICD-10-CM

## 2013-08-23 DIAGNOSIS — Z8639 Personal history of other endocrine, nutritional and metabolic disease: Secondary | ICD-10-CM

## 2013-08-23 DIAGNOSIS — M25561 Pain in right knee: Secondary | ICD-10-CM

## 2013-08-23 DIAGNOSIS — E669 Obesity, unspecified: Secondary | ICD-10-CM | POA: Insufficient documentation

## 2013-08-23 DIAGNOSIS — E119 Type 2 diabetes mellitus without complications: Secondary | ICD-10-CM

## 2013-08-23 LAB — CBC
HCT: 40.7 % (ref 36.0–46.0)
MCV: 79.8 fL (ref 78.0–100.0)
RBC: 5.1 MIL/uL (ref 3.87–5.11)
WBC: 6.3 10*3/uL (ref 4.0–10.5)

## 2013-08-23 LAB — COMPLETE METABOLIC PANEL WITH GFR
ALT: 10 U/L (ref 0–35)
AST: 14 U/L (ref 0–37)
Albumin: 4 g/dL (ref 3.5–5.2)
Alkaline Phosphatase: 37 U/L — ABNORMAL LOW (ref 39–117)
BUN: 13 mg/dL (ref 6–23)
Potassium: 3.8 mEq/L (ref 3.5–5.3)

## 2013-08-23 LAB — POCT GLYCOSYLATED HEMOGLOBIN (HGB A1C): Hemoglobin A1C: 6.2

## 2013-08-23 MED ORDER — METHYLPREDNISOLONE ACETATE 40 MG/ML IJ SUSP
40.0000 mg | Freq: Once | INTRAMUSCULAR | Status: AC
  Administered 2013-08-23: 40 mg via INTRA_ARTICULAR

## 2013-08-23 NOTE — Assessment & Plan Note (Signed)
Patient is currently in the process of getting surgical weight loss at [facility] by Dr. Lily Peer. Her initial evaluations 09/20/2013.

## 2013-08-23 NOTE — Assessment & Plan Note (Signed)
History of recurrent knee pain left greater than right consistent with also arthritis as well as Baker's cyst. No evidence of DVT.  Provider repeat corticosteroid injection. Weight loss: Patient is currently in the process of getting surgical weight loss at [facility] by Dr. Lily Peer. Her initial evaluations 09/20/2013.

## 2013-08-23 NOTE — Assessment & Plan Note (Signed)
Burning headaches concerning for trigeminal neuralgia versus tension headache. Tension arouse is less likely given the bilateral distribution of the pain as was the distribution in the posterior head which is innervated by the occipital nerve. Findings consistent with tension arouse and are twitching (facial nerve twitching). The patient's neurological exam is normal. I do believe all of her somatic complaints are exacerbated by her untreated anxiety (patient refusing to take pharmacotherapy for anxiety).  Plan is to check vitamin D and electrolytes. Patient may continue Motrin was offered some relief. If vitamin D and the left lites are normal and the patient is still having headache I will have her back to discuss treatment for trigeminal neuralgia.

## 2013-08-23 NOTE — Assessment & Plan Note (Addendum)
A: Physical exam findings and lack of injury are consistent with subacromial bursitis.  P: Continue Motrin when necessary. exercises provided from [facility] patient advisor referral to [facility] of shoulder U/S and likely injection of subacromial bursa.

## 2013-08-23 NOTE — Progress Notes (Signed)
Subjective:    Patient ID: Carolyn Holland, female    DOB: [birth year], 42 y.o.   MRN: [id number]  HPI 42 year old female with history of generalized anxiety disorder with panic attacks, vitamin D deficiency, hypertension, prediabetes and chronic bilateral knee pain presents for followup visits with the following:  #1 headache: Patient reports a burning sensation and located in the back of her head it and bilateral for head for the past 9 days. The sensation ranges from minutes to all day. The pain does not radiate.The patient does endorse lower lip twitching is occasionally associated with the head pain. There has been no preceding head trauma or scalp lesions. There is no associated fever.  She denies associated vision changes, hearing changes, nausea and vomiting. She takes Motrin for the pain which provides partial relief.   #2 left shoulder pain: Patient reports burning sensation in her left shoulder as well as her left anterior chest it occurs nightly. These sensations are chronic and tends to come and go. She has presented to the ED for this in the past but did not return because she is usually sent home without much of a workup. She has no pain currently. There is no associated nausea, vomiting times diaphoresis, shortness of breath, heartburn  #3 left knee pain: Patient has chronic left knee pain is a history of laparoscopic knee surgery in 2011 for bilateral meniscal tear. She reports her pain is returned for the past 2 weeks. The pain is associated with a fullness sensation in her knee, as well as pain with knee extension and flexion. She denies locking or popping. There been no new injuries. I performed a left knee corticosteroid injection on 04/24/2013 which afforded her 15 weeks of relief.  Medication review: Patient never started Celexa which was initially prescribed 01/11/2013.  Review of Systems As per history of present illness     Objective: BP 113/66  Pulse 83  Ht 5\' 6"  (1.676 m)   Wt 262 lb (118.842 kg)  BMI 42.31 kg/m2   Physical Exam  Constitutional: She is oriented to person, place, and time. She appears well-developed and well-nourished. No distress.  HENT:  Head: Normocephalic and atraumatic.  Right Ear: External ear normal.  Left Ear: External ear normal.  Mouth/Throat: Oropharynx is clear and moist.  Wearing a wig. Wig removed no underlying scalp lesions. Evidence of hair loss at the anterior hairline.  Eyes: Conjunctivae and EOM are normal. Pupils are equal, round, and reactive to light.  Fundoscopic exam:      The right eye shows no arteriolar narrowing, no AV nicking, no exudate, no hemorrhage and no papilledema. The right eye shows red reflex.       The left eye shows no arteriolar narrowing, no AV nicking, no exudate, no hemorrhage and no papilledema. The left eye shows red reflex.  Cardiovascular: Normal rate, regular rhythm, normal heart sounds and intact distal pulses.   Pulmonary/Chest: Effort normal and breath sounds normal.  Musculoskeletal: She exhibits edema.  Trace lower extremity edema bilaterally Varicose vein noted in left lower extremity.  Neurological: She is alert and oriented to person, place, and time. She has normal reflexes. She displays normal reflexes. No cranial nerve deficit. She exhibits normal muscle tone. Coordination normal.  Shoulder:Left  Inspection reveals no abnormalities, atrophy or asymmetry. Palpation is normal with no tenderness over AC joint, bicipital groove are subacromial space.  ROM is full Rotator cuff strength 5/5 throughout. Signs of  impingement with positive Neer and Hawkins. Normal scapular  function observed. No painful arc and no drop arm sign.  Knee: Left Normal to inspection with no erythema or effusion or obvious bony abnormalities. Palpation reveals joint line tenderness or patellar tenderness. No condyle tenderness. ROM full but painful extension. Flexion is limited to 100. Ligaments with  solid consistent endpoints including ACL, PCL, LCL, MCL. Negative Mcmurray's and provocative meniscal tests. Painful patellar compression. Patellar and quadriceps tendons unremarkable. Hamstring and quadriceps strength is normal.  After obtaining informed consent a steroid injection was performed at left knee using an anterior medial approach using 3 cc of 1% plain Lidocaine and 40 mg of Depo-Medrol. This was well tolerated.     Assessment & Plan:

## 2013-08-23 NOTE — Patient Instructions (Addendum)
Carolyn Holland,  Thank you for coming in today.  Regarding your knee: please take it easy and ice your knee today. 15 mins. Continue regular exercises.   Regarding your shoulder: please do rehab exercises provided.  I have referred you to sports medicine and  They will contact you.   Regarding headache and lip twitching: I am concerned for calcium and Vit D deficiency. This could also be a headache subtype called trigeminal neuralgia (less likely, usually one sided). Lab work today. I will be in touch with results and care plan going forward.   F/u with me in 4-6 weeks for well woman visit.   Dr. Armen Pickup

## 2013-08-24 ENCOUNTER — Telehealth: Payer: Self-pay | Admitting: *Deleted

## 2013-08-24 ENCOUNTER — Encounter: Payer: Self-pay | Admitting: Family Medicine

## 2013-08-24 MED ORDER — VITAMIN D (ERGOCALCIFEROL) 1.25 MG (50000 UNIT) PO CAPS: 50000.0000 [IU] | ORAL_CAPSULE | ORAL | Status: DC

## 2013-08-24 MED ORDER — CALCIUM CITRATE 200 MG PO TABS: 400.0000 mg | ORAL_TABLET | Freq: Every day | ORAL | Status: DC

## 2013-08-24 NOTE — Telephone Encounter (Signed)
Left message for pt to call back and get message from Dr. Armen Pickup.  Please give message when pt calls back.  Thanks [employer]

## 2013-08-24 NOTE — Telephone Encounter (Signed)
Message copied by Henri Medal on Fri Aug 24, 2013 12:13 PM ------      Message from: Dessa Phi      Created: Fri Aug 24, 2013  8:38 AM       Please call patient:            Normal non-fasting labs.      Persistent vitamin D deficiency.      She will need vitamin D 50,000 weekly for 8 weeks. Take with calcium citrate 400 mg BID. New Rx sent.       Results letter sent. ------

## 2013-08-24 NOTE — Addendum Note (Signed)
Addended by: Dessa Phi on: 08/24/2013 08:42 AM   Modules accepted: Orders

## 2013-08-24 NOTE — Assessment & Plan Note (Addendum)
Persistent deficiency.   Vit D 50,000 U weekly x 8 weeks Calcium citrate 400 mg daily.  Recheck in 8 weeks.

## 2013-09-07 ENCOUNTER — Ambulatory Visit: Payer: Self-pay | Admitting: Family Medicine

## 2013-09-14 ENCOUNTER — Ambulatory Visit (INDEPENDENT_AMBULATORY_CARE_PROVIDER_SITE_OTHER): Payer: 59 | Admitting: Family Medicine

## 2013-09-14 ENCOUNTER — Encounter: Payer: Self-pay | Admitting: Family Medicine

## 2013-09-14 ENCOUNTER — Ambulatory Visit (HOSPITAL_COMMUNITY)
Admission: RE | Admit: 2013-09-14 | Discharge: 2013-09-14 | Disposition: A | Discharge status: Home / Self Care | Payer: 59 | Source: Ambulatory Visit | Attending: Family Medicine | Admitting: Family Medicine

## 2013-09-14 VITALS — BP 120/72 | HR 88 | Temp 98.5°F | Ht 66.0 in | Wt 262.0 lb

## 2013-09-14 DIAGNOSIS — G4733 Obstructive sleep apnea (adult) (pediatric): Secondary | ICD-10-CM

## 2013-09-14 DIAGNOSIS — F411 Generalized anxiety disorder: Secondary | ICD-10-CM

## 2013-09-14 DIAGNOSIS — R079 Chest pain, unspecified: Secondary | ICD-10-CM

## 2013-09-14 DIAGNOSIS — F419 Anxiety disorder, unspecified: Secondary | ICD-10-CM

## 2013-09-14 DIAGNOSIS — R0789 Other chest pain: Secondary | ICD-10-CM | POA: Insufficient documentation

## 2013-09-14 DIAGNOSIS — R9431 Abnormal electrocardiogram [ECG] [EKG]: Secondary | ICD-10-CM | POA: Insufficient documentation

## 2013-09-14 MED ORDER — ALPRAZOLAM 0.5 MG PO TABS: 0.5000 mg | ORAL_TABLET | Freq: Two times a day (BID) | ORAL | Status: DC | PRN

## 2013-09-14 MED ORDER — CITALOPRAM HYDROBROMIDE 20 MG PO TABS: 20.0000 mg | ORAL_TABLET | Freq: Every day | ORAL | Status: DC

## 2013-09-14 NOTE — Assessment & Plan Note (Signed)
Remains untreated due to lack of followup.  Patient schedule for sleep study on 10/11/2013.

## 2013-09-14 NOTE — Patient Instructions (Addendum)
Carolyn Holland,  Thank you for coming in today. Your exam is reassuring.  You did have a normal stress test in April of 2013. Based on that, I do not believe a repeat stress test is the first thing we should do:  1. I recommend that you meet with our psychologist, Dr. Spero Geralds, for help dealing with your anxiety and resultant insomnia.  You can schedule an appointment with her by calling her directly at [phone]. 2. Please keep your appointment for your sleep study Nov 20th, this will allow me to order a new CPAP machine for you.  3. Start celexa take with xanax in the morning for the first 10 days. May take an additional dose of xanax at night as needed for the first 10 days as well.  Follow up with me in 3 weeks    Dr. Armen Pickup

## 2013-09-14 NOTE — Assessment & Plan Note (Signed)
A: The patient clearly anxious with multiple somatic complaints. None indicating a specific cardiac or neurological disease process. Her exam is reassuring. She had a normal stress echo 18 months ago. Clinically the patient has changed except her weight gain.  P: 1. I recommend that you meet with our psychologist, Dr. Spero Geralds, for help dealing with your anxiety and resultant insomnia.  You can schedule an appointment with her by calling her directly at [phone]. 2. Please keep your appointment for your sleep study Nov 20th, this will allow me to order a new CPAP machine for you.  3. Start celexa take with xanax in the morning for the first 10 days. May take an additional dose of xanax at night as needed for the first 10 days as well.  Follow up with me in 3 weeks

## 2013-09-14 NOTE — Progress Notes (Signed)
  Subjective:    Patient ID: Carolyn Holland, female    DOB: 1971-03-26, 42 y.o.   MRN: [id number]  HPI 42 year old female with no history of anxiety not on treatment, objective sleep apnea (no CPAP) 2 morbid obesity presents for same-day visit to discuss the following:  #1 right face twitching: Patient with 2 weeks of right face twitching that as worsening. The twitching is on the lateral aspect of her lower lip. The twitching is associated with a burning sensation in the back for pain for the past 2 days. She denies focal weakness or numbness. She is extremely worried about this. She feels that she is "sick". She worsens point that she is unable to sleep. She drinks about her sleep apparent because she is afraid of her symptoms. She has been prescribed Celexa by me in the past but never started it.   Other associated symptoms include burning sensation in her left anterior chest and left anterior arm that radiates down to her hand. Chest pain shortness of breath. Also symptoms occur at rest.  Positive CAD risk factors: Hypertension, diabetes (diet controlled), family history of CAD and stroke in her mother in her 31s (pertinent her mother was a smoker, EtOH drinker, and IDU),  Negative CAD risk factors: Patient is a chronic nonsmoker.  Cardiac workup: Patient had a negative stress echo in: 03/14/2012. All of patient's EKGs which she presents acutely chest pain have been normal.  #2 OSA:  new sleep study referral placed for the patient April 2014. She reports that she has not heard from them. Called the sleep study center and they report that he didn't call the patient were unable to leave a voicemail because her voice mail box was set up.   Review of Systems As per HPI  Chronic leg pain     Objective:   Physical Exam BP 120/72  Pulse 88  Temp(Src) 98.5 F (36.9 C) (Oral)  Ht 5\' 6"  (1.676 m)  Wt 262 lb (118.842 kg)  BMI 42.31 kg/m2 General appearance: alert, cooperative, no distress and  anxious appearing  Head: Normocephalic, without obvious abnormality, atraumatic Eyes: conjunctivae/corneas clear. PERRL, EOM's intact.  Ears: normal TM's and external ear canals both ears Nose: Nares normal. Septum midline. Mucosa normal. No drainage or sinus tenderness. Throat: lips, mucosa, and tongue normal; teeth and gums normal Lungs: clear to auscultation bilaterally Heart: regular rate and rhythm, S1, S2 normal, no murmur, click, rub or gallop Neurologic: Alert and oriented X 3, normal strength and tone.  Normal coordination and gait  EKG: normal EKG, normal sinus rhythm, unchanged from previous tracings.       Assessment & Plan:

## 2013-09-21 ENCOUNTER — Ambulatory Visit: Payer: Self-pay | Admitting: Family Medicine

## 2013-09-28 ENCOUNTER — Telehealth: Payer: Self-pay | Admitting: Family Medicine

## 2013-09-28 ENCOUNTER — Ambulatory Visit: Payer: Self-pay | Admitting: Family Medicine

## 2013-09-28 NOTE — Telephone Encounter (Signed)
Noted.  Thank you for the call

## 2013-09-28 NOTE — Telephone Encounter (Signed)
Due to an emergency with her daughter that just arrived from [place], she will not be able to keep her appt today.

## 2013-09-30 ENCOUNTER — Emergency Department (HOSPITAL_COMMUNITY)
Admission: EM | Admit: 2013-09-30 | Discharge: 2013-10-01 | Disposition: A | Discharge status: Home / Self Care | Payer: 59 | Attending: Emergency Medicine | Admitting: Emergency Medicine

## 2013-09-30 ENCOUNTER — Encounter (HOSPITAL_COMMUNITY): Payer: Self-pay | Admitting: Emergency Medicine

## 2013-09-30 DIAGNOSIS — M79609 Pain in unspecified limb: Secondary | ICD-10-CM | POA: Insufficient documentation

## 2013-09-30 DIAGNOSIS — Z79899 Other long term (current) drug therapy: Secondary | ICD-10-CM | POA: Insufficient documentation

## 2013-09-30 DIAGNOSIS — Z7982 Long term (current) use of aspirin: Secondary | ICD-10-CM | POA: Insufficient documentation

## 2013-09-30 DIAGNOSIS — M79605 Pain in left leg: Secondary | ICD-10-CM

## 2013-09-30 DIAGNOSIS — I10 Essential (primary) hypertension: Secondary | ICD-10-CM | POA: Insufficient documentation

## 2013-09-30 DIAGNOSIS — M79604 Pain in right leg: Secondary | ICD-10-CM

## 2013-09-30 DIAGNOSIS — F410 Panic disorder [episodic paroxysmal anxiety] without agoraphobia: Secondary | ICD-10-CM | POA: Insufficient documentation

## 2013-09-30 DIAGNOSIS — Z880 Allergy status to penicillin: Secondary | ICD-10-CM | POA: Insufficient documentation

## 2013-09-30 DIAGNOSIS — Z862 Personal history of diseases of the blood and blood-forming organs and certain disorders involving the immune mechanism: Secondary | ICD-10-CM | POA: Insufficient documentation

## 2013-09-30 DIAGNOSIS — Z8719 Personal history of other diseases of the digestive system: Secondary | ICD-10-CM | POA: Insufficient documentation

## 2013-09-30 NOTE — ED Notes (Signed)
Pt. States she has had pain her legs for several months from knees down. She states that in the last couple weeks the pain now goes up her left thigh also.  She had a torn Meniscus a year ago left knee which was repaired a year ago. She did get a cortisone shot in the left knee with short lived relief.

## 2013-10-01 LAB — GLUCOSE, CAPILLARY: Glucose-Capillary: 136 mg/dL — ABNORMAL HIGH (ref 70–99)

## 2013-10-01 MED ORDER — TRAMADOL HCL 50 MG PO TABS: 50.0000 mg | ORAL_TABLET | Freq: Four times a day (QID) | ORAL | Status: DC | PRN

## 2013-10-01 MED ORDER — LORAZEPAM 1 MG PO TABS: 1.0000 mg | ORAL_TABLET | Freq: Every evening | ORAL | Status: DC | PRN

## 2013-10-01 NOTE — ED Provider Notes (Signed)
Medical screening examination/treatment/procedure(s) were performed by non-physician practitioner and as supervising physician I was immediately available for consultation/collaboration.    Vida Roller, MD 10/01/13 [phone]

## 2013-10-01 NOTE — ED Provider Notes (Signed)
CSN: [id number]     Arrival date & time 09/30/13  2313 History   First MD Initiated Contact with Patient 09/30/13 2353     Chief Complaint  Patient presents with  . Leg Pain   (Consider location/radiation/quality/duration/timing/severity/associated sxs/prior Treatment) HPI Comments: Patient with bilateral lower, leg pain, exacerbated by ambulation he never really does disappears.  She has trouble sleeping at night, feels, that she must move her legs at all times.  Does not have any swelling.  She has a history of having had a meniscus tear in her left knee.  That was repaired over a year ago her job includes up and down quite a bit, some walking.  Does not include travel  Patient is a 42 y.o. female presenting with leg pain.  Leg Pain Location:  Leg Leg location:  L leg, R leg, L lower leg and R lower leg Pain details:    Quality:  Aching   Radiates to:  L flank   Severity:  Moderate   Timing:  Intermittent Chronicity:  Recurrent Dislocation: no   Foreign body present:  No foreign bodies Associated symptoms: no fever     Past Medical History  Diagnosis Date  . GERD (gastroesophageal reflux disease) 2000    come and go  . Sleep apnea 2012  . Anxiety 2012  . Panic attack 2012    situational related to move from [place].   [name] Hypertension 2008  . Bilateral anterior knee pain 2012  . Anemia 12/22/2012   Past Surgical History  Procedure Laterality Date  . Partial hysterectomy  2009  . Laproscopic knee surgery  2011    L knee for bilateral meniscal tear. Following acute injury.   . Abdominal hysterectomy    . Cesarean section  2002   Family History  Problem Relation Age of Onset  . Diabetes Mother   . Hypertension Mother   . Hypertension Father   . Heart disease Mother 10  . Stroke Mother 40    x 2  . Alcohol abuse Mother     previous   . Drug abuse Mother     previous cocaine    History  Substance Use Topics  . Smoking status: Never Smoker   . Smokeless  tobacco: Never Used  . Alcohol Use: Yes     Comment: occassional, social    OB History   Grav Para Term Preterm Abortions TAB SAB Ect Mult Living   4 3 3  1 1    3      Review of Systems  Constitutional: Negative for fever and chills.  Cardiovascular: Negative for chest pain and leg swelling.  Musculoskeletal: Positive for arthralgias. Negative for joint swelling.  Skin: Negative for rash and wound.  All other systems reviewed and are negative.    Allergies  Penicillins  Home Medications   Current Outpatient Rx  Name  Route  Sig  Dispense  Refill  . ALPRAZolam (XANAX) 0.5 MG tablet   Oral   Take 1 tablet (0.5 mg total) by mouth 2 (two) times daily as needed for sleep or anxiety.   20 tablet   0   . aspirin EC 81 MG tablet   Oral   Take 324 mg by mouth daily.         . Calcium Citrate 200 MG TABS   Oral   Take 2 tablets (400 mg total) by mouth daily.   60 tablet   0   . citalopram (CELEXA) 20 MG  tablet   Oral   Take 1 tablet (20 mg total) by mouth daily.   30 tablet   0   . ibuprofen (ADVIL,MOTRIN) 600 MG tablet   Oral   Take 1 tablet (600 mg total) by mouth every 8 (eight) hours as needed for pain.   60 tablet   0   . indapamide (LOZOL) 2.5 MG tablet   Oral   Take 2.5 mg by mouth 2 (two) times daily.         [name] LORazepam (ATIVAN) 1 MG tablet   Oral   Take 1 tablet (1 mg total) by mouth at bedtime as needed for anxiety (restless leg discomfort).   15 tablet   0   . metFORMIN (GLUCOPHAGE) 500 MG tablet   Oral   Take 1 tablet (500 mg total) by mouth daily before lunch.   90 tablet   3   . Vitamin D, Ergocalciferol, (DRISDOL) 50000 UNITS CAPS capsule   Oral   Take 1 capsule (50,000 Units total) by mouth every 7 (seven) days.   30 capsule   0    BP 129/87  Temp(Src) 97.9 F (36.6 C) (Oral)  Resp 18  SpO2 96% Physical Exam  Nursing note and vitals reviewed. Constitutional: She appears well-developed and well-nourished.  HENT:  Head:  Normocephalic.  Eyes: Pupils are equal, round, and reactive to light.  Neck: Normal range of motion.  Cardiovascular: Normal rate and regular rhythm.   Pulmonary/Chest: Effort normal.  Abdominal: Soft.  Musculoskeletal: Normal range of motion. She exhibits no edema and no tenderness.  Neurological: She is alert.  Skin: Skin is warm.    ED Course  Procedures (including critical care time) Labs Review Labs Reviewed - No data to display Imaging Review No results found.  EKG Interpretation   None       MDM   1. Leg pain, bilateral     This has been placed in TED hose given a prescription for Ativan that she should take at night, only for restless leg.  Followup with her primary care physician    Arman Filter, NP 10/01/13 [phone]

## 2013-10-04 DIAGNOSIS — K219 Gastro-esophageal reflux disease without esophagitis: Secondary | ICD-10-CM | POA: Insufficient documentation

## 2013-10-11 ENCOUNTER — Encounter (HOSPITAL_BASED_OUTPATIENT_CLINIC_OR_DEPARTMENT_OTHER): Payer: Self-pay

## 2013-10-25 ENCOUNTER — Institutional Professional Consult (permissible substitution): Payer: Self-pay | Admitting: Pulmonary Disease

## 2013-10-30 ENCOUNTER — Telehealth: Payer: Self-pay | Admitting: Family Medicine

## 2013-10-30 NOTE — Telephone Encounter (Signed)
Ms. Barbee calling to ask for a MRI for her legs.  Wanted to be sent for this asap.    Please call to discuss

## 2013-10-31 NOTE — Telephone Encounter (Signed)
Patient will need an OV to discuss this and for PE.  Please call her back and facilitate this.

## 2013-10-31 NOTE — Telephone Encounter (Addendum)
Pain from knees down to shins and up thighs.  Pain been bad for about three weeks.  L side is worse. Also on the R.  Both sides involved.  Hurts all days.   No swelling in lower legs.   Plan: Does not sound like blood clot. Patient advised to take 600 mg of ibuprofen TID Patient to see me in f/u on Friday. Plan to order b/l knee MRI +/- doppler Likely refer to ortho.   Patient reassured and amenable to plan.

## 2013-10-31 NOTE — Telephone Encounter (Signed)
Has appt on Friday. Carolyn Holland, Carolyn Holland

## 2013-11-02 ENCOUNTER — Ambulatory Visit (INDEPENDENT_AMBULATORY_CARE_PROVIDER_SITE_OTHER): Payer: 59 | Admitting: Family Medicine

## 2013-11-02 ENCOUNTER — Encounter: Payer: Self-pay | Admitting: Family Medicine

## 2013-11-02 VITALS — BP 134/85 | HR 132 | Temp 97.9°F | Wt 267.0 lb

## 2013-11-02 DIAGNOSIS — M25569 Pain in unspecified knee: Secondary | ICD-10-CM

## 2013-11-02 DIAGNOSIS — M25561 Pain in right knee: Secondary | ICD-10-CM

## 2013-11-02 DIAGNOSIS — R609 Edema, unspecified: Secondary | ICD-10-CM

## 2013-11-02 DIAGNOSIS — R600 Localized edema: Secondary | ICD-10-CM

## 2013-11-02 DIAGNOSIS — F419 Anxiety disorder, unspecified: Secondary | ICD-10-CM

## 2013-11-02 DIAGNOSIS — F411 Generalized anxiety disorder: Secondary | ICD-10-CM

## 2013-11-02 MED ORDER — ALPRAZOLAM 0.5 MG PO TABS: 0.5000 mg | ORAL_TABLET | Freq: Two times a day (BID) | ORAL | Status: DC | PRN

## 2013-11-02 NOTE — Patient Instructions (Addendum)
Ms. Presswood,  Thank you for coming in to see me today.   I have ordered lower extremity dopplers to rule out DVT and as well as bilateral knee MRI.  Please continue to take ibuprofen 600 mg TID with meals.  I will be in touch with you when I have the results.   I have refilled your xanax, 20 pills.   Dr. Armen Pickup

## 2013-11-02 NOTE — Assessment & Plan Note (Addendum)
A: worsening pain despite conservative measures and intra articular injections. P: MRI of both knees to eval Anticipate ortho referral Continue NSAID and BZ for pain control  Weight loss.

## 2013-11-02 NOTE — Progress Notes (Signed)
   Subjective:    Patient ID: Carolyn Holland, female    DOB: Apr 09, 1971, 42 y.o.   MRN: [id number] CC: bilateral knee pain and leg pain  HPI 42 yo F with history of morbid obesity, NIDDM type 2, HTN and bilateral knee DJD s/p L knee laparoscopy for meniscal tear following an acute injury in 2011 presents for f/u:  1. Knee pain: L >R worsening. Associated with pain that radiates superiorly and inferiorly. The patient's pain is worsening. It is exacerbated by prolonged sitting or standing, knee flexion on the R and L and knee extension on the L. She is noticing L lateral hip pain as well. She is also noticing worsening LE edema and pretibial TTP. She has had multiple trips to the ED and seen me in the office multiple times over the past year for her knee pain. She is s/p multiple corticosteroid knee injections. She is working towards weight loss surgery. She takes NSAIDs for her pain. She did not take the ativan or tramadol prescribed to her at the ED.   Pain is interfering with sleep and her ability to work.   Soc: non smoker Meds: reviewed. Stopped BP medication. Last dose two days ago.   Review of Systems As per HPI     Objective:   Physical Exam BP 134/85  Pulse 132  Temp(Src) 97.9 F (36.6 C) (Oral)  Wt 267 lb (121.11 kg) General appearance: alert, cooperative, no distress and morbidly obese Extremities: edema 1 + b/l LE pretibial edema. Tender varicose veins on the L anterior leg.   L Knee: Inspection: Obese, no erythema or effusion or obvious bony abnormalities. Palpation: no warmth, medial and lateral joint line tenderness, patellar tenderness. Popliteal fossa fullness and TPP. No condyle tenderness. ROM: 0-90 degrees. Significant pain with flexion and extension.  Ligaments with solid consistent endpoints including ACL, PCL, LCL, MCL. Equivocal Mcmurray's and negative provocative meniscal tests. Painful patellar compression. Patellar and quadriceps tendons  unremarkable. Hamstring and quadriceps strength is normal.  Negative log roll and FABER  R Knee:  Inspection: Obese, no erythema or effusion or obvious bony abnormalities. Palpation: no warmth, medial and lateral joint line tenderness. Mild  patellar tenderness. Popliteal fossa non tender. No condyle tenderness. ROM: 0-100 degrees. Mild ain with flexion and extension.  Ligaments with solid consistent endpoints including ACL, PCL, LCL, MCL. Equivocal Mcmurray's and negative provocative meniscal tests. Mildly Painful patellar compression. Patellar and quadriceps tendons unremarkable. Hamstring and quadriceps strength is normal.  Negative log roll and FABER     Assessment & Plan:

## 2013-11-02 NOTE — Assessment & Plan Note (Signed)
A: L> R edema. Suspect multifactorial from obesity, no anemia. Rule out DVT P: LE dopplers ordered.

## 2013-11-08 ENCOUNTER — Ambulatory Visit (HOSPITAL_BASED_OUTPATIENT_CLINIC_OR_DEPARTMENT_OTHER): Payer: 59 | Attending: Family Medicine | Admitting: Radiology

## 2013-11-08 VITALS — Ht 66.0 in | Wt 267.0 lb

## 2013-11-08 DIAGNOSIS — G4733 Obstructive sleep apnea (adult) (pediatric): Secondary | ICD-10-CM | POA: Insufficient documentation

## 2013-11-10 DIAGNOSIS — G4733 Obstructive sleep apnea (adult) (pediatric): Secondary | ICD-10-CM

## 2013-11-10 NOTE — Sleep Study (Signed)
   NAME: Carolyn Holland DATE OF BIRTH:  May 08, 1971 MEDICAL RECORD NUMBER [id number]  LOCATION: [facility]  PHYSICIAN: YOUNG,CLINTON D  DATE OF STUDY: 11/08/2013  SLEEP STUDY TYPE: Nocturnal Polysomnogram               REFERRING PHYSICIAN: Sanjuana Letters,*  INDICATION FOR STUDY: Hypersomnia with sleep apnea. Outside records from previous nocturnal polysomnogram on 11/09/2010 recorded AHI 10 per hour with BMI 43. Subsequent CPAP titration on 11/06/2011 to 8 CWP recorded residual AHI 16 per hour.  EPWORTH SLEEPINESS SCORE:   4/24 HEIGHT: 5\' 6"  (167.6 cm)  WEIGHT: 267 lb (121.11 kg)    Body mass index is 43.12 kg/(m^2).  NECK SIZE: 15 in.  MEDICATIONS: Charted for review  SLEEP ARCHITECTURE: Total sleep time 361 minutes with sleep efficiency 96.7%. Stage I was 4.8%, stage II 79.6%, stage III absent, REM 15.5% of total sleep time. Sleep latency 1 minute, REM latency 107 minutes, awake after sleep onset 11 minutes, arousal index 8.3. Bedtime medication: Xanax.  RESPIRATORY DATA: Apnea hypopnea index (AHI) 9.5 per hour. A total of 57 events were scored including 2 obstructive apneas and 55 hypopneas. Events were not positional. REM AHI 55.7 per hour. There were not enough early events to permit application of split protocol CPAP titration.  OXYGEN DATA: Snoring ranged from mild to loud with oxygen desaturation to a nadir of 78% and mean oxygen saturation through the study of 92.5% on room air  CARDIAC DATA: Normal sinus rhythm  MOVEMENT/PARASOMNIA: No significant movement disturbance, no bathroom trips.  IMPRESSION/ RECOMMENDATION:   1) Mild obstructive sleep apnea/hypopnea syndrome, AHI 9.5 per hour with non-positional events. Mild to occasionally loud snoring with oxygen desaturation to a nadir of 78% and mean oxygen saturation through the study of 92.5% on room air. 2) There were not enough events to permit application of split CPAP titration protocol. If  conservative measures aren't sufficient, this patient can return for dedicated CPAP titration study if appropriate. 3) A baseline nocturnal polysomnogram recorded elsewhere on 11/09/2010 indicated AHI 10 per hour. BMI was 43. Subsequent CPAP titration on 11/06/2011 record CPAP 8 with AHI 16 per hour residual.   Signed Jetty Duhamel M.D. Waymon Budge Diplomate, American Board of Sleep Medicine  ELECTRONICALLY SIGNED ON:  11/10/2013, 5:03 PM [facility] PH: [phone]   FX: [phone] ACCREDITED BY THE AMERICAN ACADEMY OF SLEEP MEDICINE

## 2013-11-13 ENCOUNTER — Emergency Department (HOSPITAL_COMMUNITY): Payer: 59

## 2013-11-13 ENCOUNTER — Encounter (HOSPITAL_COMMUNITY): Payer: Self-pay | Admitting: Emergency Medicine

## 2013-11-13 ENCOUNTER — Emergency Department (HOSPITAL_COMMUNITY)
Admission: EM | Admit: 2013-11-13 | Discharge: 2013-11-13 | Disposition: A | Discharge status: Home / Self Care | Payer: 59 | Attending: Emergency Medicine | Admitting: Emergency Medicine

## 2013-11-13 ENCOUNTER — Telehealth: Payer: Self-pay | Admitting: Family Medicine

## 2013-11-13 DIAGNOSIS — Z9889 Other specified postprocedural states: Secondary | ICD-10-CM | POA: Insufficient documentation

## 2013-11-13 DIAGNOSIS — Z862 Personal history of diseases of the blood and blood-forming organs and certain disorders involving the immune mechanism: Secondary | ICD-10-CM | POA: Insufficient documentation

## 2013-11-13 DIAGNOSIS — R030 Elevated blood-pressure reading, without diagnosis of hypertension: Secondary | ICD-10-CM

## 2013-11-13 DIAGNOSIS — R51 Headache: Secondary | ICD-10-CM | POA: Insufficient documentation

## 2013-11-13 DIAGNOSIS — H9209 Otalgia, unspecified ear: Secondary | ICD-10-CM | POA: Insufficient documentation

## 2013-11-13 DIAGNOSIS — I10 Essential (primary) hypertension: Secondary | ICD-10-CM | POA: Insufficient documentation

## 2013-11-13 DIAGNOSIS — R079 Chest pain, unspecified: Secondary | ICD-10-CM

## 2013-11-13 DIAGNOSIS — G4730 Sleep apnea, unspecified: Secondary | ICD-10-CM | POA: Insufficient documentation

## 2013-11-13 DIAGNOSIS — R609 Edema, unspecified: Secondary | ICD-10-CM | POA: Insufficient documentation

## 2013-11-13 DIAGNOSIS — Z79899 Other long term (current) drug therapy: Secondary | ICD-10-CM | POA: Insufficient documentation

## 2013-11-13 DIAGNOSIS — M79609 Pain in unspecified limb: Secondary | ICD-10-CM | POA: Insufficient documentation

## 2013-11-13 DIAGNOSIS — Z880 Allergy status to penicillin: Secondary | ICD-10-CM | POA: Insufficient documentation

## 2013-11-13 DIAGNOSIS — R6884 Jaw pain: Secondary | ICD-10-CM | POA: Insufficient documentation

## 2013-11-13 DIAGNOSIS — Z8719 Personal history of other diseases of the digestive system: Secondary | ICD-10-CM | POA: Insufficient documentation

## 2013-11-13 DIAGNOSIS — R110 Nausea: Secondary | ICD-10-CM | POA: Insufficient documentation

## 2013-11-13 DIAGNOSIS — F410 Panic disorder [episodic paroxysmal anxiety] without agoraphobia: Secondary | ICD-10-CM | POA: Insufficient documentation

## 2013-11-13 DIAGNOSIS — R600 Localized edema: Secondary | ICD-10-CM

## 2013-11-13 LAB — COMPREHENSIVE METABOLIC PANEL
ALT: 12 U/L (ref 0–35)
AST: 15 U/L (ref 0–37)
CO2: 25 mEq/L (ref 19–32)
Calcium: 9.4 mg/dL (ref 8.4–10.5)
Chloride: 101 mEq/L (ref 96–112)
Creatinine, Ser: 0.81 mg/dL (ref 0.50–1.10)
GFR calc Af Amer: >90 mL/min (ref >90)
GFR calc non Af Amer: 88 mL/min — ABNORMAL LOW (ref >90)
Glucose, Bld: 115 mg/dL — ABNORMAL HIGH (ref 70–99)
Total Bilirubin: 0.7 mg/dL (ref 0.3–1.2)

## 2013-11-13 LAB — CBC
Hemoglobin: 13.4 g/dL (ref 12.0–15.0)
MCH: 28.2 pg (ref 26.0–34.0)
MCV: 82.6 fL (ref 78.0–100.0)
Platelets: 247 10*3/uL (ref 150–400)
RBC: 4.76 MIL/uL (ref 3.87–5.11)
RDW: 13.1 % (ref 11.5–15.5)
WBC: 8.3 10*3/uL (ref 4.0–10.5)

## 2013-11-13 LAB — POCT I-STAT TROPONIN I: Troponin i, poc: 0 ng/mL (ref 0.00–0.08)

## 2013-11-13 NOTE — ED Provider Notes (Signed)
CSN: [id number]     Arrival date & time 11/13/13  1728 History   First MD Initiated Contact with Patient 11/13/13 1922     Chief Complaint  Patient presents with  . Leg Pain  . Jaw Pain  . Shortness of Breath   (Consider location/radiation/quality/duration/timing/severity/associated sxs/prior Treatment) HPI Comments: Carolyn Holland is a 42 year-old female with a past medical history of anxiety, HTN, DM, OSA, morbid obesity, presenting the Emergency Department with a chief complaint of bilateral lower extremity swelling.  She reports onset of swelling was several months ago.  She reports she has been evaluated by the her PCP and in the ED for the same complaint.  She reports an increase in her Left lower extremity pain. She reports left knee pain and has an out patient MRI scheduled.  The patient states she was advise to get TED or compression stockings, she states she never went to the pharmacy or medical supply to purchase them. She also complains of two days of intermittent chest discomfort which she describes as palpitations with associated dyspnea.  She reports discomfort lasting for 30 minutes and subsiding on their own.  She sates "it could just be anxiety, but I did not want to drop dead at home".No recent travel, family history or personal history of DVT/PE, lower extremity swelling, smoking, cancer, or exogenous estrogen.  She reports left jaw pain for several days. No history of fever, tooth pain, dental injury, or sore throat.     The history is provided by the patient and medical records. No language interpreter was used.    Past Medical History  Diagnosis Date  . GERD (gastroesophageal reflux disease) 2000    come and go  . Sleep apnea 2012  . Anxiety 2012  . Panic attack 2012    situational related to move from [place].   [name] Hypertension 2008  . Bilateral anterior knee pain 2012  . Anemia 12/22/2012   Past Surgical History  Procedure Laterality Date  . Partial  hysterectomy  2009  . Laproscopic knee surgery  2011    L knee for bilateral meniscal tear. Following acute injury.   . Abdominal hysterectomy    . Cesarean section  2002   Family History  Problem Relation Age of Onset  . Diabetes Mother   . Hypertension Mother   . Hypertension Father   . Heart disease Mother 36  . Stroke Mother 40    x 2  . Alcohol abuse Mother     previous   . Drug abuse Mother     previous cocaine    History  Substance Use Topics  . Smoking status: Never Smoker   . Smokeless tobacco: Never Used  . Alcohol Use: Yes     Comment: occassional, social    OB History   Grav Para Term Preterm Abortions TAB SAB Ect Mult Living   4 3 3  1 1    3      Review of Systems  Constitutional: Negative for fever, chills and diaphoresis.  HENT: Positive for ear pain. Negative for mouth sores, rhinorrhea, sore throat, tinnitus, trouble swallowing and voice change.   Eyes: Negative for visual disturbance.  Respiratory: Negative for cough and wheezing.   Gastrointestinal: Positive for nausea. Negative for vomiting and abdominal pain.  Genitourinary: Negative for dysuria and hematuria.  Musculoskeletal: Negative for neck pain and neck stiffness.  Skin: Negative for rash.  Neurological: Positive for headaches. Negative for syncope, light-headedness and numbness.  Hematological: Negative  for adenopathy.    Allergies  Penicillins  Home Medications   Current Outpatient Rx  Name  Route  Sig  Dispense  Refill  . ALPRAZolam (XANAX) 0.5 MG tablet   Oral   Take 1 tablet (0.5 mg total) by mouth 2 (two) times daily as needed for sleep or anxiety.   20 tablet   0   . ibuprofen (ADVIL,MOTRIN) 600 MG tablet   Oral   Take 1 tablet (600 mg total) by mouth every 8 (eight) hours as needed for pain.   60 tablet   0   . indapamide (LOZOL) 2.5 MG tablet   Oral   Take 2.5 mg by mouth 2 (two) times daily.         . Calcium Citrate 200 MG TABS   Oral   Take 2 tablets (400 mg  total) by mouth daily.   60 tablet   0   . Vitamin D, Ergocalciferol, (DRISDOL) 50000 UNITS CAPS capsule   Oral   Take 1 capsule (50,000 Units total) by mouth every 7 (seven) days.   30 capsule   0    BP 143/92  Pulse 74  Temp(Src) 98.9 F (37.2 C) (Oral)  Resp 19  SpO2 98% Physical Exam  Nursing note and vitals reviewed. Constitutional: She is oriented to person, place, and time. She appears well-developed and well-nourished. No distress.  Abdominal exam limited by patient's body habitus.    HENT:  Head: Normocephalic and atraumatic.    Right Ear: No drainage or tenderness. Tympanic membrane is not injected. No middle ear effusion.  Left Ear: External ear normal. No drainage or tenderness. Tympanic membrane is not injected.  No middle ear effusion.  Ears:  Mouth/Throat: Uvula is midline and oropharynx is clear and moist. No oral lesions. No trismus in the jaw. No dental caries.  Right TM with a yellow tinted material behind it. No, air fluid level. Left mandible tender to palpation.  No swelling noted. No crepitus to the TMJ.  Eyes: EOM are normal. Pupils are equal, round, and reactive to light. Right eye exhibits no discharge. Left eye exhibits no discharge.  Neck: Neck supple.  Cardiovascular: Normal rate and regular rhythm.   Pulmonary/Chest: Effort normal and breath sounds normal. No respiratory distress. She has no wheezes. She has no rales.  Abdominal: Soft. Bowel sounds are normal. She exhibits no distension. There is no tenderness. There is no rebound and no guarding.  Musculoskeletal:  Equal bilateral lower extremity +1 pitting edema just distal the knee.  Neurological: She is alert and oriented to person, place, and time.  Skin: Skin is warm and dry.  Psychiatric: She has a normal mood and affect. Her behavior is normal.    ED Course  Procedures (including critical care time) Labs Review Labs Reviewed - No data to display Imaging Review No results  found.  EKG Interpretation    Date/Time:  Tuesday November 13 2013 17:40:40 EST Ventricular Rate:  67 PR Interval:  174 QRS Duration: 86 QT Interval:  394 QTC Calculation: 416 R Axis:   61 Text Interpretation:  Sinus rhythm Borderline T abnormalities, anterior leads No significant change was found Confirmed by DOCHERTY  MD, MEGAN ([id number]) on 11/13/2013 5:43:44 PM            MDM   1. Bilateral leg edema   2. Chest pain   3. Elevated blood pressure reading    Patient with several complains, equal bilateral lower extremity swelling, jaw pain, and  episodes of shortness of breath with tachycardia.  PERC negative, Oxygen 100%, cardiovascular exam without decreased lung sounds or tachycardia, DVT/PE less likely.  D-dimer sent. Labs sent.  EMR revals patient was evaluated in the ED recently for similar complaints, no concern for DVT/ PE during that encounter. Discussed patient history, condition with Dr. Effie Shy.  After his evaluation of the patient he agrees on current work up,. D-dimer negative, electrolytes without an attributing cause, no anemia.Discussed patient history, condition, and labs with Dr. Effie Shy who agrees the patient can be evaluated as an out-pt. Discussed lab results, imaging results, and treatment plan with the patient. Return precautions given. Reports understanding and no other concerns at this time.  Patient is stable for discharge at this time.   Meds given in ED:  Medications - No data to display  Discharge Medication List as of 11/13/2013 10:10 PM          Leotis Shames Doretha Imus, PA-C 11/16/13 0981

## 2013-11-13 NOTE — Telephone Encounter (Signed)
Spoke with Alvino Chapel at [employer] and Berkley Harvey #s were given to her for pt's appt on Friday. Thor Nannini,CMA

## 2013-11-13 NOTE — ED Notes (Signed)
Pt c/o leg pain and swelling x "months".  States that she woke up this morning with jaw pain, headache, and SOB that "comes and goes".  Only hx is HTN.

## 2013-11-13 NOTE — Telephone Encounter (Signed)
Called [employer] [phone], option #3 for  Physician to physician discussion for b/l knee MRI. Notification #'s received:  [id number]  R [id number]  Please inform patient and give her the above notification #'s.

## 2013-11-13 NOTE — ED Provider Notes (Addendum)
  Face-to-face evaluation   History: She has ongoing leg swelling for several months. She is concerned about chest discomfort, with radiation of the discomfort to her jaw, a nonproductive cough, and intermittent shortness of breath. She seen her PCP, recently and been scheduled for MRI of her lower extremities, as well as bilateral  duplex imaging.  Physical exam: Alert, obese female in mild discomfort. She is anxious. Lungs clear without wheezes, rales, or rhonchi. Heart regular rate and rhythm, without murmurs. Legs- moderate peripheral edema of the lower extremities, bilaterally, symmetric.  Right TM has scarring versus cholesteatoma of the superior aspect. Lower TM appears normal, translucent, not retracted or bulging.  Medical screening examination/treatment/procedure(s) were conducted as a shared visit with non-physician practitioner(s) and myself.  I personally evaluated the patient during the encounter  Flint Melter, MD 11/16/13 1437  Note pending  Flint Melter, MD 11/17/13 1425

## 2013-11-16 ENCOUNTER — Ambulatory Visit
Admission: RE | Admit: 2013-11-16 | Discharge: 2013-11-16 | Disposition: A | Discharge status: Home / Self Care | Payer: 59 | Source: Ambulatory Visit | Attending: Family Medicine | Admitting: Family Medicine

## 2013-11-16 ENCOUNTER — Other Ambulatory Visit: Payer: Self-pay

## 2013-11-16 DIAGNOSIS — M25561 Pain in right knee: Secondary | ICD-10-CM

## 2013-11-16 DIAGNOSIS — R600 Localized edema: Secondary | ICD-10-CM

## 2013-11-19 ENCOUNTER — Telehealth: Payer: Self-pay | Admitting: Family Medicine

## 2013-11-19 ENCOUNTER — Encounter: Payer: Self-pay | Admitting: Family Medicine

## 2013-11-19 DIAGNOSIS — M25561 Pain in right knee: Secondary | ICD-10-CM

## 2013-11-19 DIAGNOSIS — I8390 Asymptomatic varicose veins of unspecified lower extremity: Secondary | ICD-10-CM

## 2013-11-19 DIAGNOSIS — R600 Localized edema: Secondary | ICD-10-CM

## 2013-11-19 NOTE — Telephone Encounter (Signed)
Rule out DVT 

## 2013-11-19 NOTE — Telephone Encounter (Signed)
Pt is aware of appt for [place] 11/23/13 @10 :30am.  Jazmin Hartsell,CMA

## 2013-11-19 NOTE — Telephone Encounter (Signed)
Called patient. Reviewed MRI  Plan: Ortho referral placed. appt on Thursday or Friday preferred. LE vein reflux study ordered. Patient did not get dopplers. Blue team please schedule and inform patient of appt details.

## 2013-11-19 NOTE — Telephone Encounter (Signed)
Spoke with [name] at [facility] and [place] was cancelled due to not knowing what was being ruled out.  She needs to know if this is needed for venous insufficiencies or DVT.  This will determine how it is scheduled.  She can be reached at [phone].  Please advise. Jazmin Hartsell,CMA

## 2013-11-19 NOTE — Telephone Encounter (Signed)
Carolyn Holland appt is 12/06/2013 @3 :15pm with Dr. Rayburn Ma and pt is aware. Carolyn Holland,CMA

## 2013-11-19 NOTE — Telephone Encounter (Signed)
Left a message for [name] at [facility].  Informed of reason for venous doppler.  Asked her to call us back to let us know if she can schedule that with pt.  Jazmin Hartsell,CMA

## 2013-11-23 ENCOUNTER — Ambulatory Visit: Admission: RE | Admit: 2013-11-23 | Payer: 59 | Source: Ambulatory Visit

## 2013-11-23 ENCOUNTER — Other Ambulatory Visit: Payer: 59

## 2013-11-26 ENCOUNTER — Telehealth: Payer: Self-pay | Admitting: Family Medicine

## 2013-11-26 DIAGNOSIS — M25511 Pain in right shoulder: Secondary | ICD-10-CM

## 2013-11-26 MED ORDER — IBUPROFEN 600 MG PO TABS: 600.0000 mg | ORAL_TABLET | Freq: Three times a day (TID) | ORAL | Status: DC | PRN

## 2013-11-26 NOTE — Telephone Encounter (Signed)
No answer and no machine. Carolyn Holland  

## 2013-11-26 NOTE — Telephone Encounter (Signed)
Refil request for Ibuprofen 600 mg. Please call patient once RX has been sent.

## 2013-11-26 NOTE — Telephone Encounter (Signed)
Done

## 2013-12-06 ENCOUNTER — Ambulatory Visit
Admission: RE | Admit: 2013-12-06 | Discharge: 2013-12-06 | Disposition: A | Discharge status: Home / Self Care | Payer: 59 | Source: Ambulatory Visit | Attending: Family Medicine | Admitting: Family Medicine

## 2013-12-06 DIAGNOSIS — R600 Localized edema: Secondary | ICD-10-CM

## 2013-12-06 DIAGNOSIS — I8390 Asymptomatic varicose veins of unspecified lower extremity: Secondary | ICD-10-CM

## 2013-12-11 ENCOUNTER — Telehealth: Payer: Self-pay | Admitting: Family Medicine

## 2013-12-11 DIAGNOSIS — I872 Venous insufficiency (chronic) (peripheral): Secondary | ICD-10-CM | POA: Insufficient documentation

## 2013-12-11 NOTE — Telephone Encounter (Signed)
Called patient to give LE doppler results. Patient is aware has a treatment plan with vascular.   Patient informed me that she is having hot and cold flashes off and on for year, getting worse. Also with weakness and dizziness and nausea over past 3 days. No fever, vomiting, diarrhea or definite sick contacts. Concerned about HIV, last sex partner over 6 months ago.  A: Sounds like hot flashes Possible acute viral illness  P: F/u in office, recommended patient call and get on my schedule on 12/14/13. She agreed with plan and voiced understanding.

## 2013-12-13 ENCOUNTER — Encounter: Payer: Self-pay | Admitting: Family Medicine

## 2013-12-14 ENCOUNTER — Other Ambulatory Visit (HOSPITAL_COMMUNITY)
Admission: RE | Admit: 2013-12-14 | Discharge: 2013-12-14 | Disposition: A | Discharge status: Home / Self Care | Payer: 59 | Source: Ambulatory Visit | Attending: Family Medicine | Admitting: Family Medicine

## 2013-12-14 ENCOUNTER — Encounter: Payer: Self-pay | Admitting: Family Medicine

## 2013-12-14 ENCOUNTER — Ambulatory Visit (INDEPENDENT_AMBULATORY_CARE_PROVIDER_SITE_OTHER): Payer: 59 | Admitting: Family Medicine

## 2013-12-14 VITALS — BP 128/90 | HR 90 | Temp 98.1°F | Ht 66.0 in | Wt 263.4 lb

## 2013-12-14 DIAGNOSIS — IMO0002 Reserved for concepts with insufficient information to code with codable children: Secondary | ICD-10-CM | POA: Insufficient documentation

## 2013-12-14 DIAGNOSIS — Z124 Encounter for screening for malignant neoplasm of cervix: Secondary | ICD-10-CM | POA: Insufficient documentation

## 2013-12-14 DIAGNOSIS — N760 Acute vaginitis: Secondary | ICD-10-CM

## 2013-12-14 DIAGNOSIS — R300 Dysuria: Secondary | ICD-10-CM

## 2013-12-14 DIAGNOSIS — Z1151 Encounter for screening for human papillomavirus (HPV): Secondary | ICD-10-CM | POA: Insufficient documentation

## 2013-12-14 DIAGNOSIS — R82998 Other abnormal findings in urine: Secondary | ICD-10-CM | POA: Insufficient documentation

## 2013-12-14 DIAGNOSIS — R232 Flushing: Secondary | ICD-10-CM | POA: Insufficient documentation

## 2013-12-14 DIAGNOSIS — R87810 Cervical high risk human papillomavirus (HPV) DNA test positive: Secondary | ICD-10-CM | POA: Insufficient documentation

## 2013-12-14 DIAGNOSIS — N951 Menopausal and female climacteric states: Secondary | ICD-10-CM

## 2013-12-14 DIAGNOSIS — Z113 Encounter for screening for infections with a predominantly sexual mode of transmission: Secondary | ICD-10-CM | POA: Insufficient documentation

## 2013-12-14 LAB — POCT URINALYSIS DIPSTICK
Bilirubin, UA: NEGATIVE
Blood, UA: NEGATIVE
Glucose, UA: NEGATIVE
KETONES UA: NEGATIVE
Leukocytes, UA: NEGATIVE
Nitrite, UA: NEGATIVE
PROTEIN UA: NEGATIVE
Spec Grav, UA: 1.025
UROBILINOGEN UA: 0.2
pH, UA: 6.5

## 2013-12-14 LAB — POCT WET PREP (WET MOUNT): Clue Cells Wet Prep Whiff POC: NEGATIVE

## 2013-12-14 LAB — TSH: TSH: 0.697 u[IU]/mL (ref 0.350–4.500)

## 2013-12-14 NOTE — Patient Instructions (Signed)
Carolyn Holland,  Thank you for coming in today. Your urinalysis is normal. I will be in touch with wet prep, pap results and blood work results.  Your symptoms sound like early menopause with hot flashes. For now, avoid hot flashes tiggers.   Dr. Adrian Blackwater

## 2013-12-14 NOTE — Assessment & Plan Note (Signed)
A: hot flashes and insomnia consistent with early menopause.  P: FSH Beta HCG, prolactin, TSH to rule out secondary causes of ovarian failure.  Reassurance-handout given Avoid triggers-caffeine, chocolate, spicy foods, stress

## 2013-12-14 NOTE — Assessment & Plan Note (Signed)
A: concerns for dark urine. No UTI symptoms. UA done. wnl P: reassurance

## 2013-12-14 NOTE — Progress Notes (Signed)
   Subjective:    Patient ID: Sedona Wenk, female    DOB: Sep 07, 1971, 43 y.o.   MRN: [id number]  HPI 43 yo F presents for f/u visit:  1. Pap smear: patient is past due for screening pap. She also request STD testing. She has not history of abnormal pap smears. She is currently not sexually active.   2. Dark urine: x 2 weeks. No dysuria, frequency, urgency.  3. Hot flashes: is having hot and cold flashes off and on for year, getting worse. Occurs day and night. Heat starts at neck and forehead, followed by chills. Also with dizziness and poor sleep. No fever, vomiting, diarrhea or definite sick contacts.  Review of Systems As per HPI     Objective:   Physical Exam BP 128/90  Pulse 90  Temp(Src) 98.1 F (36.7 C) (Oral)  Ht 5\' 6"  (1.676 m)  Wt 263 lb 6.4 oz (119.477 kg)  BMI 42.53 kg/m2 General appearance: alert, cooperative and no distress Abdomen: soft, non-tender; bowel sounds normal; no masses,  no organomegaly Pelvic: external genitalia normal thinning a pelvic hair noted, normal labia. No CMT on bimanual exam. Uterus surgically absent. Vagina with scant mucoid vaginal disharge. No lesions. Cervix normal w/o lesions.  Pap and wet prep done.  UA done and negative.        Assessment & Plan:

## 2013-12-14 NOTE — Assessment & Plan Note (Addendum)
Pap done today. Wet prep also done and negative.  Results: ASCUS with high risk HPV on co-testing. Colposcopy recommended. Will discuss the patient via phone.

## 2013-12-15 LAB — PROLACTIN: Prolactin: 4.5 ng/mL

## 2013-12-15 LAB — HCG, SERUM, QUALITATIVE: PREG SERUM: NEGATIVE

## 2013-12-15 LAB — FOLLICLE STIMULATING HORMONE: FSH: 65.1 m[IU]/mL

## 2013-12-17 ENCOUNTER — Telehealth: Payer: Self-pay | Admitting: *Deleted

## 2013-12-17 NOTE — Telephone Encounter (Signed)
Message copied by Johny Shears on Mon Dec 17, 2013  9:12 AM ------      Message from: Boykin Nearing      Created: Fri Dec 14, 2013 12:31 PM       Please inform patient.       Negative UA.      Clue cells on wet prep with negative whiff.      Clue cells are seen with bacterial vaginosis.      Since discharge was minimal and there was no odor. I recommend not treating.      Treatment would be flagyl 500 mg PO BID for one week.        ------

## 2013-12-18 ENCOUNTER — Telehealth: Payer: Self-pay | Admitting: Family Medicine

## 2013-12-18 ENCOUNTER — Encounter: Payer: Self-pay | Admitting: Family Medicine

## 2013-12-18 DIAGNOSIS — M25511 Pain in right shoulder: Secondary | ICD-10-CM

## 2013-12-18 MED ORDER — IBUPROFEN 600 MG PO TABS: 600.0000 mg | ORAL_TABLET | Freq: Three times a day (TID) | ORAL | Status: DC | PRN

## 2013-12-18 NOTE — Telephone Encounter (Signed)
Patient called.

## 2013-12-18 NOTE — Telephone Encounter (Signed)
Spoke with patient and informed her of below. She is wanting to know other lab results that were done that day

## 2013-12-18 NOTE — Telephone Encounter (Signed)
Called patient Carolyn Holland is high consistent with menopausal state All other labs normal.  Negative GC and chlamydia.  Results letter sent. Plan to not treat clue cells on wet prep.

## 2013-12-18 NOTE — Telephone Encounter (Signed)
Called patient ASCUS with high risk HPV on recent pap. Plan: patient to be scheduled in colpo clinic when we have new scope. Described what ASCUS means and what a colposcopy is like to the patient.  Patient request Thursday or Friday anytime. These are her days off from work. Will route to my support staff to schedule and call patient with details.

## 2013-12-19 ENCOUNTER — Telehealth: Payer: Self-pay | Admitting: Family Medicine

## 2013-12-19 NOTE — Telephone Encounter (Signed)
Attempted to call patient back. A man named Carolyn Holland answered and reported that she was not there at the moment. Will call patient back at home tomorrow afternoon on her day off.

## 2013-12-19 NOTE — Telephone Encounter (Signed)
Pt called and would like Dr. Adrian Blackwater to call her because she has some questions that need more answers. Please call her at her work number [phone]. Carolyn Holland

## 2013-12-19 NOTE — Telephone Encounter (Signed)
Records faxed to [facility].  Carolyn Holland,CMA

## 2013-12-19 NOTE — Telephone Encounter (Signed)
LM for pt to call back.  Needs an appt to see colpo clinic but I am waiting to hear from Dr. Nori Riis when we might be getting our new scope in.  Please advise pt that I will call back with an appt date and time after I speak with her.  She is in clinic tomorrow and I will ask her then. Bethany Cumming,CMA

## 2013-12-19 NOTE — Telephone Encounter (Signed)
Pt called back to ask that Dr. Adrian Blackwater fax over her recent GYN visit and Labs to [facility] office attention Silva Bandy [phone]. She has a 10:00 am appointment tomorrow 01/28. jw

## 2013-12-20 NOTE — Telephone Encounter (Signed)
Patient made an appt with a gynecologist to f/u her abnormal pap smear. She is going to [employer].  We discussed her pap results again. She is anxious to have a colposcopy done soon. She is going to [employer].

## 2013-12-21 ENCOUNTER — Other Ambulatory Visit: Payer: Self-pay | Admitting: Obstetrics and Gynecology

## 2013-12-21 ENCOUNTER — Other Ambulatory Visit: Payer: Self-pay

## 2013-12-21 DIAGNOSIS — Z1231 Encounter for screening mammogram for malignant neoplasm of breast: Secondary | ICD-10-CM

## 2013-12-27 ENCOUNTER — Other Ambulatory Visit: Payer: Self-pay | Admitting: Obstetrics and Gynecology

## 2014-01-03 ENCOUNTER — Ambulatory Visit: Payer: 59

## 2014-01-03 LAB — COMPLETE METABOLIC PANEL WITH GFR
ALK PHOS: 50 U/L
ALT: 13 U/L (ref 7–35)
AST: 15 U/L
Albumin: 4
BUN, Bld: 13
CO2: 28 mmol/L
Calcium: 10.3 mg/dL
Chloride: 100 mmol/L
Creat: 0.76
Est. average glucose Bld gHb Est-mCnc: 169
GFR, Est Non African American: >89
Glucose: 125
Hgb A1c MFr Bld: 7.5 % — AB (ref 4.0–6.0)
Potassium: 3.7 mmol/L
SODIUM: 137 mmol/L (ref 137–147)
Total Bilirubin: 0.8 mg/dL
Total Protein: 7 g/dL

## 2014-01-17 ENCOUNTER — Ambulatory Visit: Payer: 59

## 2014-01-18 ENCOUNTER — Encounter: Payer: Self-pay | Admitting: Family Medicine

## 2014-01-18 DIAGNOSIS — IMO0002 Reserved for concepts with insufficient information to code with codable children: Secondary | ICD-10-CM

## 2014-01-25 ENCOUNTER — Ambulatory Visit: Payer: 59

## 2014-02-07 ENCOUNTER — Other Ambulatory Visit (HOSPITAL_COMMUNITY): Payer: Self-pay | Admitting: Diagnostic Radiology

## 2014-02-07 ENCOUNTER — Telehealth: Payer: Self-pay | Admitting: Emergency Medicine

## 2014-02-07 DIAGNOSIS — I872 Venous insufficiency (chronic) (peripheral): Secondary | ICD-10-CM

## 2014-02-07 NOTE — Telephone Encounter (Signed)
CALLED PT TO MAKE HER AWARE THAT HER [employer] INS. DENIED HER X2 FOR VEIN TX.  DR HENN WANTS TO SEE HER BACK AT 3MOS TO F/U AND DOCUMENT COMPRESSION STOCKING USAGE AND SYMPTOMS.  APPT MADE FOR 03-07-14 AT 100/145PM

## 2014-03-07 ENCOUNTER — Other Ambulatory Visit: Payer: 59

## 2014-03-26 ENCOUNTER — Telehealth: Payer: Self-pay | Admitting: Family Medicine

## 2014-03-26 DIAGNOSIS — M25562 Pain in left knee: Principal | ICD-10-CM

## 2014-03-26 DIAGNOSIS — M25561 Pain in right knee: Secondary | ICD-10-CM

## 2014-03-26 NOTE — Telephone Encounter (Signed)
Need a new referral sent to a different orthopedic provider.  Please call patient back at the number given for discussion

## 2014-03-27 NOTE — Telephone Encounter (Signed)
Patient again today. Would like the referral send to Dr. Fonnie Jarvis. Please fax referral, xrays and notes to [phone].

## 2014-03-27 NOTE — Telephone Encounter (Signed)
Noted please inform patient that referral order has been placed. Instructions to fax along images and related office notes sent along to referral coordinator.

## 2014-03-29 ENCOUNTER — Ambulatory Visit: Payer: 59 | Admitting: Family Medicine

## 2014-04-16 NOTE — Telephone Encounter (Signed)
Everything was faxed.  Dr. Viona Gilmore office will contact pt.  [place]

## 2014-04-17 ENCOUNTER — Telehealth: Payer: Self-pay | Admitting: *Deleted

## 2014-04-17 NOTE — Telephone Encounter (Signed)
Received refill request for metformin 500 mg tab from [facility].  Medication is not listed on current med list.  Please advise when medication was discontinued.  Medication was last filled on 11/26/2013.  Derl Barrow, RN

## 2014-04-18 ENCOUNTER — Ambulatory Visit: Payer: 59 | Admitting: Family Medicine

## 2014-04-19 MED ORDER — METFORMIN HCL 500 MG PO TABS: 500.0000 mg | ORAL_TABLET | Freq: Two times a day (BID) | ORAL | Status: DC

## 2014-04-19 NOTE — Telephone Encounter (Signed)
Metformin refilled

## 2014-05-06 ENCOUNTER — Other Ambulatory Visit: Payer: Self-pay | Admitting: Family Medicine

## 2014-05-06 NOTE — Telephone Encounter (Signed)
Patient took last dose today. Thought that pharmacy sent request along with Metformin on 04/17/14. Please send asap.

## 2014-05-09 ENCOUNTER — Encounter: Payer: Self-pay | Admitting: Family Medicine

## 2014-05-09 ENCOUNTER — Ambulatory Visit (INDEPENDENT_AMBULATORY_CARE_PROVIDER_SITE_OTHER): Payer: 59 | Admitting: Family Medicine

## 2014-05-09 VITALS — BP 132/88 | HR 88 | Temp 98.1°F | Ht 66.0 in | Wt 270.5 lb

## 2014-05-09 DIAGNOSIS — I10 Essential (primary) hypertension: Secondary | ICD-10-CM

## 2014-05-09 DIAGNOSIS — E119 Type 2 diabetes mellitus without complications: Secondary | ICD-10-CM

## 2014-05-09 LAB — BASIC METABOLIC PANEL
BUN: 17 mg/dL (ref 6–23)
CO2: 26 meq/L (ref 19–32)
Calcium: 9.6 mg/dL (ref 8.4–10.5)
Chloride: 100 mEq/L (ref 96–112)
Creat: 0.83 mg/dL (ref 0.50–1.10)
GLUCOSE: 159 mg/dL — AB (ref 70–99)
POTASSIUM: 3.8 meq/L (ref 3.5–5.3)
SODIUM: 138 meq/L (ref 135–145)

## 2014-05-09 MED ORDER — HYDROCHLOROTHIAZIDE 12.5 MG PO CAPS: 12.5000 mg | ORAL_CAPSULE | Freq: Every day | ORAL | Status: DC

## 2014-05-09 NOTE — Assessment & Plan Note (Signed)
A: due for repeat A1c. Recently restarted metformin. P: For diabetes and weight: continue metformin. Come in next month for repeat A1c and meet new MD.   Checking Cr and fasting blood sugar today.

## 2014-05-09 NOTE — Assessment & Plan Note (Signed)
For swelling: please stop lozol. HCTZ 12.5 mg daily. If you respond well to this the dose can be increase to 25 mg daily.

## 2014-05-09 NOTE — Progress Notes (Signed)
   Subjective:    Patient ID: Carolyn Holland, female    DOB: [birth year], 43 y.o.   MRN: [id number] CC: leg swelling, knee swelling.  HPI 43 yo F with history of obesity, anxiety untreated, chronic pains in knees presents for SD visit:  1. Leg swelling: both legs, feet, ankles. No CP or SOB. Improving. Stopped lozol for a while. Has restarted. Gained weight when she stopped metformin.   2. DM2: now taking metformin. Gaining weight. No vision changes, no  Tingling/numbness in extremities.   Soc hx: non smoker  Review of Systems As per HPI     Objective:   Physical Exam BP 132/88  Pulse 88  Temp(Src) 98.1 F (36.7 C) (Oral)  Ht 5\' 6"  (1.676 m)  Wt 270 lb 8 oz (122.698 kg)  BMI 43.68 kg/m2 Wt Readings from Last 3 Encounters:  05/09/14 270 lb 8 oz (122.698 kg)  12/14/13 263 lb 6.4 oz (119.477 kg)  12/06/13 267 lb (121.11 kg)  General appearance: alert, cooperative, no distress and moderately obese Extremities: edema 1 + b/l LE      Assessment & Plan:

## 2014-05-09 NOTE — Patient Instructions (Addendum)
Carolyn Holland,  Thank you for coming in today.  For swelling: please stop lozol. HCTZ 12.5 mg daily. If you respond well to this the dose can be increase to 25 mg daily. For diabetes and weight: continue metformin. Come in next month for repeat A1c and meet new MD.   Checking Cr and fasting blood sugar today.  Dr. Adrian Blackwater

## 2014-05-13 ENCOUNTER — Encounter: Payer: Self-pay | Admitting: *Deleted

## 2014-06-06 ENCOUNTER — Ambulatory Visit (INDEPENDENT_AMBULATORY_CARE_PROVIDER_SITE_OTHER): Payer: 59 | Admitting: Family Medicine

## 2014-06-06 ENCOUNTER — Encounter: Payer: Self-pay | Admitting: Family Medicine

## 2014-06-06 VITALS — BP 123/86 | HR 73 | Ht 66.0 in | Wt 275.0 lb

## 2014-06-06 DIAGNOSIS — M79609 Pain in unspecified limb: Secondary | ICD-10-CM

## 2014-06-06 DIAGNOSIS — R42 Dizziness and giddiness: Secondary | ICD-10-CM

## 2014-06-06 DIAGNOSIS — M79672 Pain in left foot: Secondary | ICD-10-CM

## 2014-06-06 DIAGNOSIS — M79671 Pain in right foot: Secondary | ICD-10-CM

## 2014-06-06 LAB — POCT GLYCOSYLATED HEMOGLOBIN (HGB A1C): HEMOGLOBIN A1C: 7.6

## 2014-06-06 MED ORDER — ACETAMINOPHEN 325 MG PO TABS: 650.0000 mg | ORAL_TABLET | Freq: Four times a day (QID) | ORAL | Status: DC | PRN

## 2014-06-06 NOTE — Progress Notes (Signed)
Patient ID: Carolyn Holland, female   DOB: [birth year], 43 y.o.   MRN: [id number]  HPI:  Pt presents for same day appointment to discuss dizziness and bilateral feet pain.  Dizziness - has felt lightheaded, not spinning sensation. Able to eat and drink but has a decreased appetite. No shortness of breath or chest pain. No vomiting but has felt nauseated. No diarrhea, abdominal pain, syncope. She does endorse having not been staying hydrated well over the last few days. Has had dark urine. Takes indapamide for HTN.   Foot pain - both of her feet have hurt on the dorsal surface for the last 2 days. R worse than L. No new swelling, no injuries. Worse with standing. Nothing makes it better. Has not tried icing her feet. Has hx of bilateral knee osteoarthritis and has been getting synvisc injections at ortho. Due for f/u there. Has not tried any medicines for this pain.  ROS: See HPI  [place]: anxiety, HTN, T2DM, OSA, obesity  PHYSICAL EXAM: BP 113/70  Pulse 74  Ht 5\' 6"  (1.676 m)  Wt 275 lb (124.739 kg)  BMI 44.41 kg/m2 Gen: NAD, pleasant, cooperative HEENT: NCAT, mildly dry mucous membranes Heart: RRR, no murmurs Lungs: CTAB, NWOB Abdomen: soft, nontender to palpation Neuro: grossly nonfocal, speech normal Ext: 2+ edema bilat lower extremities. bilat feet with full strength on plantarflexion, dorsiflexion, inversion, eversion. Sensation intact to light touch over both feet. 2+ DP pulses bilat. Feet are nontender to palpation.  ASSESSMENT/PLAN:  # Bilateral foot pain: no clear etiology by hx or exam. Suspect related to chronic arthritis of knees and resultant subtle gait changes. Will start with conservative therapy with tylenol and icing the feet. Pt agreeable to this plan. F/u with PCP in 1-2 weeks to evaluate for improvement.  See problem based charting for additional assessment/plan.  FOLLOW UP: F/u in 1-2 weeks to meet new PCP and f/u on lightheadedness & foot pain.   [place].  Ardelia Mems, [place]

## 2014-06-06 NOTE — Patient Instructions (Signed)
It was nice to meet you today!  We are checking labwork today. Please drink plenty of fluids.  For your foot, use tylenol. I sent in a prescription. Also try icing it.  Follow up with your new primary doctor in the next 1-2 weeks to get established and f/u on these issues.  Be well, Dr. Ardelia Mems

## 2014-06-07 DIAGNOSIS — R42 Dizziness and giddiness: Secondary | ICD-10-CM | POA: Insufficient documentation

## 2014-06-07 LAB — CBC
HEMATOCRIT: 39.5 % (ref 36.0–46.0)
Hemoglobin: 13.6 g/dL (ref 12.0–15.0)
MCH: 28.5 pg (ref 26.0–34.0)
MCHC: 34.4 g/dL (ref 30.0–36.0)
MCV: 82.8 fL (ref 78.0–100.0)
PLATELETS: 227 10*3/uL (ref 150–400)
RBC: 4.77 MIL/uL (ref 3.87–5.11)
RDW: 13.6 % (ref 11.5–15.5)
WBC: 7.4 10*3/uL (ref 4.0–10.5)

## 2014-06-07 LAB — COMPREHENSIVE METABOLIC PANEL
ALT: 12 U/L (ref 0–35)
AST: 11 U/L (ref 0–37)
Albumin: 3.6 g/dL (ref 3.5–5.2)
Alkaline Phosphatase: 59 U/L (ref 39–117)
BILIRUBIN TOTAL: 0.7 mg/dL (ref 0.2–1.2)
BUN: 12 mg/dL (ref 6–23)
CO2: 29 meq/L (ref 19–32)
CREATININE: 0.81 mg/dL (ref 0.50–1.10)
Calcium: 9.4 mg/dL (ref 8.4–10.5)
Chloride: 101 mEq/L (ref 96–112)
Glucose, Bld: 130 mg/dL — ABNORMAL HIGH (ref 70–99)
Potassium: 3.7 mEq/L (ref 3.5–5.3)
Sodium: 138 mEq/L (ref 135–145)
Total Protein: 6.6 g/dL (ref 6.0–8.3)

## 2014-06-07 NOTE — Assessment & Plan Note (Signed)
Suspect mild dehydration as pt endorses not staying hydrated with fluids over last few days. Does not seem systemically ill, well appearing at this time. Orthostatics negative in clinic today. Recommend hydration with plenty of fluids. Will check CBC, CMET, A1c today. F/u in 1-2 weeks to meet new PCP and f/u on lightheadedness.

## 2014-06-12 ENCOUNTER — Telehealth: Payer: Self-pay | Admitting: Family Medicine

## 2014-06-12 NOTE — Telephone Encounter (Signed)
Kidneys, liver, and blood counts all normal. Please call pt to inform. She should still follow up with her PCP Dr. Dianah Field to meet new PCP and discuss diabetes.  Leeanne Rio, MD

## 2014-06-12 NOTE — Telephone Encounter (Signed)
Pt called and would like someone to call her with her test results. Please call her at work today [phone]. She goes to lunch from 12-1. jw

## 2014-06-13 NOTE — Telephone Encounter (Signed)
Attempted to call patient, but cannot accept calls at this time

## 2014-06-14 NOTE — Telephone Encounter (Signed)
Spoke with patient and informed her of below 

## 2014-09-23 ENCOUNTER — Encounter: Payer: Self-pay | Admitting: Family Medicine

## 2014-10-01 ENCOUNTER — Encounter (HOSPITAL_COMMUNITY): Payer: Self-pay | Admitting: Emergency Medicine

## 2014-10-01 ENCOUNTER — Emergency Department (HOSPITAL_COMMUNITY)
Admission: EM | Admit: 2014-10-01 | Discharge: 2014-10-02 | Disposition: A | Discharge status: Home / Self Care | Payer: 59 | Attending: Emergency Medicine | Admitting: Emergency Medicine

## 2014-10-01 DIAGNOSIS — Z79899 Other long term (current) drug therapy: Secondary | ICD-10-CM | POA: Insufficient documentation

## 2014-10-01 DIAGNOSIS — Z7982 Long term (current) use of aspirin: Secondary | ICD-10-CM | POA: Insufficient documentation

## 2014-10-01 DIAGNOSIS — Z8669 Personal history of other diseases of the nervous system and sense organs: Secondary | ICD-10-CM | POA: Diagnosis not present

## 2014-10-01 DIAGNOSIS — Z862 Personal history of diseases of the blood and blood-forming organs and certain disorders involving the immune mechanism: Secondary | ICD-10-CM | POA: Diagnosis not present

## 2014-10-01 DIAGNOSIS — Z8719 Personal history of other diseases of the digestive system: Secondary | ICD-10-CM | POA: Diagnosis not present

## 2014-10-01 DIAGNOSIS — R0602 Shortness of breath: Secondary | ICD-10-CM | POA: Diagnosis not present

## 2014-10-01 DIAGNOSIS — I10 Essential (primary) hypertension: Secondary | ICD-10-CM | POA: Insufficient documentation

## 2014-10-01 DIAGNOSIS — Z880 Allergy status to penicillin: Secondary | ICD-10-CM | POA: Insufficient documentation

## 2014-10-01 DIAGNOSIS — R079 Chest pain, unspecified: Secondary | ICD-10-CM

## 2014-10-01 DIAGNOSIS — F419 Anxiety disorder, unspecified: Secondary | ICD-10-CM | POA: Diagnosis not present

## 2014-10-01 DIAGNOSIS — R0789 Other chest pain: Secondary | ICD-10-CM | POA: Insufficient documentation

## 2014-10-01 NOTE — ED Notes (Addendum)
Pt presents with heaviness to upper chest with [facility] intermittent x 2 weeks. Tonight began having burning sensation to L arm and numbness to L arm. Pt states she did have stress test 1 month ago. Pt did take ASA 325mg  this am. Pt noted to be taking intentional deep breaths between sentences.

## 2014-10-02 ENCOUNTER — Emergency Department (HOSPITAL_COMMUNITY): Payer: 59

## 2014-10-02 LAB — BASIC METABOLIC PANEL
Anion gap: 10 (ref 5–15)
BUN: 13 mg/dL (ref 6–23)
CO2: 27 mEq/L (ref 19–32)
Calcium: 9.5 mg/dL (ref 8.4–10.5)
Chloride: 103 mEq/L (ref 96–112)
Creatinine, Ser: 0.87 mg/dL (ref 0.50–1.10)
GFR, EST NON AFRICAN AMERICAN: 80 mL/min — AB (ref >90)
Glucose, Bld: 204 mg/dL — ABNORMAL HIGH (ref 70–99)
Potassium: 3.7 mEq/L (ref 3.7–5.3)
Sodium: 140 mEq/L (ref 137–147)

## 2014-10-02 LAB — CBC
HCT: 40.4 % (ref 36.0–46.0)
Hemoglobin: 13.6 g/dL (ref 12.0–15.0)
MCH: 28.1 pg (ref 26.0–34.0)
MCHC: 33.7 g/dL (ref 30.0–36.0)
MCV: 83.5 fL (ref 78.0–100.0)
PLATELETS: 247 10*3/uL (ref 150–400)
RBC: 4.84 MIL/uL (ref 3.87–5.11)
RDW: 12.7 % (ref 11.5–15.5)
WBC: 6.8 10*3/uL (ref 4.0–10.5)

## 2014-10-02 LAB — PRO B NATRIURETIC PEPTIDE: Pro B Natriuretic peptide (BNP): 16.9 pg/mL (ref 0–125)

## 2014-10-02 LAB — I-STAT TROPONIN, ED: TROPONIN I, POC: 0.01 ng/mL (ref 0.00–0.08)

## 2014-10-02 MED ORDER — LORAZEPAM 1 MG PO TABS: 1.0000 mg | ORAL_TABLET | Freq: Three times a day (TID) | ORAL | Status: DC | PRN

## 2014-10-02 MED ORDER — OMEPRAZOLE 20 MG PO CPDR: 20.0000 mg | DELAYED_RELEASE_CAPSULE | Freq: Every day | ORAL | Status: DC

## 2014-10-02 MED ORDER — ALUM & MAG HYDROXIDE-SIMETH 200-200-20 MG/5ML PO SUSP
15.0000 mL | Freq: Once | ORAL | Status: AC
  Administered 2014-10-02: 15 mL via ORAL
  Filled 2014-10-02: qty 30

## 2014-10-02 NOTE — Discharge Instructions (Signed)
Chest Pain (Nonspecific) °It is often hard to give a specific diagnosis for the cause of chest pain. There is always a chance that your pain could be related to something serious, such as a heart attack or a blood clot in the lungs. You need to follow up with your health care provider for further evaluation. °CAUSES  °· Heartburn. °· Pneumonia or bronchitis. °· Anxiety or stress. °· Inflammation around your heart (pericarditis) or lung (pleuritis or pleurisy). °· A blood clot in the lung. °· A collapsed lung (pneumothorax). It can develop suddenly on its own (spontaneous pneumothorax) or from trauma to the chest. °· Shingles infection (herpes zoster virus). °The chest wall is composed of bones, muscles, and cartilage. Any of these can be the source of the pain. °· The bones can be bruised by injury. °· The muscles or cartilage can be strained by coughing or overwork. °· The cartilage can be affected by inflammation and become sore (costochondritis). °DIAGNOSIS  °Lab tests or other studies may be needed to find the cause of your pain. Your health care provider may have you take a test called an ambulatory electrocardiogram (ECG). An ECG records your heartbeat patterns over a 24-hour period. You may also have other tests, such as: °· Transthoracic echocardiogram (TTE). During echocardiography, sound waves are used to evaluate how blood flows through your heart. °· Transesophageal echocardiogram (TEE). °· Cardiac monitoring. This allows your health care provider to monitor your heart rate and rhythm in real time. °· Holter monitor. This is a portable device that records your heartbeat and can help diagnose heart arrhythmias. It allows your health care provider to track your heart activity for several days, if needed. °· Stress tests by exercise or by giving medicine that makes the heart beat faster. °TREATMENT  °· Treatment depends on what may be causing your chest pain. Treatment may include: °¨ Acid blockers for  heartburn. °¨ Anti-inflammatory medicine. °¨ Pain medicine for inflammatory conditions. °¨ Antibiotics if an infection is present. °· You may be advised to change lifestyle habits. This includes stopping smoking and avoiding alcohol, caffeine, and chocolate. °· You may be advised to keep your head raised (elevated) when sleeping. This reduces the chance of acid going backward from your stomach into your esophagus. °Most of the time, nonspecific chest pain will improve within 2-3 days with rest and mild pain medicine.  °HOME CARE INSTRUCTIONS  °· If antibiotics were prescribed, take them as directed. Finish them even if you start to feel better. °· For the next few days, avoid physical activities that bring on chest pain. Continue physical activities as directed. °· Do not use any tobacco products, including cigarettes, chewing tobacco, or electronic cigarettes. °· Avoid drinking alcohol. °· Only take medicine as directed by your health care provider. °· Follow your health care provider's suggestions for further testing if your chest pain does not go away. °· Keep any follow-up appointments you made. If you do not go to an appointment, you could develop lasting (chronic) problems with pain. If there is any problem keeping an appointment, call to reschedule. °SEEK MEDICAL CARE IF:  °· Your chest pain does not go away, even after treatment. °· You have a rash with blisters on your chest. °· You have a fever. °SEEK IMMEDIATE MEDICAL CARE IF:  °· You have increased chest pain or pain that spreads to your arm, neck, jaw, back, or abdomen. °· You have shortness of breath. °· You have an increasing cough, or you cough   up blood. °· You have severe back or abdominal pain. °· You feel nauseous or vomit. °· You have severe weakness. °· You faint. °· You have chills. °This is an emergency. Do not wait to see if the pain will go away. Get medical help at once. Call your local emergency services (911 in [place] Do not drive  yourself to the hospital. °MAKE SURE YOU:  °· Understand these instructions. °· Will watch your condition. °· Will get help right away if you are not doing well or get worse. °Document Released: 08/18/2005 Document Revised: 11/13/2013 Document Reviewed: 06/13/2008 °ExitCare® Patient Information ©2015 [employer]. This information is not intended to replace advice given to you by your health care provider. Make sure you discuss any questions you have with your health care provider. °Generalized Anxiety Disorder °Generalized anxiety disorder (GAD) is a mental disorder. It interferes with life functions, including relationships, work, and school. °GAD is different from normal anxiety, which everyone experiences at some point in their lives in response to specific life events and activities. Normal anxiety actually helps us prepare for and get through these life events and activities. Normal anxiety goes away after the event or activity is over.  °GAD causes anxiety that is not necessarily related to specific events or activities. It also causes excess anxiety in proportion to specific events or activities. The anxiety associated with GAD is also difficult to control. GAD can vary from mild to severe. People with severe GAD can have intense waves of anxiety with physical symptoms (panic attacks).  °SYMPTOMS °The anxiety and worry associated with GAD are difficult to control. This anxiety and worry are related to many life events and activities and also occur more days than not for 6 months or longer. People with GAD also have three or more of the following symptoms (one or more in children): °· Restlessness.   °· Fatigue. °· Difficulty concentrating.   °· Irritability. °· Muscle tension. °· Difficulty sleeping or unsatisfying sleep. °DIAGNOSIS °GAD is diagnosed through an assessment by your health care provider. Your health care provider will ask you questions about your mood, physical symptoms, and events in your  life. Your health care provider may ask you about your medical history and use of alcohol or drugs, including prescription medicines. Your health care provider may also do a physical exam and blood tests. Certain medical conditions and the use of certain substances can cause symptoms similar to those associated with GAD. Your health care provider may refer you to a mental health specialist for further evaluation. °TREATMENT °The following therapies are usually used to treat GAD:  °· Medication. Antidepressant medication usually is prescribed for long-term daily control. Antianxiety medicines may be added in severe cases, especially when panic attacks occur.   °· Talk therapy (psychotherapy). Certain types of talk therapy can be helpful in treating GAD by providing support, education, and guidance. A form of talk therapy called cognitive behavioral therapy can teach you healthy ways to think about and react to daily life events and activities. °· Stress management techniques. These include yoga, meditation, and exercise and can be very helpful when they are practiced regularly. °A mental health specialist can help determine which treatment is best for you. Some people see improvement with one therapy. However, other people require a combination of therapies. °Document Released: 03/05/2013 Document Revised: 03/25/2014 Document Reviewed: 03/05/2013 °ExitCare® Patient Information ©2015 [employer]. This information is not intended to replace advice given to you by your health care provider. Make sure you discuss any questions you   have with your health care provider. ° °

## 2014-10-02 NOTE — ED Notes (Signed)
Pt taking deep breath w/ frustration, asking "am I going to be here all night", "I have to go to work in the morning", pt instructed the doctor will be in shortly to answer those questions, when asking pt questions she is very short answering, pt stated she has chest pain.

## 2014-10-02 NOTE — ED Provider Notes (Signed)
CSN: [id number]     Arrival date & time 10/01/14  2330 History   First MD Initiated Contact with Patient 10/02/14 0151     Chief Complaint  Patient presents with  . Chest Pain     (Consider location/radiation/quality/duration/timing/severity/associated sxs/prior Treatment) Patient is a 43 y.o. female presenting with chest pain. The history is provided by the patient. No language interpreter was used.  Chest Pain Pain location:  L chest Pain quality: aching and tightness   Pain radiates to:  L shoulder Pain radiates to the back: no   Pain severity:  Mild Onset quality:  Gradual Timing:  Intermittent Associated symptoms: shortness of breath   Associated symptoms: no abdominal pain, no back pain, no cough, no fever, no nausea and not vomiting   Associated symptoms comment:  Chest pressure, SOB without cough, fever for the past 2 weeks, worsening over time, now radiating into left shoulder and UE. No vomiting. She reports being referred to cardiology in the past with negative evaluations. She states she is under a significant amount of stress and symptoms feel similar to anxiety/panic. She has also had a history of reflux, formerly on Nexium.    Past Medical History  Diagnosis Date  . GERD (gastroesophageal reflux disease) 2000    come and go  . Sleep apnea 2012  . Anxiety 2012  . Panic attack 2012    situational related to move from [place].   [name] Hypertension 2008  . Bilateral anterior knee pain 2012  . Anemia 12/22/2012   Past Surgical History  Procedure Laterality Date  . Partial hysterectomy  2009  . Laproscopic knee surgery  2011    L knee for bilateral meniscal tear. Following acute injury.   . Abdominal hysterectomy    . Cesarean section  2002   Family History  Problem Relation Age of Onset  . Diabetes Mother   . Hypertension Mother   . Hypertension Father   . Heart disease Mother 16  . Stroke Mother 40    x 2  . Alcohol abuse Mother     previous   . Drug  abuse Mother     previous cocaine    History  Substance Use Topics  . Smoking status: Never Smoker   . Smokeless tobacco: Never Used  . Alcohol Use: Yes     Comment: occassional, social    OB History    Gravida Para Term Preterm AB TAB SAB Ectopic Multiple Living   4 3 3  1 1    3      Review of Systems  Constitutional: Negative for fever and chills.  HENT: Negative.   Respiratory: Positive for chest tightness and shortness of breath. Negative for cough.   Cardiovascular: Positive for chest pain. Negative for leg swelling.  Gastrointestinal: Negative.  Negative for nausea, vomiting and abdominal pain.  Musculoskeletal: Negative.  Negative for back pain and neck stiffness.  Skin: Negative.   Neurological: Negative.       Allergies  Penicillins  Home Medications   Prior to Admission medications   Medication Sig Start Date End Date Taking? Authorizing Provider  aspirin 325 MG tablet Take 325 mg by mouth once.   Yes Historical Provider, MD  HYDROCHLOROTHIAZIDE PO Take 1 tablet by mouth daily.   Yes Historical Provider, MD  acetaminophen (TYLENOL) 325 MG tablet Take 2 tablets (650 mg total) by mouth every 6 (six) hours as needed for moderate pain. Patient not taking: Reported on 10/02/2014 06/06/14   [place]  Stoney Bang, MD  Calcium Citrate 200 MG TABS Take 2 tablets (400 mg total) by mouth daily. Patient not taking: Reported on 10/02/2014 08/24/13   Minerva Ends, MD  INDAPAMIDE PO Take by mouth.    Historical Provider, MD  metFORMIN (GLUCOPHAGE) 500 MG tablet Take 1 tablet (500 mg total) by mouth 2 (two) times daily with a meal. Patient not taking: Reported on 10/02/2014 04/19/14   Minerva Ends, MD  Vitamin D, Ergocalciferol, (DRISDOL) 50000 UNITS CAPS capsule Take 1 capsule (50,000 Units total) by mouth every 7 (seven) days. Patient not taking: Reported on 10/02/2014 08/24/13   Lennox Laity Funches, MD   BP 113/71 mmHg  Pulse 81  Temp(Src) 98.1 F (36.7 C) (Oral)   Resp 16  Ht 5\' 6"  (1.676 m)  Wt 272 lb (123.378 kg)  BMI 43.92 kg/m2  SpO2 99% Physical Exam  Constitutional: She is oriented to person, place, and time. She appears well-developed and well-nourished.  HENT:  Head: Normocephalic.  Neck: Normal range of motion. Neck supple.  Cardiovascular: Normal rate and regular rhythm.   No murmur heard. Pulmonary/Chest: Effort normal and breath sounds normal.  Abdominal: Soft. Bowel sounds are normal. There is no tenderness. There is no rebound and no guarding.  Musculoskeletal: Normal range of motion.  Neurological: She is alert and oriented to person, place, and time.  Skin: Skin is warm and dry. No rash noted.  Psychiatric: She has a normal mood and affect.    ED Course  Procedures (including critical care time) Labs Review Labs Reviewed  BASIC METABOLIC PANEL - Abnormal; Notable for the following:    Glucose, Bld 204 (*)    GFR calc non Af Amer 80 (*)    All other components within normal limits  CBC  PRO B NATRIURETIC PEPTIDE  I-STAT TROPOININ, ED    Imaging Review Dg Chest 2 View  10/02/2014   CLINICAL DATA:  Intermittent upper chest pain for several days. Shortness of breath.  EXAM: CHEST  2 VIEW  COMPARISON:  PA and lateral chest 11/13/2013.  FINDINGS: Heart size and mediastinal contours are within normal limits. Both lungs are clear. Visualized skeletal structures are unremarkable.  IMPRESSION: Negative exam.   Electronically Signed   By: Inge Rise M.D.   On: 10/02/2014 02:21     EKG Interpretation   Date/Time:  Tuesday October 01 2014 23:41:30 EST Ventricular Rate:  77 PR Interval:  174 QRS Duration: 81 QT Interval:  381 QTC Calculation: 431 R Axis:   62 Text Interpretation:  Sinus rhythm Nonspecific T abnrm, anterolateral  leads No significant change since last tracing Confirmed by OTTER  MD,  OLGA ([id number]) on 10/02/2014 12:58:44 AM      MDM   Final diagnoses:  Chest pain    DDx: anxiety vs cardiogenic  vs reflux. Maalox given in ED with some relief. EKG nonacute, lab studies reassuring. Doubt ACS given atypical symptoms and negative previous cardiac evaluations. Suspect patient's symptoms are in large part due to anxiety given significant current stressors. Will Rx Ativan, recommend Prilosec daily and close PCP follow up.    Dewaine Oats, PA-C 10/02/14 [place], MD 10/02/14 [phone]

## 2014-10-10 ENCOUNTER — Telehealth (INDEPENDENT_AMBULATORY_CARE_PROVIDER_SITE_OTHER): Payer: Self-pay

## 2014-10-10 DIAGNOSIS — E559 Vitamin D deficiency, unspecified: Secondary | ICD-10-CM

## 2014-10-10 DIAGNOSIS — K219 Gastro-esophageal reflux disease without esophagitis: Secondary | ICD-10-CM

## 2014-10-10 DIAGNOSIS — E1110 Type 2 diabetes mellitus with ketoacidosis without coma: Secondary | ICD-10-CM

## 2014-10-10 NOTE — Telephone Encounter (Signed)
Pt seen in office today by Dr Redmond Pulling and orders placed in epic.

## 2014-10-25 ENCOUNTER — Other Ambulatory Visit (INDEPENDENT_AMBULATORY_CARE_PROVIDER_SITE_OTHER): Payer: Self-pay

## 2014-10-25 DIAGNOSIS — A048 Other specified bacterial intestinal infections: Secondary | ICD-10-CM

## 2014-10-29 ENCOUNTER — Other Ambulatory Visit (HOSPITAL_COMMUNITY): Payer: 59

## 2014-10-29 ENCOUNTER — Ambulatory Visit (HOSPITAL_COMMUNITY): Payer: 59

## 2014-11-04 ENCOUNTER — Other Ambulatory Visit: Payer: Self-pay | Admitting: Family Medicine

## 2014-11-07 ENCOUNTER — Emergency Department (HOSPITAL_COMMUNITY): Payer: 59

## 2014-11-07 ENCOUNTER — Inpatient Hospital Stay (HOSPITAL_COMMUNITY)
Admission: EM | Admit: 2014-11-07 | Discharge: 2014-11-08 | DRG: 069 | Disposition: A | Discharge status: Home / Self Care | Payer: 59 | Attending: Family Medicine | Admitting: Family Medicine

## 2014-11-07 ENCOUNTER — Encounter (HOSPITAL_COMMUNITY): Payer: Self-pay | Admitting: Cardiology

## 2014-11-07 DIAGNOSIS — Z880 Allergy status to penicillin: Secondary | ICD-10-CM | POA: Diagnosis not present

## 2014-11-07 DIAGNOSIS — G4733 Obstructive sleep apnea (adult) (pediatric): Secondary | ICD-10-CM | POA: Diagnosis present

## 2014-11-07 DIAGNOSIS — E780 Pure hypercholesterolemia: Secondary | ICD-10-CM | POA: Diagnosis present

## 2014-11-07 DIAGNOSIS — R0789 Other chest pain: Secondary | ICD-10-CM

## 2014-11-07 DIAGNOSIS — G459 Transient cerebral ischemic attack, unspecified: Principal | ICD-10-CM | POA: Diagnosis present

## 2014-11-07 DIAGNOSIS — E119 Type 2 diabetes mellitus without complications: Secondary | ICD-10-CM

## 2014-11-07 DIAGNOSIS — K219 Gastro-esophageal reflux disease without esophagitis: Secondary | ICD-10-CM | POA: Diagnosis present

## 2014-11-07 DIAGNOSIS — R4781 Slurred speech: Secondary | ICD-10-CM | POA: Diagnosis not present

## 2014-11-07 DIAGNOSIS — Z90710 Acquired absence of both cervix and uterus: Secondary | ICD-10-CM | POA: Diagnosis not present

## 2014-11-07 DIAGNOSIS — F419 Anxiety disorder, unspecified: Secondary | ICD-10-CM | POA: Diagnosis present

## 2014-11-07 DIAGNOSIS — Z6841 Body Mass Index (BMI) 40.0 and over, adult: Secondary | ICD-10-CM

## 2014-11-07 DIAGNOSIS — M940 Chondrocostal junction syndrome [Tietze]: Secondary | ICD-10-CM | POA: Diagnosis present

## 2014-11-07 DIAGNOSIS — Z7982 Long term (current) use of aspirin: Secondary | ICD-10-CM

## 2014-11-07 DIAGNOSIS — I10 Essential (primary) hypertension: Secondary | ICD-10-CM | POA: Diagnosis present

## 2014-11-07 DIAGNOSIS — R200 Anesthesia of skin: Secondary | ICD-10-CM | POA: Diagnosis present

## 2014-11-07 DIAGNOSIS — R079 Chest pain, unspecified: Secondary | ICD-10-CM

## 2014-11-07 DIAGNOSIS — I209 Angina pectoris, unspecified: Secondary | ICD-10-CM

## 2014-11-07 DIAGNOSIS — G514 Facial myokymia: Secondary | ICD-10-CM

## 2014-11-07 DIAGNOSIS — F410 Panic disorder [episodic paroxysmal anxiety] without agoraphobia: Secondary | ICD-10-CM | POA: Diagnosis present

## 2014-11-07 LAB — CBC
HCT: 41.7 % (ref 36.0–46.0)
Hemoglobin: 13.8 g/dL (ref 12.0–15.0)
MCH: 27.3 pg (ref 26.0–34.0)
MCHC: 33.1 g/dL (ref 30.0–36.0)
MCV: 82.6 fL (ref 78.0–100.0)
PLATELETS: 254 10*3/uL (ref 150–400)
RBC: 5.05 MIL/uL (ref 3.87–5.11)
RDW: 12.6 % (ref 11.5–15.5)
WBC: 10.1 10*3/uL (ref 4.0–10.5)

## 2014-11-07 LAB — BASIC METABOLIC PANEL
ANION GAP: 11 (ref 5–15)
BUN: 22 mg/dL (ref 6–23)
CO2: 27 meq/L (ref 19–32)
CREATININE: 0.78 mg/dL (ref 0.50–1.10)
Calcium: 9.3 mg/dL (ref 8.4–10.5)
Chloride: 100 mEq/L (ref 96–112)
GFR calc Af Amer: >90 mL/min (ref >90)
GFR calc non Af Amer: >90 mL/min (ref >90)
Glucose, Bld: 274 mg/dL — ABNORMAL HIGH (ref 70–99)
Potassium: 3.8 mEq/L (ref 3.7–5.3)
Sodium: 138 mEq/L (ref 137–147)

## 2014-11-07 LAB — I-STAT TROPONIN, ED: Troponin i, poc: 0 ng/mL (ref 0.00–0.08)

## 2014-11-07 NOTE — ED Notes (Signed)
Pt reports chest pain x1 week. Reports that she has also had numbness in the left fingers and hand. Today had pain that radiated up into her jaw. Pt reports some nausea. States she had a stress test before, and was negative.

## 2014-11-07 NOTE — ED Notes (Signed)
Attempted report 

## 2014-11-07 NOTE — H&P (Signed)
[facility] Admission History and Physical Service Pager: [phone]  Patient name: Carolyn Holland Medical record number: [id number] Date of birth: 1970/12/29 Age: 43 y.o. Gender: female  Primary Care Provider: Glendon Axe, MD Consultants: none Code Status: full  Chief Complaint: facial twitching, slurred speech, and tongue numbness  Assessment and Plan: Carolyn Holland is a 43 y.o. female with a history of HTN, DM II, and anxiety presenting with facial twitching, slurred speech, tongue numbness, and chest pain.  Facial twitching, slurred speech, tongue numbness: concern is for TIA given constellation of symptoms and quick resolution, though given distribution of symptoms symptoms to multiple parts of the face it would be odd for this to have a CNS origin. Could potentially have been related to her anxiety/panic attacks, though it is prudent to work her up for TIA given her risk factors. No neurological deficits on exam. CT head with no acute findings. ABCD2 score of 2 placing at a low 2 day stroke risk. -monitor on tele -continue home ASA 325 mg - will need to consider plavix if work-up is positive -MRI/MRA head -carotid dopplers and echo -lipid panel, A1c -neuro checks q2 hours -consider PT/OT in am if develops symptoms -will monitor BPs - has been normotensive -if there is a component of anxiety in this presentation, will plan on starting SSRI while hospitalized  Chest pain: atypical in nature with poking sensation, short duration, non-exertional, no dyspnea or radiation. EKG with no ischemic changes. Negative istat troponin. CXR with no acute findings. Likely to be a costochondritis or other MSK cause. Has risk factors for cardiac disease, though reported negative stress test recently makes this less likely. Unlikely to be PNA, PE, or pneumothorax given stable vital signs and normal CXR. Stress echo noted from 02/2012 that was negative for ischemia. -cycle  troponins -continue ASA for pain -EKG in am -consider ibuprofen if above work up is negative  Type 2 DM: last A1c 7.6 06/06/14. On metformin at home. -check A1c here -SSI while hospitalized -consider alternate agent as patient does not like metformin  HTN: normotensive at time of admission. On home HCTZ. -will monitor BP at this time -hold HCTZ  OSA: patient reports not using a CPAP at home. Last sleep study 11/08/14 with mild OSA and did not have enough events to qualify for CPAP. -monitor -consider outpatient sleep eval if this is an issue in the future - can f/u with PCP for this  FEN/GI: carb modified diet Prophylaxis: SQ heparin  Disposition: admit to tele, attending Dr Andria Frames, discharge pending completion of the above work-up  History of Present Illness: Carolyn Holland is a 43 y.o. female with a history of HTN, DM II, and anxiety presenting with facial twitching, slurred speech, tongue numbness, and chest pain.  Patient notes episode of sensation of pulling on the right side of face this afternoon while at work. She noted the left side of her face started to twitch after this. Then noted the tip of her tongue was numb. One of her co-workers told her that her speech was slurred at that time as well. She notes this lasted for a few minutes. She has had no recurrence of these symptoms. She had no weakness or numbness of her arms or legs with this. She notes a similar episode of this 4 years ago in [place], [place] with tightness and pulling in her neck and jaw. She had a cardiac work-up at that time that she states revealed no abnormalities.  She endorses  left sided chest pain for the past 5 days. She states it feels as though she is being poked by someone;s finger and it is also burning. States this is intermittent. Lasts a few seconds. Nothing brings this on or makes it worse. She does note episodes of diaphoresis with this. She did have nausea this morning, though not with chest pain. Is not  associated with exertion or dyspnea. She states this is different than her typical panic attacks. She notes aspirin takes the pain away. She states her new doctor at [facility] recently sent her to see cardiology due to something looking slightly abnormal on her EKG. She states she had a normal stress test in August. She notes her previous stress test and echo in [place], [place] were normal. Her mother has a history of MI and CVA in her 31's associated with cocaine use. Patient denies history of MI or CVA. Denies use of illicit drugs and cigarettes. She notes metformin gives her headaches.   In the ED the patient had a negative CT head and CXR. Istat troponin negative as well.   Review Of Systems: Per HPI with the following additions: none Otherwise 12 point review of systems was performed and was unremarkable.  Patient Active Problem List   Diagnosis Date Noted  . TIA (transient ischemic attack) 11/07/2014  . Lightheadedness 06/07/2014  . Hot flashes 12/14/2013  . ASCUS with positive high risk HPV 12/14/2013  . Dark urine 12/14/2013  . Venous insufficiency 12/11/2013  . Bilateral leg edema 11/02/2013  . Acid reflux 10/04/2013  . Morbid obesity 08/23/2013  . Persistent headaches 08/23/2013  . Atypical chest pain 06/15/2013  . Bursitis of right shoulder 06/14/2013  . Obstructive sleep apnea 03/02/2013  . Right hip pain 01/29/2013  . Diabetes mellitus, type 2 12/29/2012  . Neck pain, bilateral posterior 12/24/2012  . Vitamin D deficiency 12/22/2012  . Anxiety   . Hypertension   . Bilateral anterior knee pain    Past Medical History: Past Medical History  Diagnosis Date  . GERD (gastroesophageal reflux disease) 2000    come and go  . Sleep apnea 2012  . Anxiety 2012  . Panic attack 2012    situational related to move from [place].   [name] Hypertension 2008  . Bilateral anterior knee pain 2012  . Anemia 12/22/2012   Past Surgical History: Past Surgical History  Procedure  Laterality Date  . Partial hysterectomy  2009  . Laproscopic knee surgery  2011    L knee for bilateral meniscal tear. Following acute injury.   . Abdominal hysterectomy    . Cesarean section  2002   Social History: History  Substance Use Topics  . Smoking status: Never Smoker   . Smokeless tobacco: Never Used  . Alcohol Use: Yes     Comment: occassional, social    Additional social history: none  Please also refer to relevant sections of EMR.  Family History: Family History  Problem Relation Age of Onset  . Diabetes Mother   . Hypertension Mother   . Hypertension Father   . Heart disease Mother 42  . Stroke Mother 40    x 2  . Alcohol abuse Mother     previous   . Drug abuse Mother     previous cocaine    Allergies and Medications: Allergies  Allergen Reactions  . Penicillins Hives   No current facility-administered medications on file prior to encounter.   Current Outpatient Prescriptions on File Prior to Encounter  Medication Sig Dispense Refill  . aspirin 325 MG tablet Take 325 mg by mouth once.    [name] HYDROCHLOROTHIAZIDE PO Take 12.5 mg by mouth daily.     . metFORMIN (GLUCOPHAGE) 500 MG tablet Take 1 tablet (500 mg total) by mouth 2 (two) times daily with a meal. 60 tablet 2  . Vitamin D, Ergocalciferol, (DRISDOL) 50000 UNITS CAPS capsule Take 1 capsule (50,000 Units total) by mouth every 7 (seven) days. 30 capsule 0    Objective: BP 139/83 mmHg  Pulse 82  Temp(Src) 98 F (36.7 C) (Oral)  Resp 23  Wt 272 lb (123.378 kg)  SpO2 99% Exam: General: NAD, laying comfortably during the exam, intermittently anxious affect HEENT: NCAT, MMM, PERRL Cardiovascular: rrr, no murmurs Chest wall: there is tenderness of the left costochondral joints Respiratory: CTAB, no wheezes or crackles Abdomen: s, NT, ND Extremities: no edema Skin: no lesions Neuro: CN 2-12 intact, 5/5 strength in bilateral biceps, triceps, grip, quads, hamstrings, plantar and dorsiflexion,  sensation to light touch intact in bilateral UE and LE, normal finger to nose, normal rapid alternating movement of hands, 2+ patellar reflexes  Labs and Imaging: CBC BMET   Recent Labs Lab 11/07/14 1816  WBC 10.1  HGB 13.8  HCT 41.7  PLT 254    Recent Labs Lab 11/07/14 1816  NA 138  K 3.8  CL 100  CO2 27  BUN 22  CREATININE 0.78  GLUCOSE 274*  CALCIUM 9.3     Neg istat troponin EKG NSR rate 81 no ST or T wave changes  Dg Chest 2 View  11/07/2014   CLINICAL DATA:  Chest pain for 5 days. Hypertension and sleep apnea.  EXAM: CHEST  2 VIEW  COMPARISON:  None.  FINDINGS: The heart size and mediastinal contours are within normal limits. Both lungs are clear. The visualized skeletal structures are unremarkable.  IMPRESSION: No active cardiopulmonary disease.   Electronically Signed   By: Kerby Moors M.D.   On: 11/07/2014 20:49   Ct Head Wo Contrast  11/07/2014   CLINICAL DATA:  Slurred speech. Numbness in the tongue and RIGHT hand. Initial encounter.  EXAM: CT HEAD WITHOUT CONTRAST  TECHNIQUE: Contiguous axial images were obtained from the base of the skull through the vertex without intravenous contrast.  COMPARISON:  None.  FINDINGS: No mass lesion, mass effect, midline shift, hydrocephalus, hemorrhage. No territorial ischemia or acute infarction. Visible paranasal sinuses appear normal.  IMPRESSION: Negative CT head.   Electronically Signed   By: Dereck Ligas M.D.   On: 11/07/2014 21:08     Leone Haven, MD 11/07/2014, 11:04 PM PGY-3, [place] [facility] pager: [phone], text pages welcome

## 2014-11-07 NOTE — ED Notes (Addendum)
Pt reports chest pain for five days that comes and goes and is relieved with Asprin. Today new onset nausea and tingling to fingers, as well as tightness to neck. Pt took two adult bayer asprin this morning. Pt reports a history of anxiety and reports chills and hot, reports possibly menopause pt and also reports forgetting things for a while.

## 2014-11-07 NOTE — ED Provider Notes (Signed)
CSN: [id number]     Arrival date & time 11/07/14  1758 History   First MD Initiated Contact with Patient 11/07/14 1942     Chief Complaint  Patient presents with  . Chest Pain     (Consider location/radiation/quality/duration/timing/severity/associated sxs/prior Treatment) HPI Comments: 43 year old female presenting with the chief complaint of an episode of slurred speech, tongue numbness, and facial tightening. She states that she was at her desk when the symptoms began. She started feeling poorly, noticed her tongue was tingling and numb, and then felt her face drawing. A coworker noted that her speech was slurred and recommended she come to the emergency department for further evaluation.  Her symptoms subsequently resolved. She had no arm or leg weakness during this time.  She also notes that for the past week or so she has had left-sided chest pain each morning, which resolves with aspirin. She also notes that in the mornings, her fingers feel tingly when she wakes up. This symptom goes away after she gets out of bed. She has had some associated nausea, unclear if it's associated with her chest pain. She denies diaphoresis or shortness of breath. Her chest pain is in no way exertional. She has tried Nexium without relief.     Past Medical History  Diagnosis Date  . GERD (gastroesophageal reflux disease) 2000    come and go  . Sleep apnea 2012  . Anxiety 2012  . Panic attack 2012    situational related to move from [place].   [name] Hypertension 2008  . Bilateral anterior knee pain 2012  . Anemia 12/22/2012   Past Surgical History  Procedure Laterality Date  . Partial hysterectomy  2009  . Laproscopic knee surgery  2011    L knee for bilateral meniscal tear. Following acute injury.   . Abdominal hysterectomy    . Cesarean section  2002   Family History  Problem Relation Age of Onset  . Diabetes Mother   . Hypertension Mother   . Hypertension Father   . Heart disease Mother  19  . Stroke Mother 40    x 2  . Alcohol abuse Mother     previous   . Drug abuse Mother     previous cocaine    History  Substance Use Topics  . Smoking status: Never Smoker   . Smokeless tobacco: Never Used  . Alcohol Use: Yes     Comment: occassional, social    OB History    Gravida Para Term Preterm AB TAB SAB Ectopic Multiple Living   4 3 3  1 1    3      Review of Systems  All other systems reviewed and are negative.     Allergies  Penicillins  Home Medications   Prior to Admission medications   Medication Sig Start Date End Date Taking? Authorizing Provider  acetaminophen (TYLENOL) 325 MG tablet Take 2 tablets (650 mg total) by mouth every 6 (six) hours as needed for moderate pain. Patient not taking: Reported on 10/02/2014 06/06/14   Leeanne Rio, MD  aspirin 325 MG tablet Take 325 mg by mouth once.    Historical Provider, MD  Calcium Citrate 200 MG TABS Take 2 tablets (400 mg total) by mouth daily. Patient not taking: Reported on 10/02/2014 08/24/13   Lennox Laity Funches, MD  HYDROCHLOROTHIAZIDE PO Take 1 tablet by mouth daily.    Historical Provider, MD  INDAPAMIDE PO Take by mouth.    Historical Provider, MD  LORazepam (  ATIVAN) 1 MG tablet Take 1 tablet (1 mg total) by mouth 3 (three) times daily as needed for anxiety. 10/02/14   Shari A Upstill, PA-C  metFORMIN (GLUCOPHAGE) 500 MG tablet Take 1 tablet (500 mg total) by mouth 2 (two) times daily with a meal. Patient not taking: Reported on 10/02/2014 04/19/14   Lennox Laity Funches, MD  omeprazole (PRILOSEC) 20 MG capsule Take 1 capsule (20 mg total) by mouth daily. 10/02/14   Shari A Upstill, PA-C  Vitamin D, Ergocalciferol, (DRISDOL) 50000 UNITS CAPS capsule Take 1 capsule (50,000 Units total) by mouth every 7 (seven) days. Patient not taking: Reported on 10/02/2014 08/24/13   Josalyn C Funches, MD   BP 137/91 mmHg  Pulse 77  Temp(Src) 98 F (36.7 C) (Oral)  Resp 17  Wt 272 lb (123.378 kg)  SpO2  98% Physical Exam  Constitutional: She is oriented to person, place, and time. She appears well-developed and well-nourished. No distress.  Obese  HENT:  Head: Normocephalic and atraumatic.  Mouth/Throat: Oropharynx is clear and moist.  Eyes: Conjunctivae are normal. Pupils are equal, round, and reactive to light. No scleral icterus.  Neck: Neck supple.  Cardiovascular: Normal rate, regular rhythm, normal heart sounds and intact distal pulses.   No murmur heard. Pulmonary/Chest: Effort normal and breath sounds normal. No stridor. No respiratory distress. She has no rales.  Abdominal: Soft. Bowel sounds are normal. She exhibits no distension. There is no tenderness.  Musculoskeletal: Normal range of motion.  Neurological: She is alert and oriented to person, place, and time. She has normal strength. No cranial nerve deficit or sensory deficit. GCS eye subscore is 4. GCS verbal subscore is 5. GCS motor subscore is 6.  Skin: Skin is warm and dry. No rash noted.  Psychiatric: She has a normal mood and affect. Her behavior is normal.  Nursing note and vitals reviewed.   ED Course  Procedures (including critical care time) Labs Review Labs Reviewed  BASIC METABOLIC PANEL - Abnormal; Notable for the following:    Glucose, Bld 274 (*)    All other components within normal limits  CBC  I-STAT TROPOININ, ED    Imaging Review Dg Chest 2 View  11/07/2014   CLINICAL DATA:  Chest pain for 5 days. Hypertension and sleep apnea.  EXAM: CHEST  2 VIEW  COMPARISON:  None.  FINDINGS: The heart size and mediastinal contours are within normal limits. Both lungs are clear. The visualized skeletal structures are unremarkable.  IMPRESSION: No active cardiopulmonary disease.   Electronically Signed   By: Kerby Moors M.D.   On: 11/07/2014 20:49   Ct Head Wo Contrast  11/07/2014   CLINICAL DATA:  Slurred speech. Numbness in the tongue and RIGHT hand. Initial encounter.  EXAM: CT HEAD WITHOUT CONTRAST   TECHNIQUE: Contiguous axial images were obtained from the base of the skull through the vertex without intravenous contrast.  COMPARISON:  None.  FINDINGS: No mass lesion, mass effect, midline shift, hydrocephalus, hemorrhage. No territorial ischemia or acute infarction. Visible paranasal sinuses appear normal.  IMPRESSION: Negative CT head.   Electronically Signed   By: Dereck Ligas M.D.   On: 11/07/2014 21:08  All radiology studies independently viewed by me.       EKG Interpretation   Date/Time:  Thursday November 07 2014 18:07:28 EST Ventricular Rate:  81 PR Interval:  156 QRS Duration: 92 QT Interval:  366 QTC Calculation: 425 R Axis:   54 Text Interpretation:  Normal sinus rhythm Normal  ECG compared to prior,  nonspecific t wave changes resolved Confirmed by [facility]  MD, TREY ([id number])  on 11/07/2014 8:15:10 PM      MDM   Final diagnoses:  Slurred speech    43 year old female presenting with chief complaint of a short episode of facial drawing up, tongue numbness, and slurred speech.  She also complained of chest pain which has been present for about a week off and on. She stated that her coworker noted that her speech was slurred. In light of her other symptoms, this is concerning for possible TIA. Although she is young, she has family history of stroke as well as hypertension and diabetes. For this reason, I feel that she needs to be admitted for further workup.  As regards her chest pain, feel this is unlikely represent ACS.  Her EKG is normal and her troponin is negative and she has not had any chest pain since this morning.  Admitted for further workup.   Artis Delay, MD 11/08/14 [phone]

## 2014-11-08 ENCOUNTER — Inpatient Hospital Stay (HOSPITAL_COMMUNITY): Payer: 59

## 2014-11-08 DIAGNOSIS — R200 Anesthesia of skin: Secondary | ICD-10-CM | POA: Insufficient documentation

## 2014-11-08 DIAGNOSIS — I10 Essential (primary) hypertension: Secondary | ICD-10-CM

## 2014-11-08 DIAGNOSIS — G459 Transient cerebral ischemic attack, unspecified: Secondary | ICD-10-CM

## 2014-11-08 DIAGNOSIS — R4781 Slurred speech: Secondary | ICD-10-CM | POA: Insufficient documentation

## 2014-11-08 DIAGNOSIS — I369 Nonrheumatic tricuspid valve disorder, unspecified: Secondary | ICD-10-CM

## 2014-11-08 DIAGNOSIS — R079 Chest pain, unspecified: Secondary | ICD-10-CM | POA: Insufficient documentation

## 2014-11-08 DIAGNOSIS — G514 Facial myokymia: Secondary | ICD-10-CM | POA: Insufficient documentation

## 2014-11-08 DIAGNOSIS — F419 Anxiety disorder, unspecified: Secondary | ICD-10-CM

## 2014-11-08 LAB — CREATININE, SERUM
Creatinine, Ser: 0.79 mg/dL (ref 0.50–1.10)
GFR calc Af Amer: >90 mL/min (ref >90)
GFR calc non Af Amer: >90 mL/min (ref >90)

## 2014-11-08 LAB — CBC
HCT: 40.5 % (ref 36.0–46.0)
HEMOGLOBIN: 13.6 g/dL (ref 12.0–15.0)
MCH: 28.3 pg (ref 26.0–34.0)
MCHC: 33.6 g/dL (ref 30.0–36.0)
MCV: 84.2 fL (ref 78.0–100.0)
Platelets: 221 10*3/uL (ref 150–400)
RBC: 4.81 MIL/uL (ref 3.87–5.11)
RDW: 12.6 % (ref 11.5–15.5)
WBC: 8.2 10*3/uL (ref 4.0–10.5)

## 2014-11-08 LAB — RAPID URINE DRUG SCREEN, HOSP PERFORMED
Amphetamines: NOT DETECTED
Barbiturates: NOT DETECTED
Benzodiazepines: NOT DETECTED
COCAINE: NOT DETECTED
OPIATES: NOT DETECTED
TETRAHYDROCANNABINOL: NOT DETECTED

## 2014-11-08 LAB — TROPONIN I
Troponin I: <0.3 ng/mL (ref <0.30)
Troponin I: <0.3 ng/mL (ref <0.30)

## 2014-11-08 LAB — LIPID PANEL
Cholesterol: 211 mg/dL — ABNORMAL HIGH (ref 0–200)
HDL: 71 mg/dL (ref >39)
LDL CALC: 123 mg/dL — AB (ref 0–99)
Total CHOL/HDL Ratio: 3 RATIO
Triglycerides: 86 mg/dL (ref <150)
VLDL: 17 mg/dL (ref 0–40)

## 2014-11-08 LAB — GLUCOSE, CAPILLARY
GLUCOSE-CAPILLARY: 179 mg/dL — AB (ref 70–99)
GLUCOSE-CAPILLARY: 208 mg/dL — AB (ref 70–99)
GLUCOSE-CAPILLARY: 378 mg/dL — AB (ref 70–99)
Glucose-Capillary: 217 mg/dL — ABNORMAL HIGH (ref 70–99)

## 2014-11-08 LAB — TSH: TSH: 1.28 u[IU]/mL (ref 0.350–4.500)

## 2014-11-08 LAB — HEMOGLOBIN A1C
Hgb A1c MFr Bld: 7.8 % — ABNORMAL HIGH (ref <5.7)
MEAN PLASMA GLUCOSE: 177 mg/dL — AB (ref <117)

## 2014-11-08 MED ORDER — ATORVASTATIN CALCIUM 20 MG PO TABS: 20.0000 mg | ORAL_TABLET | Freq: Every day | ORAL | Status: DC

## 2014-11-08 MED ORDER — LORAZEPAM 2 MG/ML IJ SOLN
2.0000 mg | Freq: Once | INTRAMUSCULAR | Status: AC | PRN
  Administered 2014-11-08: 2 mg via INTRAVENOUS
  Filled 2014-11-08: qty 1

## 2014-11-08 MED ORDER — INSULIN ASPART 100 UNIT/ML ~~LOC~~ SOLN
0.0000 [IU] | Freq: Three times a day (TID) | SUBCUTANEOUS | Status: DC
  Administered 2014-11-08 (×2): 5 [IU] via SUBCUTANEOUS
  Administered 2014-11-08: 3 [IU] via SUBCUTANEOUS

## 2014-11-08 MED ORDER — SERTRALINE HCL 50 MG PO TABS: ORAL_TABLET | ORAL | Status: DC

## 2014-11-08 MED ORDER — HEPARIN SODIUM (PORCINE) 5000 UNIT/ML IJ SOLN
5000.0000 [IU] | Freq: Three times a day (TID) | INTRAMUSCULAR | Status: DC
  Administered 2014-11-08: 5000 [IU] via SUBCUTANEOUS
  Filled 2014-11-08: qty 1

## 2014-11-08 MED ORDER — ASPIRIN 325 MG PO TABS
325.0000 mg | ORAL_TABLET | Freq: Once | ORAL | Status: AC
  Administered 2014-11-08: 325 mg via ORAL
  Filled 2014-11-08: qty 1

## 2014-11-08 MED ORDER — SERTRALINE HCL 50 MG PO TABS: 25.0000 mg | ORAL_TABLET | Freq: Every day | ORAL | Status: DC

## 2014-11-08 MED ORDER — STROKE: EARLY STAGES OF RECOVERY BOOK
Freq: Once | Status: AC
  Administered 2014-11-08: 01:00:00
  Filled 2014-11-08: qty 1

## 2014-11-08 MED ORDER — INSULIN ASPART 100 UNIT/ML ~~LOC~~ SOLN
0.0000 [IU] | Freq: Every day | SUBCUTANEOUS | Status: DC
  Administered 2014-11-08: 5 [IU] via SUBCUTANEOUS

## 2014-11-08 MED ORDER — LORAZEPAM 0.5 MG PO TABS
0.5000 mg | ORAL_TABLET | Freq: Once | ORAL | Status: AC
  Administered 2014-11-08: 0.5 mg via ORAL
  Filled 2014-11-08: qty 1

## 2014-11-08 MED ORDER — ATORVASTATIN CALCIUM 20 MG PO TABS
20.0000 mg | ORAL_TABLET | Freq: Every day | ORAL | Status: DC
  Administered 2014-11-08: 20 mg via ORAL
  Filled 2014-11-08: qty 1

## 2014-11-08 NOTE — Progress Notes (Signed)
Echo Lab  2D Echocardiogram completed.  [place], RDCS 11/08/2014 10:27 AM

## 2014-11-08 NOTE — Discharge Instructions (Signed)
Carolyn Holland, we are so glad to see that you are feeling better.  You were admitted because we were concerned that you had a TIA vs heart abnormalities.  You had several studies done to evaluate you for any intracranial abnormalities.  There was no evidence of this on CT scan or MRI of your head.  You also had an echo of your heart which revealed NO abnormalities.  The ultrasound of you carotids (neck vessels) also showed NO abnormalities.  There is a high suspicion that your anxiety is playing a part in your symptoms.  For this reason, you were started on Zoloft (Sertraline).  You will start by taking 1/2 tablet for 7 days as we discussed.  After 7 days you may increase your dose to a full 50mg  tablet and continue taking that from then on.  Please make sure to follow up with your PCP.  Generalized Anxiety Disorder Generalized anxiety disorder (GAD) is a mental disorder. It interferes with life functions, including relationships, work, and school. GAD is different from normal anxiety, which everyone experiences at some point in their lives in response to specific life events and activities. Normal anxiety actually helps [place] prepare for and get through these life events and activities. Normal anxiety goes away after the event or activity is over.  GAD causes anxiety that is not necessarily related to specific events or activities. It also causes excess anxiety in proportion to specific events or activities. The anxiety associated with GAD is also difficult to control. GAD can vary from mild to severe. People with severe GAD can have intense waves of anxiety with physical symptoms (panic attacks).  SYMPTOMS The anxiety and worry associated with GAD are difficult to control. This anxiety and worry are related to many life events and activities and also occur more days than not for 6 months or longer. People with GAD also have three or more of the following symptoms (one or more in children):  Restlessness.    Fatigue.  Difficulty concentrating.   Irritability.  Muscle tension.  Difficulty sleeping or unsatisfying sleep. DIAGNOSIS GAD is diagnosed through an assessment by your health care provider. Your health care provider will ask you questions aboutyour mood,physical symptoms, and events in your life. Your health care provider may ask you about your medical history and use of alcohol or drugs, including prescription medicines. Your health care provider may also do a physical exam and blood tests. Certain medical conditions and the use of certain substances can cause symptoms similar to those associated with GAD. Your health care provider may refer you to a mental health specialist for further evaluation. TREATMENT The following therapies are usually used to treat GAD:   Medication. Antidepressant medication usually is prescribed for long-term daily control. Antianxiety medicines may be added in severe cases, especially when panic attacks occur.   Talk therapy (psychotherapy). Certain types of talk therapy can be helpful in treating GAD by providing support, education, and guidance. A form of talk therapy called cognitive behavioral therapy can teach you healthy ways to think about and react to daily life events and activities.  Stress managementtechniques. These include yoga, meditation, and exercise and can be very helpful when they are practiced regularly. A mental health specialist can help determine which treatment is best for you. Some people see improvement with one therapy. However, other people require a combination of therapies. Document Released: 03/05/2013 Document Revised: 03/25/2014 Document Reviewed: 03/05/2013 [facility] Patient Information 2015 [place], [place]. This information is not intended  to replace advice given to you by your health care provider. Make sure you discuss any questions you have with your health care provider. ° °

## 2014-11-08 NOTE — Progress Notes (Signed)
Utilization review completed. Lakeithia Rasor, RN, BSN. 

## 2014-11-08 NOTE — Consult Note (Signed)
CARDIOLOGY CONSULT NOTE   Patient ID: Carolyn Holland MRN: [id number], DOB/AGE: December 26, 1970   Admit date: 11/07/2014 Date of Consult: 11/08/2014   Primary Physician: Glendon Axe, MD Primary Cardiologist: None  Pt. Profile  43 year old African-American woman admitted with possible TIA and transient chest discomfort  Problem List  Past Medical History  Diagnosis Date  . GERD (gastroesophageal reflux disease) 2000    come and go  . Sleep apnea 2012  . Anxiety 2012  . Panic attack 2012    situational related to move from [place].   [name] Hypertension 2008  . Bilateral anterior knee pain 2012  . Anemia 12/22/2012    Past Surgical History  Procedure Laterality Date  . Partial hysterectomy  2009  . Laproscopic knee surgery  2011    L knee for bilateral meniscal tear. Following acute injury.   . Abdominal hysterectomy    . Cesarean section  2002     Allergies  Allergies  Allergen Reactions  . Penicillins Hives    HPI   This 43 year old woman with history of hypertension and recently discovered diabetes and a history of anxiety was admitted to the hospital last evening after suffering symptoms of possible TIA while at work.  She developed facial twitching and slurred CH and numbness of the tongue.  Earlier in the patient also had some left-sided chest discomfort.  She does not have any history of ischemic heart disease.  In 2013 she had a normal stress echocardiogram here.  Recently she had a normal stress test at [facility] in [facility] in August 2015.  She does not give any history of exertional chest pain.  Her initial troponins are normal 2.  Her EKG shows normal sinus rhythm and no evidence of ischemia.  She does have a past history of anxiety and panic attacks. Her family history reveals that her mother had 2 strokes and a heart attack in her 36s.  However at that time the mother was doing cocaine and alcohol.  The patient's mother has now been free of drugs  for many years and her health is good.  The patient's father does not have any history of ischemic heart disease  Inpatient Medications  . heparin  5,000 Units Subcutaneous 3 times per day  . insulin aspart  0-15 Units Subcutaneous TID WC  . insulin aspart  0-5 Units Subcutaneous QHS    Family History Family History  Problem Relation Age of Onset  . Diabetes Mother   . Hypertension Mother   . Hypertension Father   . Heart disease Mother 40  . Stroke Mother 40    x 2  . Alcohol abuse Mother     previous   . Drug abuse Mother     previous cocaine      Social History History   Social History  . Marital Status: Single    Spouse Name: N/A    Number of Children: 3  . Years of Education: assoc. deg   Occupational History  .  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  She states that it is not a high stress job     Social History Main Topics  . Smoking status: Never Smoker   . Smokeless tobacco: Never Used  . Alcohol Use: Yes     Comment: occassional, social   . Drug Use: No  . Sexual Activity:    Partners: Male    Birth Control/ Protection: Condom, Surgical   Other Topics Concern  .  Not on file   Social History Narrative   Live with two children 16 and 11.          Review of Systems  General:  No chills, fever, night sweats or weight changes.  Cardiovascular:  No chest pain, dyspnea on exertion, edema, orthopnea, palpitations, paroxysmal nocturnal dyspnea. Dermatological: No rash, lesions/masses Respiratory: No cough, dyspnea Urologic: No hematuria, dysuria Abdominal:   No nausea, vomiting, diarrhea, bright red blood per rectum, melena, or hematemesis Neurologic:  No visual changes, wkns, changes in mental status.  Recent tongue numbness and slurred speech and facial twitching. All other systems reviewed and are otherwise negative except as noted above.  Physical Exam  Blood pressure 134/90, pulse 74, temperature 98.3 F (36.8 C), temperature source  Oral, resp. rate 20, height 5\' 6"  (1.676 m), weight 270 lb 8 oz (122.698 kg), SpO2 97 %.  General: Pleasant, NAD Psych: Normal affect. Neuro: Alert and oriented X 3. Moves all extremities spontaneously. HEENT: Normal  Neck: Supple without bruits or JVD. Lungs:  Resp regular and unlabored, CTA. Heart: RRR no s3, s4, or murmurs. Abdomen: Soft, non-tender, non-distended, BS + x 4.  Extremities: No clubbing, cyanosis or edema. DP/PT/Radials 2+ and equal bilaterally.  Labs   Recent Labs  11/08/14 0118 11/08/14 0559  TROPONINI <0.30 <0.30   Lab Results  Component Value Date   WBC 8.2 11/08/2014   HGB 13.6 11/08/2014   HCT 40.5 11/08/2014   MCV 84.2 11/08/2014   PLT 221 11/08/2014     Recent Labs Lab 11/07/14 1816 11/08/14 0118  NA 138  --   K 3.8  --   CL 100  --   CO2 27  --   BUN 22  --   CREATININE 0.78 0.79  CALCIUM 9.3  --   GLUCOSE 274*  --    Lab Results  Component Value Date   CHOL 211* 11/08/2014   HDL 71 11/08/2014   LDLCALC 123* 11/08/2014   TRIG 86 11/08/2014   Lab Results  Component Value Date   DDIMER 0.38 11/13/2013    Radiology/Studies  Dg Chest 2 View  11/07/2014   CLINICAL DATA:  Chest pain for 5 days. Hypertension and sleep apnea.  EXAM: CHEST  2 VIEW  COMPARISON:  None.  FINDINGS: The heart size and mediastinal contours are within normal limits. Both lungs are clear. The visualized skeletal structures are unremarkable.  IMPRESSION: No active cardiopulmonary disease.   Electronically Signed   By: Kerby Moors M.D.   On: 11/07/2014 20:49   Ct Head Wo Contrast  11/07/2014   CLINICAL DATA:  Slurred speech. Numbness in the tongue and RIGHT hand. Initial encounter.  EXAM: CT HEAD WITHOUT CONTRAST  TECHNIQUE: Contiguous axial images were obtained from the base of the skull through the vertex without intravenous contrast.  COMPARISON:  None.  FINDINGS: No mass lesion, mass effect, midline shift, hydrocephalus, hemorrhage. No territorial ischemia  or acute infarction. Visible paranasal sinuses appear normal.  IMPRESSION: Negative CT head.   Electronically Signed   By: Dereck Ligas M.D.   On: 11/07/2014 21:08    ECG  Normal sinus rhythm.  Within normal limits.  ASSESSMENT AND PLAN  1.  Atypical chest discomfort not suggestive of ischemia.  Suspect it is musculoskeletal. 2.  Possible TIA 3.  Essential hypertension without heart failure 4.  Hypercholesterolemia 5.  Diabetes mellitus type 2 6.  History of anxiety and panic attacks  Recommendation: Agree with plans for carotid Dopplers and  2-D echocardiogram.  No indication for another stress test at this point.  Continue aspirin.  Consider adding statin for elevated LDL levels in the face of diabetes.  Consider Ace or ARB for additional blood pressure control which may allow reduction in dose of HCTZ.   Signed, Darlin Coco, MD  11/08/2014, 7:50 AM

## 2014-11-08 NOTE — Progress Notes (Signed)
Family Medicine Teaching Service Daily Progress Note Intern Pager: [phone]  Patient name: Carolyn Holland Medical record number: [id number] Date of birth: 07-09-1971 Age: 43 y.o. Gender: female  Primary Care Provider: Glendon Axe, MD Consultants: none Code Status: FULL  Pt Overview and Major Events to Date:    Assessment and Plan: Carolyn Holland is a 43 y.o. female with a history of HTN, DM II, and anxiety presenting with facial twitching, slurred speech, tongue numbness, and chest pain.  Facial twitching, slurred speech, tongue numbness: concern is for TIA given constellation of symptoms and quick resolution, though given distribution of symptoms symptoms to multiple parts of the face it would be odd for this to have a CNS origin. Could potentially have been related to her anxiety/panic attacks, though it is prudent to work her up for TIA given her risk factors. No neurological deficits on exam again this am. CT head with no acute findings. ABCD2 score of 2 placing at a low 2 day stroke risk. Total cholesterol 211 but lipid panel otherwise unremarkable. -monitor on tele -continue home ASA 325 mg - will need to consider plavix if work-up is positive -MRI/MRA head -A1c in process -carotid dopplers and echo ordered -neuro checks q2 hours -consider PT/OT if she develops symptoms -will monitor BPs -suspect anxiety playing a part.  Zoloft started 12/18  Chest pain: atypical in nature with poking sensation, short duration, non-exertional, no dyspnea or radiation. EKG with no ischemic changes. Negative istat troponin. CXR with no acute findings. Likely to be a costochondritis or other MSK cause. Has risk factors for cardiac disease, though reported negative stress test recently makes this less likely. Unlikely to be PNA, PE, or pneumothorax given stable vital signs and normal CXR. Stress echo noted from 02/2012 that was negative for ischemia. Troponins negative x2. -continue ASA for pain -EKG  ordered -consider ibuprofen if above work up is negative  Type 2 DM: last A1c 7.6 06/06/14. On metformin at home. -check A1c here -SSI while hospitalized -consider alternate agent as patient does not like metformin  HTN: normotensive at time of admission. On home HCTZ. BP 131/70 this am. -will monitor BP at this time -hold HCTZ for now  OSA: patient reports not using a CPAP at home. Last sleep study 11/08/14 with mild OSA and did not have enough events to qualify for CPAP. -monitor -consider outpatient sleep eval if this is an issue in the future - can f/u with PCP for this  Anxiety: Patient with h/o anxiety.  Resulting in several ED visits.  Patient unable to undergo MRI d/t anxiety, even with ativan 0.5mg  -Start Zoloft 25mg  qd x7d.  Titrate up to 50mg  qd. -Patient counseled and agrees to try treatment.  FEN/GI: carb modified diet Prophylaxis: SQ heparin  Disposition:  Discharge pending completion of the above work-up  Subjective:  Patient reports that she has had no speech abnormalities this am but that she did feel tightness in her R jaw this am before she went down for the MRI.  MRI not attainable d/t patient's anxiety/ claustrophobia.  She admits to several ED visits for anxiety.  She is not on medication at home.  She states she has never taken an SSRI and is agreeable to starting one.  She denies any other focal deficits or weakness.  No CP, SOB, changes in vision.  Objective: Temp:  [97.7 F (36.5 C)-98.5 F (36.9 C)] 98.3 F (36.8 C) (12/18 0600) Pulse Rate:  [72-85] 74 (12/18 0600) Resp:  [12-23] 20 (  12/18 0600) BP: (122-147)/(79-100) 134/90 mmHg (12/18 0600) SpO2:  [96 %-100 %] 97 % (12/18 0600) Weight:  [270 lb 8 oz (122.698 kg)-272 lb (123.378 kg)] 270 lb 8 oz (122.698 kg) (12/18 0400) Physical Exam: General: awake, alert, well appearing, well nourished female, NAD Cardiovascular: RRR, no m/r/g, brisk cap refll Respiratory: CTAB, no increased WOB Abdomen: soft,  NT/ND, +BS Extremities: WWP, strength in tact B/L Neuro: CN 2-12 grossly in tact, cerebellar testing normal, speech normal, follows commands Psych: patient appears anxious the more she talks about symptoms that brought her in, speech slightly pressured but otherwise normal, good eye contact, affect appropriate, mood stable  Laboratory:  Recent Labs Lab 11/07/14 1816 11/08/14 0118  WBC 10.1 8.2  HGB 13.8 13.6  HCT 41.7 40.5  PLT 254 221    Recent Labs Lab 11/07/14 1816 11/08/14 0118  NA 138  --   K 3.8  --   CL 100  --   CO2 27  --   BUN 22  --   CREATININE 0.78 0.79  CALCIUM 9.3  --   GLUCOSE 274*  --    Cardiac Panel (last 3 results)  Recent Labs  11/08/14 0118 11/08/14 0559  TROPONINI <0.30 <0.30   Lipid Panel     Component Value Date/Time   CHOL 211* 11/08/2014 0118   TRIG 86 11/08/2014 0118   HDL 71 11/08/2014 0118   CHOLHDL 3.0 11/08/2014 0118   VLDL 17 11/08/2014 0118   LDLCALC 123* 11/08/2014 0118   Drugs of Abuse     Component Value Date/Time   LABOPIA NONE DETECTED 11/08/2014 0113   COCAINSCRNUR NONE DETECTED 11/08/2014 0113   LABBENZ NONE DETECTED 11/08/2014 0113   AMPHETMU NONE DETECTED 11/08/2014 0113   THCU NONE DETECTED 11/08/2014 0113   LABBARB NONE DETECTED 11/08/2014 0113     Imaging/Diagnostic Tests: Dg Chest 2 View  11/07/2014   CLINICAL DATA:  Chest pain for 5 days. Hypertension and sleep apnea.  EXAM: CHEST  2 VIEW  COMPARISON:  None.  FINDINGS: The heart size and mediastinal contours are within normal limits. Both lungs are clear. The visualized skeletal structures are unremarkable.  IMPRESSION: No active cardiopulmonary disease.   Electronically Signed   By: Kerby Moors M.D.   On: 11/07/2014 20:49   Ct Head Wo Contrast  11/07/2014   CLINICAL DATA:  Slurred speech. Numbness in the tongue and RIGHT hand. Initial encounter.  EXAM: CT HEAD WITHOUT CONTRAST  TECHNIQUE: Contiguous axial images were obtained from the base of the  skull through the vertex without intravenous contrast.  COMPARISON:  None.  FINDINGS: No mass lesion, mass effect, midline shift, hydrocephalus, hemorrhage. No territorial ischemia or acute infarction. Visible paranasal sinuses appear normal.  IMPRESSION: Negative CT head.   Electronically Signed   By: Dereck Ligas M.D.   On: 11/07/2014 21:08     Janora Norlander, DO 11/08/2014, 7:29 AM PGY-1, [facility] Intern pager: [phone], text pages welcome

## 2014-11-08 NOTE — Progress Notes (Signed)
*  PRELIMINARY RESULTS* Vascular Ultrasound Carotid Duplex (Doppler) has been completed.   Findings suggest 1-39% internal carotid artery stenosis bilaterally. Vertebral arteries are patent with antegrade flow.  11/08/2014 10:59 AM Maudry Mayhew, RVT, RDCS, RDMS

## 2014-11-08 NOTE — Discharge Summary (Signed)
[facility] Discharge Summary  Patient name: Carolyn Holland Medical record number: [id number] Date of birth: [birth year] Age: 43 y.o. Gender: female Date of Admission: 11/07/2014  Date of Discharge: 11/08/14 Admitting Physician: Zigmund Gottron, MD  Primary Care Provider: Glendon Axe, MD Consultants: Cardiology  Indication for Hospitalization: Slurred Speech, Numbness of tongue, facial twitching  Discharge Diagnoses/Problem List:  Anxiety HTN T2DM Atyptical CP TIA Slurred Speech Numbness of tongue Facial twitching  Disposition: Discharge home  Discharge Condition: Stable  Discharge Exam:  BP 112/64 mmHg  Pulse 67  Temp(Src) 97.9 F (36.6 C) (Oral)  Resp 18  Ht 5\' 6"  (1.676 m)  Wt 270 lb 8 oz (122.698 kg)  BMI 43.68 kg/m2  SpO2 97%   General: awake, alert, well appearing, well nourished female, NAD Cardiovascular: RRR, no m/r/g, brisk cap refll Respiratory: CTAB, no increased WOB Abdomen: soft, NT/ND, +BS Extremities: WWP, strength in tact B/L Neuro: CN 2-12 grossly in tact, cerebellar testing normal, speech normal, follows commands Psych: patient appears anxious the more she talks about symptoms that brought her in, speech slightly pressured but otherwise normal, good eye contact, affect appropriate, mood stable  Brief Hospital Course:  [name] is a 43 year old female that presented to ED after experiencing slurred speech, facial numbness/drawing, numbness of tip of her tongue and numbness of right hand at work.  She also reported having had CP for about 1 week.  In the ED the patient had a negative CT head and CXR. Istat troponin negative as well.   She was admitted to [facility] for TIA workup.  ABCD2 score 2.  On exam, patient appeared anxious, she exhibited point tenderness at her Left costochondral joints, and she was neurologically intact. CBC and CMET were unremarkable.  Troponins were negative x3.  EKG was NSR with no signs of  ischemia.  Patient continued on asprin.  Carotid dopplers and cardiac echo was obtained, which were unrevealing.  Lipid panel with a total cholesterol of 211.  For this reason and because patient is a T2 diabetic and is on antihypertensives, patient was started on a moderate dose statin.  A1c 7.8.  Patient reported that she did not like her Metformin.  MRI/MRA head was ordered.  Initially, patient was unable to undergo study d/t severe anxiety in spite of low dose oral ativan.  Conscious sedation was considered.  However, after receiving IV Ativan 2mg  patient was able to tolerate imaging.  MRI/MRA head revealed no acute processes.  Of note, patient experienced sensation of facial drawing before initial attempt at MRI/MRA.  As there was a high suspicion that anxiety was playing a role in patient's symptoms, patient was approached about starting an SSRI.  Patient was agreeable and was discharged with Zoloft with appropriate titration to therapeutic dose in 1 week.  She was observed through the afternoon after the MRI for safe discharge home, in the setting of Ativan.  Patient was discharged in stable condition home with recommendations for close follow up with her PCP.            Issues for Follow Up:   Patient started on Lipitor 20mg  in hospital for primary prevention.  Please follow up for any adverse symptoms and consider checking LFTs    Would consider following up on Metformin, as patient reported she did not like the medication.  Question of compliance in setting of A1c 7.8.   Significant Procedures: none  Significant Labs and Imaging:   Recent Labs Lab  11/07/14 1816 11/08/14 0118  WBC 10.1 8.2  HGB 13.8 13.6  HCT 41.7 40.5  PLT 254 221    Recent Labs Lab 11/07/14 1816 11/08/14 0118  NA 138  --   K 3.8  --   CL 100  --   CO2 27  --   GLUCOSE 274*  --   BUN 22  --   CREATININE 0.78 0.79  CALCIUM 9.3  --    Cardiac Panel (last 3 results)  Recent Labs  11/08/14 0118  11/08/14 0559 11/08/14 1255  TROPONINI <0.30 <0.30 <0.30   Lipid Panel     Component Value Date/Time   CHOL 211* 11/08/2014 0118   TRIG 86 11/08/2014 0118   HDL 71 11/08/2014 0118   CHOLHDL 3.0 11/08/2014 0118   VLDL 17 11/08/2014 0118   LDLCALC 123* 11/08/2014 0118   A1c: 7.8  Carotid Duplex (Doppler): Preliminary results Findings suggest 1-39% internal carotid artery stenosis bilaterally. Vertebral arteries are patent with antegrade flow.  2D Echo: Study Conclusions - Left ventricle: The cavity size was normal. Wall thickness was increased in a pattern of mild LVH. There was mild focal basal hypertrophy of the septum. Systolic function was normal. The estimated ejection fraction was in the range of 50% to 55%. Wall motion was normal; there were no regional wall motion abnormalities. Left ventricular diastolic function parameters were normal. - Atrial septum: No defect or patent foramen ovale was identified.  Dg Chest 2 View  11/07/2014   CLINICAL DATA:  Chest pain for 5 days. Hypertension and sleep apnea.  EXAM: CHEST  2 VIEW  COMPARISON:  None.  FINDINGS: The heart size and mediastinal contours are within normal limits. Both lungs are clear. The visualized skeletal structures are unremarkable.  IMPRESSION: No active cardiopulmonary disease.   Electronically Signed   By: Kerby Moors M.D.   On: 11/07/2014 20:49   Dg Cervical Spine Complete  11/08/2014   CLINICAL DATA:  RIGHT jaw pain and tingling, posterior neck pain spurring to lateral side side neck and up to head for about a week, facial numbness and tingling, TIA, hypertension, GERD  EXAM: CERVICAL SPINE  4+ VIEWS  COMPARISON:  None  FINDINGS: Reversal of cervical lordosis question muscle spasm.  Prevertebral soft tissues normal thickness.  Vertebral body and disc space heights maintained.  No acute fracture, subluxation or bone destruction.  Osseous foramina patent.  C1-C2 alignment normal.  IMPRESSION:  Question muscle spasm ; otherwise negative exam.   Electronically Signed   By: Lavonia Dana M.D.   On: 11/08/2014 10:00   Ct Head Wo Contrast  11/07/2014   CLINICAL DATA:  Slurred speech. Numbness in the tongue and RIGHT hand. Initial encounter.  EXAM: CT HEAD WITHOUT CONTRAST  TECHNIQUE: Contiguous axial images were obtained from the base of the skull through the vertex without intravenous contrast.  COMPARISON:  None.  FINDINGS: No mass lesion, mass effect, midline shift, hydrocephalus, hemorrhage. No territorial ischemia or acute infarction. Visible paranasal sinuses appear normal.  IMPRESSION: Negative CT head.   Electronically Signed   By: Dereck Ligas M.D.   On: 11/07/2014 21:08   Mri Brain Without Contrast  11/08/2014   CLINICAL DATA:  43 year old female with acute onset slurred speech, facial twitch, left chest pain. Suspected TIA. Initial encounter.  EXAM: MRI HEAD WITHOUT CONTRAST  MRA HEAD WITHOUT CONTRAST  TECHNIQUE: Multiplanar, multiecho pulse sequences of the brain and surrounding structures were obtained without intravenous contrast. Angiographic images of the head were  obtained using MRA technique without contrast.  COMPARISON:  Head CT without contrast 11/07/2014.  FINDINGS: MRI HEAD FINDINGS  Partially empty sella. Cerebral volume is normal. No restricted diffusion to suggest acute infarction. No midline shift, mass effect, evidence of mass lesion, ventriculomegaly, extra-axial collection or acute intracranial hemorrhage. Cervicomedullary junction and pituitary are within normal limits. Negative visualized cervical spine. Major intracranial vascular flow voids are preserved. [place] Cables and white matter signal is within normal limits throughout the brain.  Visible internal auditory structures appear normal. Visualized paranasal sinuses and mastoids are clear. Visualized orbit soft tissues are within normal limits. Visualized scalp soft tissues are within normal limits. Normal bone marrow  signal.  MRA HEAD FINDINGS  Antegrade flow in the posterior circulation, mildly dominant distal right vertebral artery. Normal PICA origins and vertebrobasilar junction. No basilar stenosis. Normal AICA and SCA origins. Normal PCA origins. Posterior communicating arteries are diminutive or absent. Bilateral PCA branches are within normal limits.  Antegrade flow in both ICA siphons. Mildly tortuous distal cervical left ICA. No siphon stenosis. Normal ophthalmic artery origins and carotid termini. Normal MCA and ACA origins. Normal anterior communicating artery and visualized bilateral ACA branches. Normal visualized bilateral MCA branches.  IMPRESSION: 1.  Normal non contrast MRI appearance of the brain. 2.  Negative intracranial MRA.   Electronically Signed   By: Lars Pinks M.D.   On: 11/08/2014 14:34   Mr Jodene Nam Head/brain Wo Cm  11/08/2014   CLINICAL DATA:  43 year old female with acute onset slurred speech, facial twitch, left chest pain. Suspected TIA. Initial encounter.  EXAM: MRI HEAD WITHOUT CONTRAST  MRA HEAD WITHOUT CONTRAST  TECHNIQUE: Multiplanar, multiecho pulse sequences of the brain and surrounding structures were obtained without intravenous contrast. Angiographic images of the head were obtained using MRA technique without contrast.  COMPARISON:  Head CT without contrast 11/07/2014.  FINDINGS: MRI HEAD FINDINGS  Partially empty sella. Cerebral volume is normal. No restricted diffusion to suggest acute infarction. No midline shift, mass effect, evidence of mass lesion, ventriculomegaly, extra-axial collection or acute intracranial hemorrhage. Cervicomedullary junction and pituitary are within normal limits. Negative visualized cervical spine. Major intracranial vascular flow voids are preserved. Pear[place] Cables and white matter signal is within normal limits throughout the brain.  Visible internal auditory structures appear normal. Visualized paranasal sinuses and mastoids are clear. Visualized orbit soft  tissues are within normal limits. Visualized scalp soft tissues are within normal limits. Normal bone marrow signal.  MRA HEAD FINDINGS  Antegrade flow in the posterior circulation, mildly dominant distal right vertebral artery. Normal PICA origins and vertebrobasilar junction. No basilar stenosis. Normal AICA and SCA origins. Normal PCA origins. Posterior communicating arteries are diminutive or absent. Bilateral PCA branches are within normal limits.  Antegrade flow in both ICA siphons. Mildly tortuous distal cervical left ICA. No siphon stenosis. Normal ophthalmic artery origins and carotid termini. Normal MCA and ACA origins. Normal anterior communicating artery and visualized bilateral ACA branches. Normal visualized bilateral MCA branches.  IMPRESSION: 1.  Normal non contrast MRI appearance of the brain. 2.  Negative intracranial MRA.   Electronically Signed   By: Lars Pinks M.D.   On: 11/08/2014 14:34     Results/Tests Pending at Time of Discharge: Official carotid doppler read  Discharge Medications:    Medication List    TAKE these medications        aspirin 325 MG tablet  Take 325 mg by mouth once.     atorvastatin 20 MG tablet  Commonly known as:  LIPITOR  Take 1 tablet (20 mg total) by mouth daily at 6 PM.     HYDROCHLOROTHIAZIDE PO  Take 12.5 mg by mouth daily.     metFORMIN 500 MG tablet  Commonly known as:  GLUCOPHAGE  Take 1 tablet (500 mg total) by mouth 2 (two) times daily with a meal.     sertraline 50 MG tablet  Commonly known as:  ZOLOFT  Take 0.5 tablet (25mg ) daily x7 days.  Then take 1 tablet (50mg ) daily.     Vitamin D (Ergocalciferol) 50000 UNITS Caps capsule  Commonly known as:  DRISDOL  Take 1 capsule (50,000 Units total) by mouth every 7 (seven) days.        Discharge Instructions: Please refer to Patient Instructions section of EMR for full details.  Patient was counseled important signs and symptoms that should prompt return to medical care, changes  in medications, dietary instructions, activity restrictions, and follow up appointments.   Follow-Up Appointments: Follow-up Information    Follow up with Glendon Axe, MD. Schedule an appointment as soon as possible for a visit in 2 weeks.   Specialty:  Family Medicine   Why:  hospital follow up   Contact information:   [address] [place] [address] [place], DO 11/09/2014, 1:38 PM PGY-1, [facility]

## 2014-11-29 ENCOUNTER — Ambulatory Visit (HOSPITAL_COMMUNITY): Payer: 59

## 2014-11-29 ENCOUNTER — Other Ambulatory Visit (HOSPITAL_COMMUNITY): Payer: 59

## 2014-12-12 ENCOUNTER — Ambulatory Visit: Payer: 59 | Admitting: Dietician

## 2014-12-28 ENCOUNTER — Ambulatory Visit: Payer: Self-pay | Admitting: Dietician

## 2015-03-05 ENCOUNTER — Emergency Department (HOSPITAL_COMMUNITY)
Admission: EM | Admit: 2015-03-05 | Discharge: 2015-03-06 | Disposition: A | Discharge status: Home / Self Care | Payer: 59 | Attending: Emergency Medicine | Admitting: Emergency Medicine

## 2015-03-05 ENCOUNTER — Encounter (HOSPITAL_COMMUNITY): Payer: Self-pay | Admitting: *Deleted

## 2015-03-05 DIAGNOSIS — Z8719 Personal history of other diseases of the digestive system: Secondary | ICD-10-CM | POA: Diagnosis not present

## 2015-03-05 DIAGNOSIS — Z8669 Personal history of other diseases of the nervous system and sense organs: Secondary | ICD-10-CM | POA: Diagnosis not present

## 2015-03-05 DIAGNOSIS — F410 Panic disorder [episodic paroxysmal anxiety] without agoraphobia: Secondary | ICD-10-CM | POA: Insufficient documentation

## 2015-03-05 DIAGNOSIS — R002 Palpitations: Secondary | ICD-10-CM | POA: Diagnosis not present

## 2015-03-05 DIAGNOSIS — Z79899 Other long term (current) drug therapy: Secondary | ICD-10-CM | POA: Insufficient documentation

## 2015-03-05 DIAGNOSIS — I10 Essential (primary) hypertension: Secondary | ICD-10-CM | POA: Diagnosis not present

## 2015-03-05 DIAGNOSIS — Z7982 Long term (current) use of aspirin: Secondary | ICD-10-CM | POA: Insufficient documentation

## 2015-03-05 DIAGNOSIS — M79602 Pain in left arm: Secondary | ICD-10-CM | POA: Insufficient documentation

## 2015-03-05 DIAGNOSIS — Z862 Personal history of diseases of the blood and blood-forming organs and certain disorders involving the immune mechanism: Secondary | ICD-10-CM | POA: Insufficient documentation

## 2015-03-05 DIAGNOSIS — R079 Chest pain, unspecified: Secondary | ICD-10-CM | POA: Diagnosis present

## 2015-03-05 DIAGNOSIS — Z880 Allergy status to penicillin: Secondary | ICD-10-CM | POA: Insufficient documentation

## 2015-03-05 LAB — CBC WITH DIFFERENTIAL/PLATELET
BASOS ABS: 0.1 10*3/uL (ref 0.0–0.1)
BASOS PCT: 1 % (ref 0–1)
Eosinophils Absolute: 0.3 10*3/uL (ref 0.0–0.7)
Eosinophils Relative: 4 % (ref 0–5)
HEMATOCRIT: 42.4 % (ref 36.0–46.0)
HEMOGLOBIN: 14.1 g/dL (ref 12.0–15.0)
Lymphocytes Relative: 44 % (ref 12–46)
Lymphs Abs: 3 10*3/uL (ref 0.7–4.0)
MCH: 27.6 pg (ref 26.0–34.0)
MCHC: 33.3 g/dL (ref 30.0–36.0)
MCV: 83 fL (ref 78.0–100.0)
Monocytes Absolute: 0.6 10*3/uL (ref 0.1–1.0)
Monocytes Relative: 8 % (ref 3–12)
NEUTROS ABS: 2.9 10*3/uL (ref 1.7–7.7)
NEUTROS PCT: 43 % (ref 43–77)
Platelets: 233 10*3/uL (ref 150–400)
RBC: 5.11 MIL/uL (ref 3.87–5.11)
RDW: 12.8 % (ref 11.5–15.5)
WBC: 6.8 10*3/uL (ref 4.0–10.5)

## 2015-03-05 LAB — I-STAT TROPONIN, ED: TROPONIN I, POC: 0 ng/mL (ref 0.00–0.08)

## 2015-03-05 NOTE — ED Notes (Signed)
Pt states that she has a hx of anxiety and the chest tightness is similar to that when she has anxiety. States that this pain is worse because her arm is numb and she feels chest flutters.

## 2015-03-05 NOTE — ED Notes (Signed)
Patient presents with c/o left arm "burning", left chest hurting and feels like she is choking.  + nausea  Took her Lipitor and 325mg  ASA at home.

## 2015-03-06 ENCOUNTER — Emergency Department (HOSPITAL_COMMUNITY): Payer: 59

## 2015-03-06 LAB — COMPREHENSIVE METABOLIC PANEL
ALT: 18 U/L (ref 0–35)
ANION GAP: 9 (ref 5–15)
AST: 22 U/L (ref 0–37)
Albumin: 3.8 g/dL (ref 3.5–5.2)
Alkaline Phosphatase: 57 U/L (ref 39–117)
BUN: 15 mg/dL (ref 6–23)
CO2: 27 mmol/L (ref 19–32)
Calcium: 9.3 mg/dL (ref 8.4–10.5)
Chloride: 102 mmol/L (ref 96–112)
Creatinine, Ser: 0.83 mg/dL (ref 0.50–1.10)
GFR calc Af Amer: >90 mL/min (ref >90)
GFR calc non Af Amer: 85 mL/min — ABNORMAL LOW (ref >90)
Glucose, Bld: 119 mg/dL — ABNORMAL HIGH (ref 70–99)
Potassium: 3.5 mmol/L (ref 3.5–5.1)
SODIUM: 138 mmol/L (ref 135–145)
Total Bilirubin: 0.8 mg/dL (ref 0.3–1.2)
Total Protein: 6.9 g/dL (ref 6.0–8.3)

## 2015-03-06 MED ORDER — NAPROXEN 500 MG PO TABS: 500.0000 mg | ORAL_TABLET | Freq: Two times a day (BID) | ORAL | Status: DC

## 2015-03-06 MED ORDER — NAPROXEN 250 MG PO TABS
500.0000 mg | ORAL_TABLET | Freq: Once | ORAL | Status: AC
  Administered 2015-03-06: 500 mg via ORAL
  Filled 2015-03-06: qty 2

## 2015-03-06 NOTE — ED Provider Notes (Signed)
CSN: [id number]     Arrival date & time 03/05/15  2306 History   First MD Initiated Contact with Patient 03/06/15 0038     This chart was scribed for Linton Flemings, MD by Forrestine Him, ED Scribe. This patient was seen in room D33C/D33C and the patient's care was started 1:37 AM.   Chief Complaint  Patient presents with  . Chest Pain  . Arm Pain   The history is provided by the patient. No language interpreter was used.    HPI Comments: Carolyn Holland is a 44 y.o. female with a PMHx with GERD, anxiety, HTN, and anemia who presents to the Emergency Department complaining of constant, ongoing L arm pain onset 8:00 PM yesterday evening 4/13. Pain is described as "burning". No aggravating of alleviating factors. Carolyn Holland took a dose of Aspirin which did partially resolve discomfort. No new recent activities or repetative motions of shoulder. She also reports feeling "fluttering" in her chest along with feeling "choked up". No hisory of nerve damage. Pt works as a Administrator at [employer]. Mother has a history of heart issues in her 92's which she attributes to drug use. Pt with known allergy to Penicillins.  Past Medical History  Diagnosis Date  . GERD (gastroesophageal reflux disease) 2000    come and go  . Sleep apnea 2012  . Anxiety 2012  . Panic attack 2012    situational related to move from [place].   [name] Hypertension 2008  . Bilateral anterior knee pain 2012  . Anemia 12/22/2012   Past Surgical History  Procedure Laterality Date  . Partial hysterectomy  2009  . Laproscopic knee surgery  2011    L knee for bilateral meniscal tear. Following acute injury.   . Abdominal hysterectomy    . Cesarean section  2002   Family History  Problem Relation Age of Onset  . Diabetes Mother   . Hypertension Mother   . Hypertension Father   . Heart disease Mother 18  . Stroke Mother 40    x 2  . Alcohol abuse Mother     previous   . Drug abuse Mother     previous cocaine    History   Substance Use Topics  . Smoking status: Never Smoker   . Smokeless tobacco: Never Used  . Alcohol Use: No     Comment: occassional, social    OB History    Gravida Para Term Preterm AB TAB SAB Ectopic Multiple Living   4 3 3  1 1    3      Review of Systems  Constitutional: Negative for fever and chills.  Respiratory: Negative for shortness of breath.   Cardiovascular: Negative for chest pain.  Musculoskeletal: Positive for arthralgias.  Skin: Negative for rash.  Neurological: Negative for weakness and numbness.  Psychiatric/Behavioral: Negative for confusion.      Allergies  Penicillins  Home Medications   Prior to Admission medications   Medication Sig Start Date End Date Taking? Authorizing Provider  aspirin 325 MG tablet Take 325 mg by mouth daily.    Yes Historical Provider, MD  atorvastatin (LIPITOR) 20 MG tablet Take 1 tablet (20 mg total) by mouth daily at 6 PM. 11/08/14  Yes Ashly M Gottschalk, DO  hydrochlorothiazide (HYDRODIURIL) 12.5 MG tablet Take 12.5 mg by mouth daily.  02/12/15  Yes Historical Provider, MD  metFORMIN (GLUCOPHAGE-XR) 500 MG 24 hr tablet Take 500 mg by mouth daily with breakfast.  02/20/15  Yes Historical Provider,  MD  metFORMIN (GLUCOPHAGE) 500 MG tablet Take 1 tablet (500 mg total) by mouth 2 (two) times daily with a meal. Patient not taking: Reported on 03/06/2015 04/19/14   Boykin Nearing, MD  sertraline (ZOLOFT) 50 MG tablet Take 0.5 tablet (25mg ) daily x7 days.  Then take 1 tablet (50mg ) daily. Patient not taking: Reported on 03/06/2015 11/08/14   Janora Norlander, DO  Vitamin D, Ergocalciferol, (DRISDOL) 50000 UNITS CAPS capsule Take 1 capsule (50,000 Units total) by mouth every 7 (seven) days. Patient not taking: Reported on 03/06/2015 08/24/13   Boykin Nearing, MD   Triage Vitals: BP 130/83 mmHg  Pulse 64  Temp(Src) 97.9 F (36.6 C) (Oral)  Resp 18  Wt 267 lb (121.11 kg)  SpO2 100%   Physical Exam  Constitutional: She is oriented  to person, place, and time. She appears well-developed and well-nourished.  HENT:  Head: Normocephalic and atraumatic.  Nose: Nose normal.  Mouth/Throat: Oropharynx is clear and moist.  Eyes: Conjunctivae and EOM are normal. Pupils are equal, round, and reactive to light.  Neck: Normal range of motion. Neck supple. No JVD present. No tracheal deviation present. No thyromegaly present.  Cardiovascular: Normal rate, regular rhythm, normal heart sounds and intact distal pulses.  Exam reveals no gallop and no friction rub.   No murmur heard. Pulmonary/Chest: Effort normal and breath sounds normal. No stridor. No respiratory distress. She has no wheezes. She has no rales. She exhibits no tenderness.  Abdominal: Soft. Bowel sounds are normal. She exhibits no distension and no mass. There is no tenderness. There is no rebound and no guarding.  Musculoskeletal: Normal range of motion. She exhibits tenderness ( with palpation of paraspinal musculature on left cervical region, left trapezius and right lateral shoulder.  There is no step-off or crepitus.  Normal range of motion.  Normal sensation.  Distally neurovascularly intact. ). She exhibits no edema.  Lymphadenopathy:    She has no cervical adenopathy.  Neurological: She is alert and oriented to person, place, and time. She displays normal reflexes. She exhibits normal muscle tone. Coordination normal.  Skin: Skin is warm and dry. No rash noted. No erythema. No pallor.  Psychiatric: She has a normal mood and affect. Her behavior is normal. Judgment and thought content normal.  Nursing note and vitals reviewed.   ED Course  Procedures (including critical care time)  DIAGNOSTIC STUDIES: Oxygen Saturation is 100% on RA, [place] by my interpretation.    COORDINATION OF CARE: 1:37 AM-Discussed treatment plan with pt at bedside and pt agreed to plan.     Labs Review Labs Reviewed  COMPREHENSIVE METABOLIC PANEL - Abnormal; Notable for the  following:    Glucose, Bld 119 (*)    GFR calc non Af Amer 85 (*)    All other components within normal limits  CBC WITH DIFFERENTIAL/PLATELET  Randolm Idol, ED    Imaging Review Dg Chest 2 View  03/06/2015   CLINICAL DATA:  Shortness of breath, upper chest tightness, left arm pain. Symptoms for 1 day.  EXAM: CHEST  2 VIEW  COMPARISON:  02/05/2015  FINDINGS: The cardiomediastinal contours are normal. The lungs are clear. Pulmonary vasculature is normal. No consolidation, pleural effusion, or pneumothorax. Mild degenerative change in the thoracic spine. No acute osseous abnormalities are seen.  IMPRESSION: No acute pulmonary process.   Electronically Signed   By: Jeb Levering M.D.   On: 03/06/2015 01:59     EKG Interpretation   Date/Time:  Wednesday March 05 2015  23:13:12 EDT Ventricular Rate:  71 PR Interval:  170 QRS Duration: 92 QT Interval:  402 QTC Calculation: 436 R Axis:   46 Text Interpretation:  Normal sinus rhythm Normal ECG Confirmed by Ernesto Lashway   MD, Janae Bonser ([id number]) on 03/06/2015 1:52:54 AM      MDM   Final diagnoses:  Left arm pain  Palpitations   44 year old female with fluttering sensation after onset of left shoulder pain that radiated up into her neck and down into her arm.  Pain is described as burning in nature.  Patient has history of hypertension, hyperlipidemia and borderline diabetes, has family history of coronary disease.  Symptoms today, however, do not seem cardiac in nature.  No diaphoresis, no chest pain, no jaw pain.  Pain is reproducible with palpation of the left trapezius and shoulder.  Patient has an underlying history of general generalized anxiety disorder EKG and troponin I normal.  I feel that patient is stable for discharge to home.  Will treat with Naprosyn.  Patient is stable for discharge home.    I personally performed the services described in this documentation, which was scribed in my presence. The recorded information has been reviewed  and is accurate.    Linton Flemings, MD 03/06/15 [phone]

## 2015-03-06 NOTE — Discharge Instructions (Signed)
Follow-up with your Dr. for recheck in 5-7 days.  Return to the emergency department for worsening condition or new concerning symptoms.  No signs of heart attack were noted on today's evaluation.  It is thought that your left arm pain is due to a musculoskeletal source.  Take Naprosyn as prescribed.  Use warm moist heat.      Heat Therapy Heat therapy can help make painful, stiff muscles and joints feel better. Do not use heat on new injuries. Wait at least 48 hours after an injury to use heat. Do not use heat when you have aches or pains right after an activity. If you still have pain 3 hours after stopping the activity, then you may use heat. HOME CARE Wet heat pack  Soak a clean towel in warm water. Squeeze out the extra water.  Put the warm, wet towel in a plastic bag.  Place a thin, dry towel between your skin and the bag.  Put the heat pack on the area for 5 minutes, and check your skin. Your skin may be pink, but it should not be red.  Leave the heat pack on the area for 15 to 30 minutes.  Repeat this every 2 to 4 hours while awake. Do not use heat while you are sleeping. Warm water bath  Fill a tub with warm water.  Place the affected body part in the tub.  Soak the area for 20 to 40 minutes.  Repeat as needed. Hot water bottle  Fill the water bottle half full with hot water.  Press out the extra air. Close the cap tightly.  Place a dry towel between your skin and the bottle.  Put the bottle on the area for 5 minutes, and check your skin. Your skin may be pink, but it should not be red.  Leave the bottle on the area for 15 to 30 minutes.  Repeat this every 2 to 4 hours while awake. Electric heating pad  Place a dry towel between your skin and the heating pad.  Set the heating pad on low heat.  Put the heating pad on the area for 10 minutes, and check your skin. Your skin may be pink, but it should not be red.  Leave the heating pad on the area for 20 to 40  minutes.  Repeat this every 2 to 4 hours while awake.  Do not lie on the heating pad.  Do not fall asleep while using the heating pad.  Do not use the heating pad near water. GET HELP RIGHT AWAY IF:  You get blisters or red skin.  Your skin is puffy (swollen), or you lose feeling (numbness) in the affected area.  You have any new problems.  Your problems are getting worse.  You have any questions or concerns. If you have any problems, stop using heat therapy until you see your doctor. MAKE SURE YOU:  Understand these instructions.  Will watch your condition.  Will get help right away if you are not doing well or get worse. Document Released: 01/31/2012 Document Reviewed: 01/01/2014 [facility] Patient Information 2015 [place]. This information is not intended to replace advice given to you by your health care provider. Make sure you discuss any questions you have with your health care provider.  Palpitations A palpitation is the feeling that your heartbeat is irregular or is faster than normal. It may feel like your heart is fluttering or skipping a beat. Palpitations are usually not a serious problem. However,  in some cases, you may need further medical evaluation. CAUSES  Palpitations can be caused by:  Smoking.  Caffeine or other stimulants, such as diet pills or energy drinks.  Alcohol.  Stress and anxiety.  Strenuous physical activity.  Fatigue.  Certain medicines.  Heart disease, especially if you have a history of irregular heart rhythms (arrhythmias), such as atrial fibrillation, atrial flutter, or supraventricular tachycardia.  An improperly working pacemaker or defibrillator. DIAGNOSIS  To find the cause of your palpitations, your health care provider will take your medical history and perform a physical exam. Your health care provider may also have you take a test called an ambulatory electrocardiogram (ECG). An ECG records your heartbeat patterns  over a 24-hour period. You may also have other tests, such as:  Transthoracic echocardiogram (TTE). During echocardiography, sound waves are used to evaluate how blood flows through your heart.  Transesophageal echocardiogram (TEE).  Cardiac monitoring. This allows your health care provider to monitor your heart rate and rhythm in real time.  Holter monitor. This is a portable device that records your heartbeat and can help diagnose heart arrhythmias. It allows your health care provider to track your heart activity for several days, if needed.  Stress tests by exercise or by giving medicine that makes the heart beat faster. TREATMENT  Treatment of palpitations depends on the cause of your symptoms and can vary greatly. Most cases of palpitations do not require any treatment other than time, relaxation, and monitoring your symptoms. Other causes, such as atrial fibrillation, atrial flutter, or supraventricular tachycardia, usually require further treatment. HOME CARE INSTRUCTIONS   Avoid:  Caffeinated coffee, tea, soft drinks, diet pills, and energy drinks.  Chocolate.  Alcohol.  Stop smoking if you smoke.  Reduce your stress and anxiety. Things that can help you relax include:  A method of controlling things in your body, such as your heartbeats, with your mind (biofeedback).  Yoga.  Meditation.  Physical activity such as swimming, jogging, or walking.  Get plenty of rest and sleep. SEEK MEDICAL CARE IF:   You continue to have a fast or irregular heartbeat beyond 24 hours.  Your palpitations occur more often. SEEK IMMEDIATE MEDICAL CARE IF:  You have chest pain or shortness of breath.  You have a severe headache.  You feel dizzy or you faint. MAKE SURE YOU:  Understand these instructions.  Will watch your condition.  Will get help right away if you are not doing well or get worse. Document Released: 11/05/2000 Document Revised: 11/13/2013 Document Reviewed:  01/07/2012 [facility] Patient Information 2015 [place], [place]. This information is not intended to replace advice given to you by your health care provider. Make sure you discuss any questions you have with your health care provider.  Musculoskeletal Pain Musculoskeletal pain is muscle and boney aches and pains. These pains can occur in any part of the body. Your caregiver may treat you without knowing the cause of the pain. They may treat you if blood or urine tests, X-rays, and other tests were normal.  CAUSES There is often not a definite cause or reason for these pains. These pains may be caused by a type of germ (virus). The discomfort may also come from overuse. Overuse includes working out too hard when your body is not fit. Boney aches also come from weather changes. Bone is sensitive to atmospheric pressure changes. HOME CARE INSTRUCTIONS   Ask when your test results will be ready. Make sure you get your test results.  Only  take over-the-counter or prescription medicines for pain, discomfort, or fever as directed by your caregiver. If you were given medications for your condition, do not drive, operate machinery or power tools, or sign legal documents for 24 hours. Do not drink alcohol. Do not take sleeping pills or other medications that may interfere with treatment.  Continue all activities unless the activities cause more pain. When the pain lessens, slowly resume normal activities. Gradually increase the intensity and duration of the activities or exercise.  During periods of severe pain, bed rest may be helpful. Lay or sit in any position that is comfortable.  Putting ice on the injured area.  Put ice in a bag.  Place a towel between your skin and the bag.  Leave the ice on for 15 to 20 minutes, 3 to 4 times a day.  Follow up with your caregiver for continued problems and no reason can be found for the pain. If the pain becomes worse or does not go away, it may be necessary to  repeat tests or do additional testing. Your caregiver may need to look further for a possible cause. SEEK IMMEDIATE MEDICAL CARE IF:  You have pain that is getting worse and is not relieved by medications.  You develop chest pain that is associated with shortness or breath, sweating, feeling sick to your stomach (nauseous), or throw up (vomit).  Your pain becomes localized to the abdomen.  You develop any new symptoms that seem different or that concern you. MAKE SURE YOU:   Understand these instructions.  Will watch your condition.  Will get help right away if you are not doing well or get worse. Document Released: 11/08/2005 Document Revised: 01/31/2012 Document Reviewed: 07/13/2013 [facility] Patient Information 2015 [place], [place]. This information is not intended to replace advice given to you by your health care provider. Make sure you discuss any questions you have with your health care provider.

## 2015-03-06 NOTE — ED Notes (Signed)
Dr Otter at bedside  

## 2015-04-14 ENCOUNTER — Telehealth: Payer: Self-pay | Admitting: Internal Medicine

## 2015-04-14 NOTE — Telephone Encounter (Signed)
LM for pt to call back regarding inquiry for NP appt with Dr. Cruzita Lederer  Self referral DM T1, Vitamin D Deficiency

## 2015-06-05 ENCOUNTER — Other Ambulatory Visit: Payer: Self-pay | Admitting: Obstetrics & Gynecology

## 2015-06-06 LAB — CYTOLOGY - PAP

## 2015-09-27 ENCOUNTER — Emergency Department (HOSPITAL_COMMUNITY)
Admission: EM | Admit: 2015-09-27 | Discharge: 2015-09-27 | Disposition: A | Discharge status: Home / Self Care | Payer: 59 | Attending: Emergency Medicine | Admitting: Emergency Medicine

## 2015-09-27 ENCOUNTER — Encounter (HOSPITAL_COMMUNITY): Payer: Self-pay | Admitting: *Deleted

## 2015-09-27 DIAGNOSIS — Z791 Long term (current) use of non-steroidal anti-inflammatories (NSAID): Secondary | ICD-10-CM | POA: Insufficient documentation

## 2015-09-27 DIAGNOSIS — R55 Syncope and collapse: Secondary | ICD-10-CM | POA: Diagnosis not present

## 2015-09-27 DIAGNOSIS — Z8719 Personal history of other diseases of the digestive system: Secondary | ICD-10-CM | POA: Diagnosis not present

## 2015-09-27 DIAGNOSIS — Z880 Allergy status to penicillin: Secondary | ICD-10-CM | POA: Insufficient documentation

## 2015-09-27 DIAGNOSIS — F419 Anxiety disorder, unspecified: Secondary | ICD-10-CM | POA: Diagnosis not present

## 2015-09-27 DIAGNOSIS — R42 Dizziness and giddiness: Secondary | ICD-10-CM

## 2015-09-27 DIAGNOSIS — Z862 Personal history of diseases of the blood and blood-forming organs and certain disorders involving the immune mechanism: Secondary | ICD-10-CM | POA: Diagnosis not present

## 2015-09-27 DIAGNOSIS — Z79899 Other long term (current) drug therapy: Secondary | ICD-10-CM | POA: Diagnosis not present

## 2015-09-27 DIAGNOSIS — Z7982 Long term (current) use of aspirin: Secondary | ICD-10-CM | POA: Diagnosis not present

## 2015-09-27 DIAGNOSIS — I10 Essential (primary) hypertension: Secondary | ICD-10-CM | POA: Diagnosis not present

## 2015-09-27 LAB — CBC
HCT: 45.1 % (ref 36.0–46.0)
Hemoglobin: 15.1 g/dL — ABNORMAL HIGH (ref 12.0–15.0)
MCH: 27.9 pg (ref 26.0–34.0)
MCHC: 33.5 g/dL (ref 30.0–36.0)
MCV: 83.4 fL (ref 78.0–100.0)
Platelets: 264 10*3/uL (ref 150–400)
RBC: 5.41 MIL/uL — ABNORMAL HIGH (ref 3.87–5.11)
RDW: 13.1 % (ref 11.5–15.5)
WBC: 5.7 10*3/uL (ref 4.0–10.5)

## 2015-09-27 LAB — BASIC METABOLIC PANEL
Anion gap: 5 (ref 5–15)
BUN: 13 mg/dL (ref 6–20)
CALCIUM: 9.3 mg/dL (ref 8.9–10.3)
CO2: 29 mmol/L (ref 22–32)
CREATININE: 0.81 mg/dL (ref 0.44–1.00)
Chloride: 105 mmol/L (ref 101–111)
Glucose, Bld: 92 mg/dL (ref 65–99)
Potassium: 3.5 mmol/L (ref 3.5–5.1)
SODIUM: 139 mmol/L (ref 135–145)

## 2015-09-27 MED ORDER — LORAZEPAM 1 MG PO TABS: 1.0000 mg | ORAL_TABLET | Freq: Three times a day (TID) | ORAL | Status: DC | PRN

## 2015-09-27 NOTE — Discharge Instructions (Signed)
It was our pleasure to provide your ER care today - we hope that you feel better.  Drink plenty of fluids, including adequate water.  Eat balanced diet, and do not skip meals.  It is especially important to eat a good breakfast.  Get adequate sleep at night.   Minimize caffeine use, especially after noon.   Make adequate time for rest/relaxation activities.   You may take ativan as need for anxiety - no driving when taking.  Follow up with primary care doctor in the next 1-2 weeks.  Return to ER if worse, new symptoms, fainting episodes, trouble breathing, fevers, medical emergency, other concern.      Near-Syncope Near-syncope (commonly known as near fainting) is sudden weakness, dizziness, or feeling like you might pass out. During an episode of near-syncope, you may also develop pale skin, have tunnel vision, or feel sick to your stomach (nauseous). Near-syncope may occur when getting up after sitting or while standing for a long time. It is caused by a sudden decrease in blood flow to the brain. This decrease can result from various causes or triggers, most of which are not serious. However, because near-syncope can sometimes be a sign of something serious, a medical evaluation is required. The specific cause is often not determined. HOME CARE INSTRUCTIONS  Monitor your condition for any changes. The following actions may help to alleviate any discomfort you are experiencing:  Have someone stay with you until you feel stable.  Lie down right away and prop your feet up if you start feeling like you might faint. Breathe deeply and steadily. Wait until all the symptoms have passed. Most of these episodes last only a few minutes. You may feel tired for several hours.   Drink enough fluids to keep your urine clear or pale yellow.   If you are taking blood pressure or heart medicine, get up slowly when seated or lying down. Take several minutes to sit and then stand. This can reduce  dizziness.  Follow up with your health care provider as directed. SEEK IMMEDIATE MEDICAL CARE IF:   You have a severe headache.   You have unusual pain in the chest, abdomen, or back.   You are bleeding from the mouth or rectum, or you have black or tarry stool.   You have an irregular or very fast heartbeat.   You have repeated fainting or have seizure-like jerking during an episode.   You faint when sitting or lying down.   You have confusion.   You have difficulty walking.   You have severe weakness.   You have vision problems.  MAKE SURE YOU:   Understand these instructions.  Will watch your condition.  Will get help right away if you are not doing well or get worse.   This information is not intended to replace advice given to you by your health care provider. Make sure you discuss any questions you have with your health care provider.   Document Released: 11/08/2005 Document Revised: 11/13/2013 Document Reviewed: 04/13/2013 Elsevier Interactive Patient Education 2016 [employer]    Generalized Anxiety Disorder Generalized anxiety disorder (GAD) is a mental disorder. It interferes with life functions, including relationships, work, and school. GAD is different from normal anxiety, which everyone experiences at some point in their lives in response to specific life events and activities. Normal anxiety actually helps [place] prepare for and get through these life events and activities. Normal anxiety goes away after the event or activity is over.  GAD causes anxiety that is not necessarily related to specific events or activities. It also causes excess anxiety in proportion to specific events or activities. The anxiety associated with GAD is also difficult to control. GAD can vary from mild to severe. People with severe GAD can have intense waves of anxiety with physical symptoms (panic attacks).  SYMPTOMS The anxiety and worry associated with GAD are  difficult to control. This anxiety and worry are related to many life events and activities and also occur more days than not for 6 months or longer. People with GAD also have three or more of the following symptoms (one or more in children):  Restlessness.   Fatigue.  Difficulty concentrating.   Irritability.  Muscle tension.  Difficulty sleeping or unsatisfying sleep. DIAGNOSIS GAD is diagnosed through an assessment by your health care provider. Your health care provider will ask you questions aboutyour mood,physical symptoms, and events in your life. Your health care provider may ask you about your medical history and use of alcohol or drugs, including prescription medicines. Your health care provider may also do a physical exam and blood tests. Certain medical conditions and the use of certain substances can cause symptoms similar to those associated with GAD. Your health care provider may refer you to a mental health specialist for further evaluation. TREATMENT The following therapies are usually used to treat GAD:   Medication. Antidepressant medication usually is prescribed for long-term daily control. Antianxiety medicines may be added in severe cases, especially when panic attacks occur.   Talk therapy (psychotherapy). Certain types of talk therapy can be helpful in treating GAD by providing support, education, and guidance. A form of talk therapy called cognitive behavioral therapy can teach you healthy ways to think about and react to daily life events and activities.  Stress managementtechniques. These include yoga, meditation, and exercise and can be very helpful when they are practiced regularly. A mental health specialist can help determine which treatment is best for you. Some people see improvement with one therapy. However, other people require a combination of therapies.   This information is not intended to replace advice given to you by your health care provider.  Make sure you discuss any questions you have with your health care provider.   Document Released: 03/05/2013 Document Revised: 11/29/2014 Document Reviewed: 03/05/2013 Elsevier Interactive Patient Education 2016 [place] and Stress Management Stress is a normal reaction to life events. It is what you feel when life demands more than you are used to or more than you can handle. Some stress can be useful. For example, the stress reaction can help you catch the last bus of the day, study for a test, or meet a deadline at work. But stress that occurs too often or for too long can cause problems. It can affect your emotional health and interfere with relationships and normal daily activities. Too much stress can weaken your immune system and increase your risk for physical illness. If you already have a medical problem, stress can make it worse. CAUSES  All sorts of life events may cause stress. An event that causes stress for one person may not be stressful for another person. Major life events commonly cause stress. These may be positive or negative. Examples include losing your job, moving into a new home, getting married, having a baby, or losing a loved one. Less obvious life events may also cause stress, especially if they occur day after day or in combination.  Examples include working long hours, driving in traffic, caring for children, being in debt, or being in a difficult relationship. SIGNS AND SYMPTOMS Stress may cause emotional symptoms including, the following:  Anxiety. This is feeling worried, afraid, on edge, overwhelmed, or out of control.  Anger. This is feeling irritated or impatient.  Depression. This is feeling sad, down, helpless, or guilty.  Difficulty focusing, remembering, or making decisions. Stress may cause physical symptoms, including the following:   Aches and pains. These may affect your head, neck, back, stomach, or other areas of your  body.  Tight muscles or clenched jaw.  Low energy or trouble sleeping. Stress may cause unhealthy behaviors, including the following:   Eating to feel better (overeating) or skipping meals.  Sleeping too little, too much, or both.  Working too much or putting off tasks (procrastination).  Smoking, drinking alcohol, or using drugs to feel better. DIAGNOSIS  Stress is diagnosed through an assessment by your health care provider. Your health care provider will ask questions about your symptoms and any stressful life events.Your health care provider will also ask about your medical history and may order blood tests or other tests. Certain medical conditions and medicine can cause physical symptoms similar to stress. Mental illness can cause emotional symptoms and unhealthy behaviors similar to stress. Your health care provider may refer you to a mental health professional for further evaluation.  TREATMENT  Stress management is the recommended treatment for stress.The goals of stress management are reducing stressful life events and coping with stress in healthy ways.  Techniques for reducing stressful life events include the following:  Stress identification. Self-monitor for stress and identify what causes stress for you. These skills may help you to avoid some stressful events.  Time management. Set your priorities, keep a calendar of events, and learn to say "no." These tools can help you avoid making too many commitments. Techniques for coping with stress include the following:  Rethinking the problem. Try to think realistically about stressful events rather than ignoring them or overreacting. Try to find the positives in a stressful situation rather than focusing on the negatives.  Exercise. Physical exercise can release both physical and emotional tension. The key is to find a form of exercise you enjoy and do it regularly.  Relaxation techniques. These relax the body and mind.  Examples include yoga, meditation, tai chi, biofeedback, deep breathing, progressive muscle relaxation, listening to music, being out in nature, journaling, and other hobbies. Again, the key is to find one or more that you enjoy and can do regularly.  Healthy lifestyle. Eat a balanced diet, get plenty of sleep, and do not smoke. Avoid using alcohol or drugs to relax.  Strong support network. Spend time with family, friends, or other people you enjoy being around.Express your feelings and talk things over with someone you trust. Counseling or talktherapy with a mental health professional may be helpful if you are having difficulty managing stress on your own. Medicine is typically not recommended for the treatment of stress.Talk to your health care provider if you think you need medicine for symptoms of stress. HOME CARE INSTRUCTIONS  Keep all follow-up visits as directed by your health care provider.  Take all medicines as directed by your health care provider. SEEK MEDICAL CARE IF:  Your symptoms get worse or you start having new symptoms.  You feel overwhelmed by your problems and can no longer manage them on your own. SEEK IMMEDIATE MEDICAL CARE IF:  You feel  like hurting yourself or someone else.   This information is not intended to replace advice given to you by your health care provider. Make sure you discuss any questions you have with your health care provider.   Document Released: 05/04/2001 Document Revised: 11/29/2014 Document Reviewed: 07/03/2013 Elsevier Interactive Patient Education [employer].

## 2015-09-27 NOTE — ED Provider Notes (Signed)
CSN: [id number]     Arrival date & time 09/27/15  1300 History   First MD Initiated Contact with Patient 09/27/15 1501     Chief Complaint  Patient presents with  . Dizziness  . Near Syncope     (Consider location/radiation/quality/duration/timing/severity/associated sxs/prior Treatment) Patient is a 44 y.o. female presenting with dizziness and near-syncope. The history is provided by the patient.  Dizziness Associated symptoms: no chest pain, no diarrhea, no headaches, no shortness of breath, no vomiting and no weakness   Near Syncope Pertinent negatives include no chest pain, no abdominal pain, no headaches and no shortness of breath.  Patient c/o a few episodes of lightheadedness the past few days. States occurs out of blue. No specific exacerbating or alleviating factors. Pt does acknowledge hx anxiety, unsure if related. Compliant w normal meds.  No new meds. Pt indicates hx intermittent headache, gradual onset, hx same, no current headache. Pt denies palpitations. No hx dysrhythmia. No hx cad, no fam hx premature cad.  States neg stress test a few years ago.  Pt indicates has been tried on cymbalta in past but states did not want to be on every day med for anxiety. Denies exertional or episodic chest pain, except notes occasional hx reflux.  Pt again brings up anxiety, and feeling as if that may be related. Denies depression.  Notes multiple stressors, busy work schedule, increased caffeine use, skipping meals, and finding relatively little relaxation time.  Denies any focal neurologic complaints including change in speech or vision, or unilateral numbness/weakness.  Occasional tingling bil finger tips.      Past Medical History  Diagnosis Date  . GERD (gastroesophageal reflux disease) 2000    come and go  . Anxiety 2012  . Panic attack 2012    situational related to move from [place].   [name] Hypertension 2008  . Bilateral anterior knee pain 2012  . Anemia 12/22/2012   Past  Surgical History  Procedure Laterality Date  . Partial hysterectomy  2009  . Laproscopic knee surgery  2011    L knee for bilateral meniscal tear. Following acute injury.   . Abdominal hysterectomy    . Cesarean section  2002   Family History  Problem Relation Age of Onset  . Diabetes Mother   . Hypertension Mother   . Hypertension Father   . Heart disease Mother 16  . Stroke Mother 40    x 2  . Alcohol abuse Mother     previous   . Drug abuse Mother     previous cocaine    Social History  Substance Use Topics  . Smoking status: Never Smoker   . Smokeless tobacco: Never Used  . Alcohol Use: No     Comment: occassional, social    OB History    Gravida Para Term Preterm AB TAB SAB Ectopic Multiple Living   4 3 3  1 1    3      Review of Systems  Constitutional: Negative for fever and chills.  HENT: Negative for sore throat.   Eyes: Negative for redness and visual disturbance.  Respiratory: Negative for cough and shortness of breath.   Cardiovascular: Positive for near-syncope. Negative for chest pain.  Gastrointestinal: Negative for vomiting, abdominal pain and diarrhea.  Genitourinary: Negative for flank pain.  Musculoskeletal: Negative for back pain and neck pain.  Skin: Negative for rash.  Neurological: Positive for dizziness. Negative for weakness, numbness and headaches.  Hematological: Does not bruise/bleed easily.  Psychiatric/Behavioral: Negative  for confusion.      Allergies  Penicillins  Home Medications   Prior to Admission medications   Medication Sig Start Date End Date Taking? Authorizing Provider  aspirin 325 MG tablet Take 325 mg by mouth daily.    Yes Historical Provider, MD  atorvastatin (LIPITOR) 20 MG tablet Take 1 tablet (20 mg total) by mouth daily at 6 PM. 11/08/14  Yes Ashly M Gottschalk, DO  diclofenac sodium (VOLTAREN) 1 % GEL Apply topically 4 (four) times daily.   Yes Historical Provider, MD  GLYXAMBI 10-5 MG TABS Take 1 tablet by  mouth daily. 09/15/15  Yes Historical Provider, MD  hydrochlorothiazide (HYDRODIURIL) 12.5 MG tablet Take 12.5 mg by mouth daily.  02/12/15  Yes Historical Provider, MD  naproxen sodium (ANAPROX) 220 MG tablet Take 220 mg by mouth 2 (two) times daily with a meal.   Yes Historical Provider, MD  Vitamin D, Ergocalciferol, (DRISDOL) 50000 UNITS CAPS capsule Take 1 capsule (50,000 Units total) by mouth every 7 (seven) days. 08/24/13  Yes Josalyn Funches, MD   BP 119/81 mmHg  Pulse 67  Temp(Src) 98.1 F (36.7 C) (Oral)  Resp 18  Ht 5\' 6"  (1.676 m)  Wt 269 lb 4.8 oz (122.154 kg)  BMI 43.49 kg/m2  SpO2 100% Physical Exam  Constitutional: She is oriented to person, place, and time. She appears well-developed and well-nourished. No distress.  HENT:  Mouth/Throat: Oropharynx is clear and moist.  Eyes: Conjunctivae are normal. No scleral icterus.  Neck: Neck supple. No tracheal deviation present. No thyromegaly present.  No bruit  Cardiovascular: Normal rate, regular rhythm, normal heart sounds and intact distal pulses.   Pulmonary/Chest: Effort normal and breath sounds normal. No respiratory distress.  Abdominal: Soft. Normal appearance and bowel sounds are normal. She exhibits no distension. There is no tenderness.  Musculoskeletal: She exhibits no edema or tenderness.  Neurological: She is alert and oriented to person, place, and time.  Motor intact intact bil. stre 5/5. Steady gait.   Skin: Skin is warm and dry. No rash noted. She is not diaphoretic.  Psychiatric:  Anxious.   Nursing note and vitals reviewed.   ED Course  Procedures (including critical care time) Labs Review  Results for orders placed or performed during the hospital encounter of 16/10/96  Basic metabolic panel  Result Value Ref Range   Sodium 139 135 - 145 mmol/L   Potassium 3.5 3.5 - 5.1 mmol/L   Chloride 105 101 - 111 mmol/L   CO2 29 22 - 32 mmol/L   Glucose, Bld 92 65 - 99 mg/dL   BUN 13 6 - 20 mg/dL    Creatinine, Ser 0.81 0.44 - 1.00 mg/dL   Calcium 9.3 8.9 - 10.3 mg/dL   GFR calc non Af Amer >60 >60 mL/min   GFR calc Af Amer >60 >60 mL/min   Anion gap 5 5 - 15  CBC  Result Value Ref Range   WBC 5.7 4.0 - 10.5 K/uL   RBC 5.41 (H) 3.87 - 5.11 MIL/uL   Hemoglobin 15.1 (H) 12.0 - 15.0 g/dL   HCT 45.1 36.0 - 46.0 %   MCV 83.4 78.0 - 100.0 fL   MCH 27.9 26.0 - 34.0 pg   MCHC 33.5 30.0 - 36.0 g/dL   RDW 13.1 11.5 - 15.5 %   Platelets 264 150 - 400 K/uL      I have personally reviewed and evaluated these lab results as part of my medical decision-making.   EKG  Interpretation   Date/Time:  Saturday September 27 2015 13:17:59 EDT Ventricular Rate:  73 PR Interval:  142 QRS Duration: 82 QT Interval:  390 QTC Calculation: 429 R Axis:   33 Text Interpretation:  Normal sinus rhythm Normal ECG No significant change  since last tracing Confirmed by Gina Leblond  MD, Lennette Bihari ([id number]) on 09/27/2015  3:05:32 PM      MDM   Iv ns. Labs. Cardiac monitoring.   Reviewed nursing notes and prior charts for additional history.   Labs unremarkable. ecg normal. Prior stress test neg. Prior mri neg.  Pt afeb. Non focal exam.   Pt currently appears stable for d/c.  rec close pcp f/u.       Lajean Saver, MD 09/27/15 [phone]

## 2015-09-27 NOTE — ED Notes (Signed)
Pt reports dizziness x 3 days. Has been to pcp for same, today had another near syncopal episode and mild discomfort to left chest and down left arm. EKG done at triage. Reports recently started on new arthritis cream, which she applied today before feeling bad.

## 2015-12-07 ENCOUNTER — Emergency Department (HOSPITAL_COMMUNITY): Payer: 59

## 2015-12-07 ENCOUNTER — Encounter (HOSPITAL_COMMUNITY): Payer: Self-pay | Admitting: Emergency Medicine

## 2015-12-07 ENCOUNTER — Emergency Department (HOSPITAL_COMMUNITY)
Admission: EM | Admit: 2015-12-07 | Discharge: 2015-12-08 | Disposition: A | Discharge status: Home / Self Care | Payer: 59 | Attending: Emergency Medicine | Admitting: Emergency Medicine

## 2015-12-07 DIAGNOSIS — Z8719 Personal history of other diseases of the digestive system: Secondary | ICD-10-CM | POA: Insufficient documentation

## 2015-12-07 DIAGNOSIS — Z7982 Long term (current) use of aspirin: Secondary | ICD-10-CM | POA: Insufficient documentation

## 2015-12-07 DIAGNOSIS — Z79899 Other long term (current) drug therapy: Secondary | ICD-10-CM | POA: Diagnosis not present

## 2015-12-07 DIAGNOSIS — M25512 Pain in left shoulder: Secondary | ICD-10-CM | POA: Diagnosis not present

## 2015-12-07 DIAGNOSIS — Z880 Allergy status to penicillin: Secondary | ICD-10-CM | POA: Diagnosis not present

## 2015-12-07 DIAGNOSIS — Z862 Personal history of diseases of the blood and blood-forming organs and certain disorders involving the immune mechanism: Secondary | ICD-10-CM | POA: Diagnosis not present

## 2015-12-07 DIAGNOSIS — I10 Essential (primary) hypertension: Secondary | ICD-10-CM | POA: Diagnosis not present

## 2015-12-07 DIAGNOSIS — M25511 Pain in right shoulder: Secondary | ICD-10-CM | POA: Diagnosis not present

## 2015-12-07 DIAGNOSIS — F410 Panic disorder [episodic paroxysmal anxiety] without agoraphobia: Secondary | ICD-10-CM | POA: Insufficient documentation

## 2015-12-07 DIAGNOSIS — R42 Dizziness and giddiness: Secondary | ICD-10-CM | POA: Diagnosis not present

## 2015-12-07 LAB — BASIC METABOLIC PANEL
Anion gap: 13 (ref 5–15)
BUN: 16 mg/dL (ref 6–20)
CALCIUM: 9.2 mg/dL (ref 8.9–10.3)
CO2: 23 mmol/L (ref 22–32)
CREATININE: 1.02 mg/dL — AB (ref 0.44–1.00)
Chloride: 103 mmol/L (ref 101–111)
GFR calc Af Amer: >60 mL/min (ref >60)
GLUCOSE: 138 mg/dL — AB (ref 65–99)
Potassium: 3.8 mmol/L (ref 3.5–5.1)
Sodium: 139 mmol/L (ref 135–145)

## 2015-12-07 LAB — URINALYSIS, ROUTINE W REFLEX MICROSCOPIC
BILIRUBIN URINE: NEGATIVE
Glucose, UA: >1000 mg/dL — AB
HGB URINE DIPSTICK: NEGATIVE
KETONES UR: NEGATIVE mg/dL
Leukocytes, UA: NEGATIVE
NITRITE: NEGATIVE
Protein, ur: NEGATIVE mg/dL
SPECIFIC GRAVITY, URINE: 1.025 (ref 1.005–1.030)
pH: 5.5 (ref 5.0–8.0)

## 2015-12-07 LAB — CBG MONITORING, ED: GLUCOSE-CAPILLARY: 129 mg/dL — AB (ref 65–99)

## 2015-12-07 LAB — CBC
HCT: 44.5 % (ref 36.0–46.0)
HEMOGLOBIN: 14.5 g/dL (ref 12.0–15.0)
MCH: 27.6 pg (ref 26.0–34.0)
MCHC: 32.6 g/dL (ref 30.0–36.0)
MCV: 84.8 fL (ref 78.0–100.0)
PLATELETS: 258 10*3/uL (ref 150–400)
RBC: 5.25 MIL/uL — ABNORMAL HIGH (ref 3.87–5.11)
RDW: 13.1 % (ref 11.5–15.5)
WBC: 7.7 10*3/uL (ref 4.0–10.5)

## 2015-12-07 LAB — URINE MICROSCOPIC-ADD ON

## 2015-12-07 LAB — TROPONIN I

## 2015-12-07 MED ORDER — SODIUM CHLORIDE 0.9 % IV BOLUS (SEPSIS)
1000.0000 mL | Freq: Once | INTRAVENOUS | Status: AC
  Administered 2015-12-07: 1000 mL via INTRAVENOUS

## 2015-12-07 MED ORDER — LORAZEPAM 2 MG/ML IJ SOLN
0.5000 mg | Freq: Once | INTRAMUSCULAR | Status: AC
  Administered 2015-12-07: 0.5 mg via INTRAVENOUS
  Filled 2015-12-07: qty 1

## 2015-12-07 NOTE — ED Provider Notes (Signed)
CSN: [id number]     Arrival date & time 12/07/15  1718 History   First MD Initiated Contact with Patient 12/07/15 2035     Chief Complaint  Patient presents with  . Dizziness  . Panic Attack     (Consider location/radiation/quality/duration/timing/severity/associated sxs/prior Treatment) HPI   Patient has a past medical history of GERD, anxiety, panic attacks, hypertension, bilateral knee pain and anemia. She is complaining of dizziness for the past few weeks that is always proceeded by eating. She reports that it does not matter what she eats that after she completes her meal and stands up she gets dizzy and has to sit down. She has also had a constant left upper sternal border pressure that has been constant for 10 days. She has had some associated bilateral trapezius tenderness. No SOB. She admits to being a hypochondriac and having severe anxiety. She has not been taking her HCTZ for the past week as she has been out of the medication. She has also tried dc'ing her Glyxambi but this did not change her symptoms. Denies any other medication changes.    Past Medical History  Diagnosis Date  . GERD (gastroesophageal reflux disease) 2000    come and go  . Anxiety 2012  . Panic attack 2012    situational related to move from [place].   [name] Hypertension 2008  . Bilateral anterior knee pain 2012  . Anemia 12/22/2012   Past Surgical History  Procedure Laterality Date  . Partial hysterectomy  2009  . Laproscopic knee surgery  2011    L knee for bilateral meniscal tear. Following acute injury.   . Abdominal hysterectomy    . Cesarean section  2002   Family History  Problem Relation Age of Onset  . Diabetes Mother   . Hypertension Mother   . Hypertension Father   . Heart disease Mother 45  . Stroke Mother 40    x 2  . Alcohol abuse Mother     previous   . Drug abuse Mother     previous cocaine    Social History  Substance Use Topics  . Smoking status: Never Smoker   .  Smokeless tobacco: Never Used  . Alcohol Use: No     Comment: occassional, social    OB History    Gravida Para Term Preterm AB TAB SAB Ectopic Multiple Living   4 3 3  1 1    3      Review of Systems  Review of Systems All other systems negative except as documented in the HPI. All pertinent positives and negatives as reviewed in the HPI.   Allergies  Penicillins  Home Medications   Prior to Admission medications   Medication Sig Start Date End Date Taking? Authorizing Provider  aspirin 325 MG tablet Take 325 mg by mouth daily.    Yes Historical Provider, MD  diclofenac sodium (VOLTAREN) 1 % GEL Apply 1 application topically 4 (four) times daily as needed (pain).    Yes Historical Provider, MD  Empagliflozin-Linagliptin (GLYXAMBI) 10-5 MG TABS Take 1 tablet by mouth daily.   Yes Historical Provider, MD  hydrochlorothiazide (HYDRODIURIL) 12.5 MG tablet Take 12.5 mg by mouth daily.  02/12/15  Yes Historical Provider, MD  LORazepam (ATIVAN) 1 MG tablet Take 1 tablet (1 mg total) by mouth 3 (three) times daily as needed for anxiety. 09/27/15  Yes Lajean Saver, MD  Naproxen-Esomeprazole (VIMOVO) 500-20 MG TBEC Take 1 tablet by mouth 2 (two) times daily as needed.  Yes Historical Provider, MD  Vitamin D, Ergocalciferol, (DRISDOL) 50000 UNITS CAPS capsule Take 1 capsule (50,000 Units total) by mouth every 7 (seven) days. Patient taking differently: Take 50,000 Units by mouth every 7 (seven) days. Wednesdays 08/24/13  Yes Josalyn Funches, MD  atorvastatin (LIPITOR) 20 MG tablet Take 1 tablet (20 mg total) by mouth daily at 6 PM. Patient not taking: Reported on 12/07/2015 11/08/14   Ashly M Gottschalk, DO   BP 133/85 mmHg  Pulse 73  Temp(Src) 97.7 F (36.5 C) (Oral)  Resp 18  Ht 5\' 6"  (1.676 m)  Wt 116.574 kg  BMI 41.50 kg/m2  SpO2 99% Physical Exam  Constitutional: She appears well-developed and well-nourished. No distress.  HENT:  Head: Normocephalic and atraumatic.  Right Ear:  Tympanic membrane and ear canal normal.  Left Ear: Tympanic membrane and ear canal normal.  Nose: Nose normal.  Mouth/Throat: Uvula is midline, oropharynx is clear and moist and mucous membranes are normal.  Eyes: Pupils are equal, round, and reactive to light.  Neck: Normal range of motion. Neck supple.  Cardiovascular: Normal rate and regular rhythm.   Pulmonary/Chest: Effort normal.  Abdominal: Soft.  No signs of abdominal distention  Musculoskeletal:  No LE swelling  Neurological: She is alert.  Acting at baseline Pt alert and oriented x 3 Upper and lower extremity strength is symmetrical and physiologic Normal muscular tone No facial droop Coordination intact, no limb ataxia Rapid alternating movements normal No pronator drift  Skin: Skin is warm and dry. No rash noted.  Nursing note and vitals reviewed.   ED Course  Procedures (including critical care time) Labs Review Labs Reviewed  BASIC METABOLIC PANEL - Abnormal; Notable for the following:    Glucose, Bld 138 (*)    Creatinine, Ser 1.02 (*)    All other components within normal limits  CBC - Abnormal; Notable for the following:    RBC 5.25 (*)    All other components within normal limits  URINALYSIS, ROUTINE W REFLEX MICROSCOPIC (NOT AT [facility]) - Abnormal; Notable for the following:    Glucose, UA >1000 (*)    All other components within normal limits  URINE MICROSCOPIC-ADD ON - Abnormal; Notable for the following:    Squamous Epithelial / LPF 0-5 (*)    Bacteria, UA FEW (*)    All other components within normal limits  CBG MONITORING, ED - Abnormal; Notable for the following:    Glucose-Capillary 129 (*)    All other components within normal limits  TROPONIN I  CBG MONITORING, ED  CBG MONITORING, ED    Imaging Review Dg Chest 2 View  12/08/2015  CLINICAL DATA:  45 year old female with chest tightness EXAM: CHEST  2 VIEW COMPARISON:  Radiograph dated 03/06/2015 FINDINGS: The heart size and mediastinal  contours are within normal limits. Both lungs are clear. The visualized skeletal structures are unremarkable. IMPRESSION: No active cardiopulmonary disease. Electronically Signed   By: Anner Crete M.D.   On: 12/08/2015 00:02   I have personally reviewed and evaluated these images and lab results as part of my medical decision-making.   EKG Interpretation None      MDM   Final diagnoses:  Dizziness    Verbal report per radiologist chest xray is negative but for a small amount of congestion. She has a negative Troponin, EKG, Orthostatics, and CBC Urinalysis shows (1,000 glucose in urine)  I discussed the case with Dr. Billy Fischer- we believe she may be having post prandial hypotension or potential  post prandial hypoglycemia. It is unclear, she was fed here and orthostatics were done without any symptoms.  My plan is to refer back to PCP and to an endocrinologist.   Delos Haring, PA-C 12/08/15 0006  Delos Haring, PA-C 12/08/15 [id number]  Gareth Morgan, MD 12/08/15 1512

## 2015-12-07 NOTE — ED Notes (Signed)
Pt reports panic attack episodes, reports dizziness, neck pain, chest pressure. Reports that this usually occurs after eating.

## 2015-12-07 NOTE — ED Notes (Signed)
Meal given to pt.

## 2015-12-07 NOTE — ED Notes (Signed)
Stood pt up to get orthostatics, pt reports no dizziness, no lightheadedness. This was after a meal was given to pt.

## 2015-12-07 NOTE — ED Notes (Signed)
Pt back from x-ray.

## 2015-12-07 NOTE — ED Notes (Signed)
Called radiology Merry Proud)  to get verbal readback for xray, stated nothing but some chest congestion.

## 2015-12-07 NOTE — ED Notes (Signed)
Pt c/o dizziness for past few days and feels like she is having a panic attack. Pt reports that dizziness occurs after she eats.

## 2015-12-07 NOTE — ED Notes (Signed)
Pt to xray

## 2015-12-08 MED ORDER — LORAZEPAM 1 MG PO TABS: 1.0000 mg | ORAL_TABLET | Freq: Three times a day (TID) | ORAL | Status: DC | PRN

## 2015-12-08 NOTE — Discharge Instructions (Signed)
WE ARE CONCERNED THAT YOUR BLOOD PRESSURE OR BLOOD SUGAR MAY BE DROPPING RIGHT AFTER YOU EAT. PLEASE CHECK YOUR SUGAR AND BLOOD PRESSURE IF YOU BECOME DIZZY AFTER A MEAL TO SEE IF THERE IS AN ASSOCIATION.  A PRIMARY CARE DOCTOR CAN MANAGE YOUR CARE IF IT IS DUE TO BLOOD PRESSURE DROPPING. IF YOUR SUGAR IS DROPPING PLEASE SEE THE ENDOCRINOLOGIST THAT I HAVE REFERRED YOU TO.  PLEASE RETURN TO THE ED FOR ANY OF THE FOLLOWING SYMPTOMS LISTED BELOW.  Dizziness Dizziness is a common problem. It is a feeling of unsteadiness or light-headedness. You may feel like you are about to faint. Dizziness can lead to injury if you stumble or fall. Anyone can become dizzy, but dizziness is more common in older adults. This condition can be caused by a number of things, including medicines, dehydration, or illness. HOME CARE INSTRUCTIONS Taking these steps may help with your condition: Eating and Drinking  Drink enough fluid to keep your urine clear or pale yellow. This helps to keep you from becoming dehydrated. Try to drink more clear fluids, such as water.  Do not drink alcohol.  Limit your caffeine intake if directed by your health care provider.  Limit your salt intake if directed by your health care provider. Activity  Avoid making quick movements.  Rise slowly from chairs and steady yourself until you feel okay.  In the morning, first sit up on the side of the bed. When you feel okay, stand slowly while you hold onto something until you know that your balance is fine.  Move your legs often if you need to stand in one place for a long time. Tighten and relax your muscles in your legs while you are standing.  Do not drive or operate heavy machinery if you feel dizzy.  Avoid bending down if you feel dizzy. Place items in your home so that they are easy for you to reach without leaning over. Lifestyle  Do not use any tobacco products, including cigarettes, chewing tobacco, or electronic  cigarettes. If you need help quitting, ask your health care provider.  Try to reduce your stress level, such as with yoga or meditation. Talk with your health care provider if you need help. General Instructions  Watch your dizziness for any changes.  Take medicines only as directed by your health care provider. Talk with your health care provider if you think that your dizziness is caused by a medicine that you are taking.  Tell a friend or a family member that you are feeling dizzy. If he or she notices any changes in your behavior, have this person call your health care provider.  Keep all follow-up visits as directed by your health care provider. This is important. SEEK MEDICAL CARE IF:  Your dizziness does not go away.  Your dizziness or light-headedness gets worse.  You feel nauseous.  You have reduced hearing.  You have new symptoms.  You are unsteady on your feet or you feel like the room is spinning. SEEK IMMEDIATE MEDICAL CARE IF:  You vomit or have diarrhea and are unable to eat or drink anything.  You have problems talking, walking, swallowing, or using your arms, hands, or legs.  You feel generally weak.  You are not thinking clearly or you have trouble forming sentences. It may take a friend or family member to notice this.  You have chest pain, abdominal pain, shortness of breath, or sweating.  Your vision changes.  You notice any bleeding.  You  have a headache.  You have neck pain or a stiff neck.  You have a fever.   This information is not intended to replace advice given to you by your health care provider. Make sure you discuss any questions you have with your health care provider.   Document Released: 05/04/2001 Document Revised: 03/25/2015 Document Reviewed: 11/04/2014 Elsevier Interactive Patient Education [employer].

## 2015-12-09 LAB — CBG MONITORING, ED: Glucose-Capillary: 131 mg/dL — ABNORMAL HIGH (ref 65–99)

## 2015-12-12 ENCOUNTER — Encounter (HOSPITAL_COMMUNITY): Payer: Self-pay | Admitting: Emergency Medicine

## 2015-12-12 ENCOUNTER — Emergency Department (HOSPITAL_COMMUNITY)
Admission: EM | Admit: 2015-12-12 | Discharge: 2015-12-13 | Disposition: A | Discharge status: Home / Self Care | Payer: 59 | Attending: Emergency Medicine | Admitting: Emergency Medicine

## 2015-12-12 ENCOUNTER — Telehealth: Payer: Self-pay | Admitting: *Deleted

## 2015-12-12 DIAGNOSIS — R6883 Chills (without fever): Secondary | ICD-10-CM | POA: Insufficient documentation

## 2015-12-12 DIAGNOSIS — I10 Essential (primary) hypertension: Secondary | ICD-10-CM | POA: Insufficient documentation

## 2015-12-12 DIAGNOSIS — Z880 Allergy status to penicillin: Secondary | ICD-10-CM | POA: Insufficient documentation

## 2015-12-12 DIAGNOSIS — Z7984 Long term (current) use of oral hypoglycemic drugs: Secondary | ICD-10-CM | POA: Diagnosis not present

## 2015-12-12 DIAGNOSIS — Z79899 Other long term (current) drug therapy: Secondary | ICD-10-CM | POA: Insufficient documentation

## 2015-12-12 DIAGNOSIS — Z862 Personal history of diseases of the blood and blood-forming organs and certain disorders involving the immune mechanism: Secondary | ICD-10-CM | POA: Insufficient documentation

## 2015-12-12 DIAGNOSIS — F419 Anxiety disorder, unspecified: Secondary | ICD-10-CM | POA: Insufficient documentation

## 2015-12-12 DIAGNOSIS — R05 Cough: Secondary | ICD-10-CM | POA: Insufficient documentation

## 2015-12-12 DIAGNOSIS — R0602 Shortness of breath: Secondary | ICD-10-CM | POA: Diagnosis not present

## 2015-12-12 DIAGNOSIS — E119 Type 2 diabetes mellitus without complications: Secondary | ICD-10-CM | POA: Insufficient documentation

## 2015-12-12 DIAGNOSIS — M542 Cervicalgia: Secondary | ICD-10-CM | POA: Insufficient documentation

## 2015-12-12 DIAGNOSIS — Z7982 Long term (current) use of aspirin: Secondary | ICD-10-CM | POA: Insufficient documentation

## 2015-12-12 DIAGNOSIS — R079 Chest pain, unspecified: Secondary | ICD-10-CM

## 2015-12-12 DIAGNOSIS — R002 Palpitations: Secondary | ICD-10-CM | POA: Diagnosis not present

## 2015-12-12 DIAGNOSIS — K219 Gastro-esophageal reflux disease without esophagitis: Secondary | ICD-10-CM | POA: Insufficient documentation

## 2015-12-12 DIAGNOSIS — E7800 Pure hypercholesterolemia, unspecified: Secondary | ICD-10-CM | POA: Insufficient documentation

## 2015-12-12 LAB — BASIC METABOLIC PANEL
Anion gap: 12 (ref 5–15)
BUN: 15 mg/dL (ref 6–20)
CO2: 25 mmol/L (ref 22–32)
CREATININE: 0.81 mg/dL (ref 0.44–1.00)
Calcium: 9.6 mg/dL (ref 8.9–10.3)
Chloride: 102 mmol/L (ref 101–111)
GFR calc Af Amer: >60 mL/min (ref >60)
Glucose, Bld: 121 mg/dL — ABNORMAL HIGH (ref 65–99)
Potassium: 3.8 mmol/L (ref 3.5–5.1)
SODIUM: 139 mmol/L (ref 135–145)

## 2015-12-12 LAB — CBC
HCT: 42.8 % (ref 36.0–46.0)
Hemoglobin: 14.2 g/dL (ref 12.0–15.0)
MCH: 27.7 pg (ref 26.0–34.0)
MCHC: 33.2 g/dL (ref 30.0–36.0)
MCV: 83.6 fL (ref 78.0–100.0)
PLATELETS: 240 10*3/uL (ref 150–400)
RBC: 5.12 MIL/uL — ABNORMAL HIGH (ref 3.87–5.11)
RDW: 13 % (ref 11.5–15.5)
WBC: 6.9 10*3/uL (ref 4.0–10.5)

## 2015-12-12 LAB — I-STAT TROPONIN, ED
TROPONIN I, POC: 0 ng/mL (ref 0.00–0.08)
TROPONIN I, POC: 0 ng/mL (ref 0.00–0.08)

## 2015-12-12 MED ORDER — ALUM & MAG HYDROXIDE-SIMETH 200-200-20 MG/5ML PO SUSP
30.0000 mL | Freq: Once | ORAL | Status: AC
  Administered 2015-12-13: 30 mL via ORAL
  Filled 2015-12-12: qty 30

## 2015-12-12 MED ORDER — GI COCKTAIL ~~LOC~~: 30.0000 mL | Freq: Once | ORAL | Status: DC

## 2015-12-12 MED ORDER — ALBUTEROL SULFATE HFA 108 (90 BASE) MCG/ACT IN AERS
2.0000 | INHALATION_SPRAY | RESPIRATORY_TRACT | Status: DC
  Administered 2015-12-13: 2 via RESPIRATORY_TRACT
  Filled 2015-12-12: qty 6.7

## 2015-12-12 NOTE — ED Notes (Addendum)
C/o intermittent episodes of sob, non-productive cough, diaphoresis, dizziness, and pressure across upper chest that radiates to back x 1 1/2 weeks.  States this episode started last night. States she was seen Sunday for same.

## 2015-12-12 NOTE — ED Notes (Signed)
Pt declines x-ray and states she just had one done on Sunday.

## 2015-12-12 NOTE — ED Provider Notes (Signed)
CSN: [id number]     Arrival date & time 12/12/15  1946 History  By signing my name below, I, Altamease Oiler, attest that this documentation has been prepared under the direction and in the presence of Jola Schmidt, MD. Electronically Signed: Altamease Oiler, ED Scribe. 12/13/2015. 12:24 AM   Chief Complaint  Patient presents with  . Shortness of Breath  . Chest Pain    The history is provided by the patient. No language interpreter was used.   Carolyn Holland is a 45 y.o. female with history of HTN, high cholesterol, DM, GERD, and anxiety who presents to the Emergency Department complaining of intermittent, burning/tight, 8/10 in severity, mid and lower chest pain with onset 2 weeks ago. The episodes of chest pain last for up to 1 hour at a time. Associated symptoms include anxiety, intermittent "tightness" at the neck, fullness in the throat after eating, SOB, dry cough, chills, and racing palpitations at night (for 2 months). Pt states that she has to sleep sitting upright in order to relieve the palpitations and anxiety.  Pt denies LE swelling. She was seen for her symptoms on 12/06/14 where she had a negative Troponin, EKG, Orthostatics, and CBC. At that time she also had dizziness that has since improved. She called the endocrinologist that she was advised to follow up with and is in the process of scheduling an appointment. Pt denies tobacco use. Her mother developed cardiac disease in her 7s.    Past Medical History  Diagnosis Date  . GERD (gastroesophageal reflux disease) 2000    come and go  . Anxiety 2012  . Panic attack 2012    situational related to move from [place].   [name] Hypertension 2008  . Bilateral anterior knee pain 2012  . Anemia 12/22/2012   Past Surgical History  Procedure Laterality Date  . Partial hysterectomy  2009  . Laproscopic knee surgery  2011    L knee for bilateral meniscal tear. Following acute injury.   . Abdominal hysterectomy    . Cesarean section   2002   Family History  Problem Relation Age of Onset  . Diabetes Mother   . Hypertension Mother   . Hypertension Father   . Heart disease Mother 74  . Stroke Mother 40    x 2  . Alcohol abuse Mother     previous   . Drug abuse Mother     previous cocaine    Social History  Substance Use Topics  . Smoking status: Never Smoker   . Smokeless tobacco: Never Used  . Alcohol Use: No     Comment: occassional, social    OB History    Gravida Para Term Preterm AB TAB SAB Ectopic Multiple Living   4 3 3  1 1    3      Review of Systems 10 Systems reviewed and all are negative for acute change except as noted in the HPI.   Allergies  Penicillins  Home Medications   Prior to Admission medications   Medication Sig Start Date End Date Taking? Authorizing Provider  aspirin 325 MG tablet Take 325 mg by mouth daily.     Historical Provider, MD  atorvastatin (LIPITOR) 20 MG tablet Take 1 tablet (20 mg total) by mouth daily at 6 PM. Patient not taking: Reported on 12/07/2015 11/08/14   Janora Norlander, DO  diclofenac sodium (VOLTAREN) 1 % GEL Apply 1 application topically 4 (four) times daily as needed (pain).     Historical  Provider, MD  Empagliflozin-Linagliptin (GLYXAMBI) 10-5 MG TABS Take 1 tablet by mouth daily.    Historical Provider, MD  hydrochlorothiazide (HYDRODIURIL) 12.5 MG tablet Take 12.5 mg by mouth daily.  02/12/15   Historical Provider, MD  LORazepam (ATIVAN) 1 MG tablet Take 1 tablet (1 mg total) by mouth 3 (three) times daily as needed for anxiety. 09/27/15   Lajean Saver, MD  LORazepam (ATIVAN) 1 MG tablet Take 1 tablet (1 mg total) by mouth 3 (three) times daily as needed for anxiety. 12/08/15   Tiffany Carlota Raspberry, PA-C  Naproxen-Esomeprazole (VIMOVO) 500-20 MG TBEC Take 1 tablet by mouth 2 (two) times daily as needed.    Historical Provider, MD  Vitamin D, Ergocalciferol, (DRISDOL) 50000 UNITS CAPS capsule Take 1 capsule (50,000 Units total) by mouth every 7 (seven)  days. Patient taking differently: Take 50,000 Units by mouth every 7 (seven) days. Wednesdays 08/24/13   Josalyn Funches, MD   BP 148/94 mmHg  Pulse 71  Temp(Src) 97.2 F (36.2 C) (Oral)  Resp 11  Ht 5\' 6"  (1.676 m)  Wt 257 lb (116.574 kg)  BMI 41.50 kg/m2  SpO2 100% Physical Exam  Constitutional: She is oriented to person, place, and time. She appears well-developed and well-nourished. No distress.  HENT:  Head: Normocephalic and atraumatic.  Eyes: EOM are normal.  Neck: Normal range of motion.  Cardiovascular: Normal rate, regular rhythm and normal heart sounds.   Pulmonary/Chest: Effort normal and breath sounds normal.  Abdominal: Soft. She exhibits no distension. There is no tenderness.  Musculoskeletal: Normal range of motion.  Neurological: She is alert and oriented to person, place, and time.  Skin: Skin is warm and dry.  Psychiatric: She has a normal mood and affect. Judgment normal.  Nursing note and vitals reviewed.   ED Course  Procedures (including critical care time) DIAGNOSTIC STUDIES: Oxygen Saturation is 100% on RA,  normal by my interpretation.    COORDINATION OF CARE: 11:17 PM Discussed treatment plan which includes lab work, EKG, albuterol, and Maalox with pt at bedside and pt agreed to plan.  Labs Review Labs Reviewed  BASIC METABOLIC PANEL - Abnormal; Notable for the following:    Glucose, Bld 121 (*)    All other components within normal limits  CBC - Abnormal; Notable for the following:    RBC 5.12 (*)    All other components within normal limits  D-DIMER, QUANTITATIVE (NOT AT [facility])  TSH  I-STAT TROPOININ, ED  Randolm Idol, ED    Imaging Review Dg Chest 2 View  12/13/2015  CLINICAL DATA:  Acute onset of intermittent shortness of breath and nonproductive cough. Diaphoresis, dizziness and upper chest pressure, radiating to the back. Initial encounter. EXAM: CHEST  2 VIEW COMPARISON:  Chest radiograph performed 12/07/2015 FINDINGS: The lungs  are well-aerated. Mild vascular congestion is noted. There is no evidence of focal opacification, pleural effusion or pneumothorax. The heart is normal in size; the mediastinal contour is within normal limits. No acute osseous abnormalities are seen. IMPRESSION: Mild vascular congestion noted.  Lungs remain grossly clear. Electronically Signed   By: Garald Balding M.D.   On: 12/13/2015 01:27   I have personally reviewed and evaluated these lab results as part of my medical decision-making.  EKG Interpretation #1 Date/Time:  Friday December 12 2015 19:55:36 EST Ventricular Rate:  73 PR Interval:  170 QRS Duration: 82 QT Interval:  370 QTC Calculation: 407 R Axis:   69 Text Interpretation:  Normal sinus rhythm Normal ECG No  significant change  was found Confirmed by Nilda Keathley  MD, Idona Stach ([id number]) on 12/12/2015 11:14:47 PM    EKG Interpretation #2  Date/Time:  Saturday December 13 2015 00:17:17 EST Ventricular Rate:  62 PR Interval:  145 QRS Duration: 89 QT Interval:  401 QTC Calculation: 407 R Axis:   35 Text Interpretation:  Sinus rhythm No significant change was found Confirmed by Donta Mcinroy  MD, Deserea Bordley ([id number]) on 12/13/2015 12:51:04 AM          MDM   Final diagnoses:  Chest pain, unspecified chest pain type    Patient is overall well-appearing.  There are both typical and atypical components to her chest pain.  Troponin and EKG 2 are both without ischemic changes in the emergency department.  Troponin levels are normal.  She'll be referred to cardiology as outpatient for possible outpatient stress testing.  She understands to return to the ER for new or worsening symptoms  I personally performed the services described in this documentation, which was scribed in my presence. The recorded information has been reviewed and is accurate.       Jola Schmidt, MD 12/14/15 [id number]

## 2015-12-12 NOTE — Telephone Encounter (Signed)
Pt called stating MDs she was referred to never received referral from ED.  ERCM reviewed chart to find that referrals were sent via [facility].  ERCM pushed resend on each referral. Notified pt.

## 2015-12-13 ENCOUNTER — Emergency Department (HOSPITAL_COMMUNITY): Payer: 59

## 2015-12-13 LAB — TSH: TSH: 1.031 u[IU]/mL (ref 0.350–4.500)

## 2015-12-13 LAB — D-DIMER, QUANTITATIVE: D-Dimer, Quant: <0.27 ug/mL-FEU (ref 0.00–0.50)

## 2015-12-13 NOTE — Discharge Instructions (Signed)
Nonspecific Chest Pain  °Chest pain can be caused by many different conditions. There is always a chance that your pain could be related to something serious, such as a heart attack or a blood clot in your lungs. Chest pain can also be caused by conditions that are not life-threatening. If you have chest pain, it is very important to follow up with your health care provider. °CAUSES  °Chest pain can be caused by: °· Heartburn. °· Pneumonia or bronchitis. °· Anxiety or stress. °· Inflammation around your heart (pericarditis) or lung (pleuritis or pleurisy). °· A blood clot in your lung. °· A collapsed lung (pneumothorax). It can develop suddenly on its own (spontaneous pneumothorax) or from trauma to the chest. °· Shingles infection (varicella-zoster virus). °· Heart attack. °· Damage to the bones, muscles, and cartilage that make up your chest wall. This can include: °¨ Bruised bones due to injury. °¨ Strained muscles or cartilage due to frequent or repeated coughing or overwork. °¨ Fracture to one or more ribs. °¨ Sore cartilage due to inflammation (costochondritis). °RISK FACTORS  °Risk factors for chest pain may include: °· Activities that increase your risk for trauma or injury to your chest. °· Respiratory infections or conditions that cause frequent coughing. °· Medical conditions or overeating that can cause heartburn. °· Heart disease or family history of heart disease. °· Conditions or health behaviors that increase your risk of developing a blood clot. °· Having had chicken pox (varicella zoster). °SIGNS AND SYMPTOMS °Chest pain can feel like: °· Burning or tingling on the surface of your chest or deep in your chest. °· Crushing, pressure, aching, or squeezing pain. °· Dull or sharp pain that is worse when you move, cough, or take a deep breath. °· Pain that is also felt in your back, neck, shoulder, or arm, or pain that spreads to any of these areas. °Your chest pain may come and go, or it may stay  constant. °DIAGNOSIS °Lab tests or other studies may be needed to find the cause of your pain. Your health care provider may have you take a test called an ambulatory ECG (electrocardiogram). An ECG records your heartbeat patterns at the time the test is performed. You may also have other tests, such as: °· Transthoracic echocardiogram (TTE). During echocardiography, sound waves are used to create a picture of all of the heart structures and to look at how blood flows through your heart. °· Transesophageal echocardiogram (TEE). This is a more advanced imaging test that obtains images from inside your body. It allows your health care provider to see your heart in finer detail. °· Cardiac monitoring. This allows your health care provider to monitor your heart rate and rhythm in real time. °· Holter monitor. This is a portable device that records your heartbeat and can help to diagnose abnormal heartbeats. It allows your health care provider to track your heart activity for several days, if needed. °· Stress tests. These can be done through exercise or by taking medicine that makes your heart beat more quickly. °· Blood tests. °· Imaging tests. °TREATMENT  °Your treatment depends on what is causing your chest pain. Treatment may include: °· Medicines. These may include: °¨ Acid blockers for heartburn. °¨ Anti-inflammatory medicine. °¨ Pain medicine for inflammatory conditions. °¨ Antibiotic medicine, if an infection is present. °¨ Medicines to dissolve blood clots. °¨ Medicines to treat coronary artery disease. °· Supportive care for conditions that do not require medicines. This may include: °¨ Resting. °¨ Applying heat   or cold packs to injured areas. °¨ Limiting activities until pain decreases. °HOME CARE INSTRUCTIONS °· If you were prescribed an antibiotic medicine, finish it all even if you start to feel better. °· Avoid any activities that bring on chest pain. °· Do not use any tobacco products, including  cigarettes, chewing tobacco, or electronic cigarettes. If you need help quitting, ask your health care provider. °· Do not drink alcohol. °· Take medicines only as directed by your health care provider. °· Keep all follow-up visits as directed by your health care provider. This is important. This includes any further testing if your chest pain does not go away. °· If heartburn is the cause for your chest pain, you may be told to keep your head raised (elevated) while sleeping. This reduces the chance that acid will go from your stomach into your esophagus. °· Make lifestyle changes as directed by your health care provider. These may include: °¨ Getting regular exercise. Ask your health care provider to suggest some activities that are safe for you. °¨ Eating a heart-healthy diet. A registered dietitian can help you to learn healthy eating options. °¨ Maintaining a healthy weight. °¨ Managing diabetes, if necessary. °¨ Reducing stress. °SEEK MEDICAL CARE IF: °· Your chest pain does not go away after treatment. °· You have a rash with blisters on your chest. °· You have a fever. °SEEK IMMEDIATE MEDICAL CARE IF:  °· Your chest pain is worse. °· You have an increasing cough, or you cough up blood. °· You have severe abdominal pain. °· You have severe weakness. °· You faint. °· You have chills. °· You have sudden, unexplained chest discomfort. °· You have sudden, unexplained discomfort in your arms, back, neck, or jaw. °· You have shortness of breath at any time. °· You suddenly start to sweat, or your skin gets clammy. °· You feel nauseous or you vomit. °· You suddenly feel light-headed or dizzy. °· Your heart begins to beat quickly, or it feels like it is skipping beats. °These symptoms may represent a serious problem that is an emergency. Do not wait to see if the symptoms will go away. Get medical help right away. Call your local emergency services (911 in the [place] Do not drive yourself to the hospital. °  °This  information is not intended to replace advice given to you by your health care provider. Make sure you discuss any questions you have with your health care provider. °  °Document Released: 08/18/2005 Document Revised: 11/29/2014 Document Reviewed: 06/14/2014 °Elsevier Interactive Patient Education ©2016 Elsevier Inc. ° °

## 2016-02-12 ENCOUNTER — Encounter: Payer: Self-pay | Admitting: Internal Medicine

## 2016-02-12 ENCOUNTER — Ambulatory Visit (INDEPENDENT_AMBULATORY_CARE_PROVIDER_SITE_OTHER): Payer: 59 | Admitting: Internal Medicine

## 2016-02-12 ENCOUNTER — Other Ambulatory Visit (INDEPENDENT_AMBULATORY_CARE_PROVIDER_SITE_OTHER): Payer: 59

## 2016-02-12 VITALS — BP 122/88 | HR 92 | Temp 99.5°F | Resp 18 | Ht 66.0 in | Wt 262.0 lb

## 2016-02-12 DIAGNOSIS — E1169 Type 2 diabetes mellitus with other specified complication: Secondary | ICD-10-CM

## 2016-02-12 DIAGNOSIS — E119 Type 2 diabetes mellitus without complications: Secondary | ICD-10-CM

## 2016-02-12 DIAGNOSIS — Z1239 Encounter for other screening for malignant neoplasm of breast: Secondary | ICD-10-CM

## 2016-02-12 DIAGNOSIS — E669 Obesity, unspecified: Secondary | ICD-10-CM

## 2016-02-12 DIAGNOSIS — G4733 Obstructive sleep apnea (adult) (pediatric): Secondary | ICD-10-CM | POA: Diagnosis not present

## 2016-02-12 DIAGNOSIS — R0789 Other chest pain: Secondary | ICD-10-CM

## 2016-02-12 DIAGNOSIS — I10 Essential (primary) hypertension: Secondary | ICD-10-CM

## 2016-02-12 LAB — COMPREHENSIVE METABOLIC PANEL
ALT: 14 U/L (ref 0–35)
AST: 15 U/L (ref 0–37)
Albumin: 3.8 g/dL (ref 3.5–5.2)
Alkaline Phosphatase: 59 U/L (ref 39–117)
BUN: 14 mg/dL (ref 6–23)
CALCIUM: 9.6 mg/dL (ref 8.4–10.5)
CHLORIDE: 104 meq/L (ref 96–112)
CO2: 32 meq/L (ref 19–32)
CREATININE: 0.77 mg/dL (ref 0.40–1.20)
GFR: 104 mL/min (ref >60.00)
Glucose, Bld: 132 mg/dL — ABNORMAL HIGH (ref 70–99)
Potassium: 4.1 mEq/L (ref 3.5–5.1)
Sodium: 141 mEq/L (ref 135–145)
Total Bilirubin: 1.2 mg/dL (ref 0.2–1.2)
Total Protein: 7.2 g/dL (ref 6.0–8.3)

## 2016-02-12 LAB — LIPID PANEL
CHOL/HDL RATIO: 3
Cholesterol: 200 mg/dL (ref 0–200)
HDL: 68.9 mg/dL (ref >39.00)
LDL CALC: 120 mg/dL — AB (ref 0–99)
NonHDL: 131.02
TRIGLYCERIDES: 54 mg/dL (ref 0.0–149.0)
VLDL: 10.8 mg/dL (ref 0.0–40.0)

## 2016-02-12 LAB — MICROALBUMIN / CREATININE URINE RATIO
CREATININE, U: 146.2 mg/dL
MICROALB/CREAT RATIO: 0.5 mg/g (ref 0.0–30.0)
Microalb, Ur: <0.7 mg/dL (ref 0.0–1.9)

## 2016-02-12 LAB — CBC
HCT: 42 % (ref 36.0–46.0)
Hemoglobin: 14.1 g/dL (ref 12.0–15.0)
MCHC: 33.5 g/dL (ref 30.0–36.0)
MCV: 82.2 fl (ref 78.0–100.0)
PLATELETS: 174 10*3/uL (ref 150.0–400.0)
RBC: 5.11 Mil/uL (ref 3.87–5.11)
RDW: 14.1 % (ref 11.5–15.5)
WBC: 4.4 10*3/uL (ref 4.0–10.5)

## 2016-02-12 LAB — HEMOGLOBIN A1C: Hgb A1c MFr Bld: 7 % — ABNORMAL HIGH (ref 4.6–6.5)

## 2016-02-12 NOTE — Patient Instructions (Signed)
We are checking the labs today and can make adjustments as needed to the medicines. We will work on finding something that your insurance will approve.   We are sending you to the nutritionist and the diabetes specialist.   Diabetes and Standards of Medical Care Diabetes is complicated. You may find that your diabetes team includes a dietitian, nurse, diabetes educator, eye doctor, and more. To help everyone know what is going on and to help you get the care you deserve, the following schedule of care was developed to help keep you on track. Below are the tests, exams, vaccines, medicines, education, and plans you will need. HbA1c test This test shows how well you have controlled your glucose over the past 2-3 months. It is used to see if your diabetes management plan needs to be adjusted.   It is performed at least 2 times a year if you are meeting treatment goals.  It is performed 4 times a year if therapy has changed or if you are not meeting treatment goals. Blood pressure test  This test is performed at every routine medical visit. The goal is less than 140/90 mm Hg for most people, but 130/80 mm Hg in some cases. Ask your health care provider about your goal. Dental exam  Follow up with the dentist regularly. Eye exam  If you are diagnosed with type 1 diabetes as a child, get an exam upon reaching the age of 10 years or older and having had diabetes for 3-5 years. Yearly eye exams are recommended after that initial eye exam.  If you are diagnosed with type 1 diabetes as an adult, get an exam within 5 years of diagnosis and then yearly.  If you are diagnosed with type 2 diabetes, get an exam as soon as possible after the diagnosis and then yearly. Foot care exam  Visual foot exams are performed at every routine medical visit. The exams check for cuts, injuries, or other problems with the feet.  You should have a complete foot exam performed every year. This exam includes an  inspection of the structure and skin of your feet, a check of the pulses in your feet, and a check of the sensation in your feet.  Type 1 diabetes: The first exam is performed 5 years after diagnosis.  Type 2 diabetes: The first exam is performed at the time of diagnosis.  Check your feet nightly for cuts, injuries, or other problems with your feet. Tell your health care provider if anything is not healing. Kidney function test (urine microalbumin)  This test is performed once a year.  Type 1 diabetes: The first test is performed 5 years after diagnosis.  Type 2 diabetes: The first test is performed at the time of diagnosis.  A serum creatinine and estimated glomerular filtration rate (eGFR) test is done once a year to assess the level of chronic kidney disease (CKD), if present. Lipid profile (cholesterol, HDL, LDL, triglycerides)  Performed every 5 years for most people.  The goal for LDL is less than 100 mg/dL. If you are at high risk, the goal is less than 70 mg/dL.  The goal for HDL is 40 mg/dL-50 mg/dL for men and 50 mg/dL-60 mg/dL for women. An HDL cholesterol of 60 mg/dL or higher gives some protection against heart disease.  The goal for triglycerides is less than 150 mg/dL. Immunizations  The flu (influenza) vaccine is recommended yearly for every person 6 months of age or older who has diabetes.    The pneumonia (pneumococcal) vaccine is recommended for every person 2 years of age or older who has diabetes. Adults 65 years of age or older may receive the pneumonia vaccine as a series of two separate shots.  The hepatitis B vaccine is recommended for adults shortly after they have been diagnosed with diabetes.  The Tdap (tetanus, diphtheria, and pertussis) vaccine should be given:  According to normal childhood vaccination schedules, for children.  Every 10 years, for adults who have diabetes. Diabetes self-management education  Education is recommended at diagnosis  and ongoing as needed. Treatment plan  Your treatment plan is reviewed at every medical visit.   This information is not intended to replace advice given to you by your health care provider. Make sure you discuss any questions you have with your health care provider.   Document Released: 09/05/2009 Document Revised: 11/29/2014 Document Reviewed: 04/10/2013 Elsevier Interactive Patient Education 2016 Elsevier Inc.  

## 2016-02-12 NOTE — Progress Notes (Signed)
Pre visit review using our clinic review tool, if applicable. No additional management support is needed unless otherwise documented below in the visit note. 

## 2016-02-13 ENCOUNTER — Other Ambulatory Visit: Payer: Self-pay | Admitting: Internal Medicine

## 2016-02-13 MED ORDER — ROSUVASTATIN CALCIUM 10 MG PO TABS: 10.0000 mg | ORAL_TABLET | Freq: Every day | ORAL | Status: DC

## 2016-02-13 NOTE — Assessment & Plan Note (Signed)
Taking metformin, will refer to nutritionist to help her get some knowledge about food and the things that are not good. She also needs to exercise and I talked to her about starting an exercise regimen.

## 2016-02-13 NOTE — Assessment & Plan Note (Signed)
Referral to cardiology although she is not having the chest pains now, she does have intermittent palpitations.

## 2016-02-13 NOTE — Progress Notes (Signed)
   Subjective:    Patient ID: Carolyn Holland, female    DOB: Sep 04, 1971, 45 y.o.   MRN: [id number]  HPI The patient is a 45 YO female coming in new for several concerns including her diabetes (taking metformin, previous was on glxambi and did well but insurance did not cover, never been on insulin, diagnosed several years ago, no complications that she knows of, not on ACE-I or ARB), her blood pressure (previously was on an old medication and was switched to hctz and is doing okay with it but resents that she was switched, denies headaches or chest pains, not known to be complicated), and her history of palpitations (she states that several years ago she was having palpitations/atypical chest pain and told to see a cardiologist and never did, she would like to be referred, has had palpitations several times since then, no personal CAD history but family history of CAD, non-smoker). She is not happy with her previous provider since she kept making changes and she did not feel like they were listening. She does not exercise and is not sure about her dietary stuff. She has never talked to a nutritionist. She denies numbness or chest pains.   PMH, [facility], social history reviewed and updated.   Review of Systems  Constitutional: Positive for activity change. Negative for fever, appetite change, fatigue and unexpected weight change.       Not exercising  HENT: Negative.   Eyes: Negative.   Respiratory: Negative for cough, chest tightness, shortness of breath and wheezing.   Cardiovascular: Positive for palpitations. Negative for chest pain and leg swelling.  Gastrointestinal: Negative for nausea, abdominal pain, diarrhea, constipation and abdominal distention.  Musculoskeletal: Negative for myalgias, back pain and arthralgias.  Skin: Negative.   Neurological: Negative for dizziness, weakness, light-headedness and headaches.  Psychiatric/Behavioral: Negative.       Objective:   Physical Exam    Constitutional: She is oriented to person, place, and time. She appears well-developed and well-nourished.  Morbidly obese  HENT:  Head: Normocephalic and atraumatic.  Eyes: EOM are normal.  Neck: Normal range of motion.  Cardiovascular: Normal rate and regular rhythm.   Pulmonary/Chest: Effort normal and breath sounds normal. No respiratory distress. She has no wheezes. She has no rales.  Abdominal: Soft. Bowel sounds are normal. She exhibits no distension. There is no tenderness. There is no rebound.  Musculoskeletal: She exhibits no edema.  Neurological: She is alert and oriented to person, place, and time. Coordination normal.  Skin: Skin is warm and dry.  Psychiatric: She has a normal mood and affect.   Filed Vitals:   02/12/16 0919  BP: 122/88  Pulse: 92  Temp: 99.5 F (37.5 C)  TempSrc: Oral  Resp: 18  Height: 5\' 6"  (1.676 m)  Weight: 262 lb (118.842 kg)  SpO2: 99%      Assessment & Plan:

## 2016-02-13 NOTE — Assessment & Plan Note (Signed)
BP at goal on her hctz 12.5 mg daily. Checking CMP and adjust as needed. She is not on ACE-I or ARB and would be the next to add if needed given her diabetes.

## 2016-02-13 NOTE — Assessment & Plan Note (Signed)
She does not like CPAP and refuses to use.

## 2016-02-13 NOTE — Assessment & Plan Note (Signed)
Not known to be complicated, checking Q000111Q and lipid panel. Not on statin due to reaction with lipitor stopped taking that. She is taking metformin 500 mg daily, wants to see an endocrinologist for a second opinion on whether she is type 2 or 1. I advised her that my guess was that she was type 2. She is not on ACE-I or ARB, checking microalbumin to creatinine ratio.

## 2016-02-16 ENCOUNTER — Telehealth: Payer: Self-pay | Admitting: Internal Medicine

## 2016-02-16 NOTE — Telephone Encounter (Signed)
Patient is requesting refill on hydrochlorothiazide to be sent to [facility] on [address].

## 2016-02-17 ENCOUNTER — Other Ambulatory Visit: Payer: Self-pay | Admitting: Geriatric Medicine

## 2016-02-17 MED ORDER — HYDROCHLOROTHIAZIDE 12.5 MG PO TABS: 12.5000 mg | ORAL_TABLET | Freq: Every day | ORAL | Status: DC

## 2016-02-17 NOTE — Telephone Encounter (Signed)
Sent to pharmacy 

## 2016-02-27 ENCOUNTER — Ambulatory Visit
Admission: RE | Admit: 2016-02-27 | Discharge: 2016-02-27 | Disposition: A | Discharge status: Home / Self Care | Payer: 59 | Source: Ambulatory Visit | Attending: Internal Medicine | Admitting: Internal Medicine

## 2016-02-27 ENCOUNTER — Telehealth: Payer: Self-pay

## 2016-02-27 ENCOUNTER — Other Ambulatory Visit: Payer: Self-pay

## 2016-02-27 ENCOUNTER — Encounter: Payer: Self-pay | Admitting: Internal Medicine

## 2016-02-27 ENCOUNTER — Ambulatory Visit (INDEPENDENT_AMBULATORY_CARE_PROVIDER_SITE_OTHER): Payer: 59 | Admitting: Internal Medicine

## 2016-02-27 VITALS — BP 130/86 | HR 85 | Ht 66.0 in | Wt 266.8 lb

## 2016-02-27 DIAGNOSIS — Z8639 Personal history of other endocrine, nutritional and metabolic disease: Secondary | ICD-10-CM | POA: Diagnosis not present

## 2016-02-27 DIAGNOSIS — R079 Chest pain, unspecified: Secondary | ICD-10-CM | POA: Diagnosis not present

## 2016-02-27 DIAGNOSIS — I10 Essential (primary) hypertension: Secondary | ICD-10-CM | POA: Diagnosis not present

## 2016-02-27 DIAGNOSIS — R0789 Other chest pain: Secondary | ICD-10-CM | POA: Diagnosis not present

## 2016-02-27 DIAGNOSIS — Z1239 Encounter for other screening for malignant neoplasm of breast: Secondary | ICD-10-CM

## 2016-02-27 MED ORDER — METFORMIN HCL ER 500 MG PO TB24: 500.0000 mg | ORAL_TABLET | Freq: Every day | ORAL | Status: DC

## 2016-02-27 MED ORDER — METFORMIN HCL ER (MOD) 500 MG PO TB24: 500.0000 mg | ORAL_TABLET | Freq: Every day | ORAL | Status: DC

## 2016-02-27 NOTE — Telephone Encounter (Signed)
PA initiated via [facility] [facility]

## 2016-02-27 NOTE — Telephone Encounter (Signed)
Pt called to check up on this request, pt was wondering what else she can do because she is out of this med. Please call pt back

## 2016-02-27 NOTE — Telephone Encounter (Signed)
Patient called and is out of her metformin. She said you have to call [phone] (optum) to get her mediation authorized. She says it is urgent because she needs her medication today. The pharmacy has already given her a few pills to hold her over.

## 2016-02-27 NOTE — Patient Instructions (Signed)
Your physician has requested that you have an exercise stress myoview. For further information please visit [email or web address]. Please follow instruction sheet, as given.  Your physician recommends that you schedule a follow-up appointment after your test.   

## 2016-02-27 NOTE — Telephone Encounter (Signed)
PA required on Metformin (Glumetza) MOD, previous Metformin XR prescription does not require PA.  Metformin XR authorized to be refilled per Terri Piedra FNP. Pt and pharmacy advised of same

## 2016-02-27 NOTE — Progress Notes (Signed)
OFFICE NOTE  Chief Complaint:  Chest pain  Primary Care Physician: Hoyt Koch, MD  HPI:  Carolyn Holland is a 45 y.o. female who recently presented: Emergency department with chest pain. She reports is been having palpitations and associated anxiety. She wakes up at night with heart racing. On the days she went to the emergency room she developed a sharp and burning quality chest pain over the left anterior chest which she could easily point to. It was not worse with exertion or relieved by rest. It did not radiate. Workup was negative for ischemia. Since then she's been significantly concerned about coronary disease. She said she had a cardiac workup 2 years ago at [facility] in [place]. The testing was reportedly negative however she was told that she did have a small amount of coronary artery disease. It's not clear how this diagnosis was made. We will be requesting records. She does have a number of cardiac risk factors including hypertension, dyslipidemia and recent diabetes along with morbid obesity. Apparently she had a sleep study in 2011 which was a home study and suggested possible sleep apnea, however she had just gotten out of the hospital. She had a repeat sleep study recently in [place] which was negative. She was recently prescribed Lipitor but had side effects with that medicine and has not taken it. Her LDL cholesterol is 120. Her hemoglobin A1c is 7. She's been referred to an endocrinologist who she will be seeing shortly.  PMHx:  Past Medical History  Diagnosis Date  . GERD (gastroesophageal reflux disease) 2000    come and go  . Anxiety 2012  . Panic attack 2012    situational related to move from [place].   [name] Hypertension 2008  . Bilateral anterior knee pain 2012  . Anemia 12/22/2012  . Hyperlipidemia   . Diabetes mellitus without complication [facility])     Past Surgical History  Procedure Laterality Date  . Partial hysterectomy  2009  .  Laproscopic knee surgery  2011    L knee for bilateral meniscal tear. Following acute injury.   . Abdominal hysterectomy    . Cesarean section  2002    FAMHx:  Family History  Problem Relation Age of Onset  . Diabetes Mother   . Hypertension Mother   . Hypertension Father   . Heart disease Mother 19  . Stroke Mother 40    x 2  . Alcohol abuse Mother     previous   . Drug abuse Mother     previous cocaine     SOCHx:   reports that she has never smoked. She has never used smokeless tobacco. She reports that she does not drink alcohol or use illicit drugs.  ALLERGIES:  Allergies  Allergen Reactions  . Penicillins Hives    Has patient had a PCN reaction causing immediate rash, facial/tongue/throat swelling, SOB or lightheadedness with hypotension: Yes Has patient had a PCN reaction causing severe rash involving mucus membranes or skin necrosis: No Has patient had a PCN reaction that required hospitalization emergency room visit but did not stay Has patient had a PCN reaction occurring within the last 10 years: No If all of the above answers are "NO", then may proceed with Cephalosporin use.    ROS: Pertinent items noted in HPI and remainder of comprehensive ROS otherwise negative.  HOME MEDS: Current Outpatient Prescriptions  Medication Sig Dispense Refill  . aspirin 325 MG tablet Take 325 mg by mouth daily.     [name]  diclofenac sodium (VOLTAREN) 1 % GEL Apply 1 application topically 4 (four) times daily as needed (pain).     . hydrochlorothiazide (HYDRODIURIL) 12.5 MG tablet Take 1 tablet (12.5 mg total) by mouth daily. 30 tablet 3  . Naproxen Sodium (ALEVE PO) Take 1 tablet by mouth as needed.    . metFORMIN (GLUCOPHAGE-XR) 500 MG 24 hr tablet Take 1 tablet (500 mg total) by mouth daily with breakfast. 30 tablet 5   No current facility-administered medications for this visit.    LABS/IMAGING: No results found for this or any previous visit (from the past 48 hour(s)). No  results found.  WEIGHTS: Wt Readings from Last 3 Encounters:  02/27/16 266 lb 12.8 oz (121.02 kg)  02/12/16 262 lb (118.842 kg)  12/12/15 257 lb (116.574 kg)    VITALS: BP 130/86 mmHg  Pulse 85  Ht 5\' 6"  (1.676 m)  Wt 266 lb 12.8 oz (121.02 kg)  BMI 43.08 kg/m2  EXAM: General appearance: alert and no distress Neck: no carotid bruit and no JVD Lungs: clear to auscultation bilaterally Heart: regular rate and rhythm, S1, S2 normal, no murmur, click, rub or gallop Abdomen: soft, non-tender; bowel sounds normal; no masses,  no organomegaly Extremities: extremities normal, atraumatic, no cyanosis or edema Pulses: 2+ and symmetric Skin: Skin color, texture, turgor normal. No rashes or lesions Neurologic: Grossly normal Psych: Mildly anxious, very animated an energetic  EKG: Normal sinus rhythm at 85  ASSESSMENT: 1. Chest pain, somewhat atypical for cardiac pain 2. Hypertension 3. Dyslipidemia 4. Type 2 diabetes 5. Family history of premature coronary disease in her mother 30. Morbid obesity 7. Palpitations  PLAN: 1.   Ms. Curvin presents for evaluation of chest pain. She ruled out for MI in the emergency department. She has a number of cardiovascular risk factors. She tells me that she had stress testing likely 3 years ago at [facility] in [facility] which was negative. She's also had stress testing in the past when she was in [place]. Given her coronary risk factors I like her to have another stress test. If that's negative it should be reassuring. Also she will likely need treatment for dyslipidemia if she is found to have coronary disease. I like to review her outside stress testing records. Especially given her diabetes, I would recommend at least a 50% reduction in LDL cholesterol. If she is intolerant of Lipitor, we need to consider another statin. Apparently she was written for Crestor but this cost her more than $200 a month. Finally, I agree with her  being evaluated by an endocrinologist that she needs better blood sugar control. She asked about weight loss medications and I advised her that I would steer away from phentermine based on the fact that it may worsen her palpitations. She may want to consider Contrave, either from her primary care provider or endocrinologist if they prescribe such medicines. I typically do not.  Thanks again for the kind referral.  Pixie Casino, MD, [facility] Attending Cardiologist [place] 02/27/2016, 2:16 PM

## 2016-03-01 ENCOUNTER — Telehealth (HOSPITAL_COMMUNITY): Payer: Self-pay | Admitting: *Deleted

## 2016-03-01 NOTE — Telephone Encounter (Signed)
Patient given detailed instructions per Myocardial Perfusion Study Information Sheet for the test on 03/03/16 at 1330. Patient notified to arrive 15 minutes early and that it is imperative to arrive on time for appointment to keep from having the test rescheduled.  If you need to cancel or reschedule your appointment, please call the office within 24 hours of your appointment. Failure to do so may result in a cancellation of your appointment, and a $50 no show fee. Patient verbalized understanding.Chameka Mcmullen, Ranae Palms

## 2016-03-03 ENCOUNTER — Ambulatory Visit (HOSPITAL_COMMUNITY): Payer: 59 | Attending: Cardiovascular Disease

## 2016-03-03 DIAGNOSIS — R079 Chest pain, unspecified: Secondary | ICD-10-CM | POA: Diagnosis present

## 2016-03-03 DIAGNOSIS — I10 Essential (primary) hypertension: Secondary | ICD-10-CM

## 2016-03-03 DIAGNOSIS — I779 Disorder of arteries and arterioles, unspecified: Secondary | ICD-10-CM | POA: Diagnosis not present

## 2016-03-03 DIAGNOSIS — Z8639 Personal history of other endocrine, nutritional and metabolic disease: Secondary | ICD-10-CM | POA: Insufficient documentation

## 2016-03-03 DIAGNOSIS — R002 Palpitations: Secondary | ICD-10-CM | POA: Diagnosis not present

## 2016-03-03 DIAGNOSIS — E119 Type 2 diabetes mellitus without complications: Secondary | ICD-10-CM | POA: Diagnosis not present

## 2016-03-03 MED ORDER — TECHNETIUM TC 99M SESTAMIBI GENERIC - CARDIOLITE
33.0000 | Freq: Once | INTRAVENOUS | Status: AC | PRN
  Administered 2016-03-03: 33 via INTRAVENOUS

## 2016-03-04 ENCOUNTER — Ambulatory Visit (HOSPITAL_COMMUNITY): Payer: 59 | Attending: Cardiovascular Disease

## 2016-03-04 LAB — MYOCARDIAL PERFUSION IMAGING
CHL CUP NUCLEAR SDS: 0
CHL CUP NUCLEAR SRS: 1
CHL CUP NUCLEAR SSS: 1
CSEPED: 7 min
CSEPPHR: 162 {beats}/min
Estimated workload: 8.5 METS
Exercise duration (sec): 0 s
LHR: 0.29
LV sys vol: 59 mL
LVDIAVOL: 114 mL (ref 46–106)
MPHR: 175 {beats}/min
Percent HR: 92 %
Rest HR: 89 {beats}/min
TID: 0.94

## 2016-03-04 MED ORDER — TECHNETIUM TC 99M SESTAMIBI GENERIC - CARDIOLITE
31.8000 | Freq: Once | INTRAVENOUS | Status: AC | PRN
  Administered 2016-03-04: 31.8 via INTRAVENOUS

## 2016-03-19 ENCOUNTER — Ambulatory Visit: Payer: 59 | Admitting: Skilled Nursing Facility1

## 2016-03-26 ENCOUNTER — Ambulatory Visit: Payer: 59 | Admitting: Internal Medicine

## 2016-04-16 ENCOUNTER — Ambulatory Visit: Payer: 59 | Admitting: Dietician

## 2016-05-14 ENCOUNTER — Ambulatory Visit: Payer: 59 | Admitting: Internal Medicine

## 2016-05-14 DIAGNOSIS — Z0289 Encounter for other administrative examinations: Secondary | ICD-10-CM

## 2016-06-19 ENCOUNTER — Encounter (HOSPITAL_COMMUNITY): Payer: Self-pay | Admitting: Emergency Medicine

## 2016-06-19 ENCOUNTER — Emergency Department (HOSPITAL_COMMUNITY)
Admission: EM | Admit: 2016-06-19 | Discharge: 2016-06-19 | Disposition: A | Discharge status: Home / Self Care | Payer: 59 | Attending: Emergency Medicine | Admitting: Emergency Medicine

## 2016-06-19 DIAGNOSIS — Y999 Unspecified external cause status: Secondary | ICD-10-CM | POA: Insufficient documentation

## 2016-06-19 DIAGNOSIS — Y939 Activity, unspecified: Secondary | ICD-10-CM | POA: Diagnosis not present

## 2016-06-19 DIAGNOSIS — Z79899 Other long term (current) drug therapy: Secondary | ICD-10-CM | POA: Diagnosis not present

## 2016-06-19 DIAGNOSIS — S0096XA Insect bite (nonvenomous) of unspecified part of head, initial encounter: Secondary | ICD-10-CM

## 2016-06-19 DIAGNOSIS — I10 Essential (primary) hypertension: Secondary | ICD-10-CM | POA: Diagnosis not present

## 2016-06-19 DIAGNOSIS — Z7982 Long term (current) use of aspirin: Secondary | ICD-10-CM | POA: Diagnosis not present

## 2016-06-19 DIAGNOSIS — S0195XA Open bite of unspecified part of head, initial encounter: Secondary | ICD-10-CM | POA: Insufficient documentation

## 2016-06-19 DIAGNOSIS — R591 Generalized enlarged lymph nodes: Secondary | ICD-10-CM | POA: Diagnosis not present

## 2016-06-19 DIAGNOSIS — W57XXXA Bitten or stung by nonvenomous insect and other nonvenomous arthropods, initial encounter: Secondary | ICD-10-CM | POA: Insufficient documentation

## 2016-06-19 DIAGNOSIS — Z7984 Long term (current) use of oral hypoglycemic drugs: Secondary | ICD-10-CM | POA: Diagnosis not present

## 2016-06-19 DIAGNOSIS — Z8673 Personal history of transient ischemic attack (TIA), and cerebral infarction without residual deficits: Secondary | ICD-10-CM | POA: Insufficient documentation

## 2016-06-19 DIAGNOSIS — R590 Localized enlarged lymph nodes: Secondary | ICD-10-CM

## 2016-06-19 DIAGNOSIS — E119 Type 2 diabetes mellitus without complications: Secondary | ICD-10-CM | POA: Insufficient documentation

## 2016-06-19 DIAGNOSIS — Y929 Unspecified place or not applicable: Secondary | ICD-10-CM | POA: Insufficient documentation

## 2016-06-19 NOTE — ED Triage Notes (Signed)
Pt states "on Thursday I woke up and was bitten by something on the L side of my head and now the L side of my face is swollen. Pt has mild bump on L side of head and minimal swelling. Pt c/o pain.

## 2016-06-19 NOTE — ED Provider Notes (Signed)
[facility] DEPT Provider Note   CSN: [id number] Arrival date & time: 06/19/16  1814  First Provider Contact:   First MD Initiated Contact with Patient 06/19/16 2027    By signing my name below, I, [name], attest that this documentation has been prepared under the direction and in the presence of Kalman Drape, PA. Electronically Signed: [name], ED Scribe. 06/19/16. 9:01 PM.       History   Chief Complaint Chief Complaint  Patient presents with  . Insect Bite    HPI Carolyn Holland is a 45 y.o. female.  The history is provided by the patient. No language interpreter was used.   HPI Comments: Carolyn Holland is a 45 y.o. female who presents to the Emergency Department complaining of improving left sided facial swelling onset 2 days prior. Pt states that she believes that something bit her on the left side of her face. Pt presents with preauricular tenderness, TMJ tenderness and tenderness inferior to the TMJ. She reports that she is having associated throbbing pain that she rates 9/10. Pt reports that the pain is worse when palpating the area. Pt states she has tried aleve with no relief.Patient also endorses an insect bite on the anterior right wrist. Pt denies fever, abdominal pain, vomiting or other rash.    Past Medical History:  Diagnosis Date  . Anemia 12/22/2012  . Anxiety 2012  . Bilateral anterior knee pain 2012  . Diabetes mellitus without complication ([place])   . GERD (gastroesophageal reflux disease) 2000   come and go  . Hyperlipidemia   . Hypertension 2008  . Panic attack 2012   situational related to move from [place].     Patient Active Problem List   Diagnosis Date Noted  . Facial twitching   . TIA (transient ischemic attack) 11/07/2014  . Hot flashes 12/14/2013  . Venous insufficiency 12/11/2013  . Acid reflux 10/04/2013  . Morbid obesity ([place]) 08/23/2013  . Atypical chest pain 06/15/2013  . Obstructive sleep apnea 03/02/2013  .  Right hip pain 01/29/2013  . Diabetes mellitus, type 2 ([place]) 12/29/2012  . Vitamin D deficiency 12/22/2012  . Anxiety   . Hypertension   . Bilateral anterior knee pain     Past Surgical History:  Procedure Laterality Date  . ABDOMINAL HYSTERECTOMY    . CESAREAN SECTION  2002  . laproscopic knee surgery  2011   L knee for bilateral meniscal tear. Following acute injury.   [name] PARTIAL HYSTERECTOMY  2009    OB History    Gravida Para Term Preterm AB Living   4 3 3   1 3    SAB TAB Ectopic Multiple Live Births     1             Home Medications    Prior to Admission medications   Medication Sig Start Date End Date Taking? Authorizing Provider  aspirin 325 MG tablet Take 325 mg by mouth daily.     Historical Provider, MD  diclofenac sodium (VOLTAREN) 1 % GEL Apply 1 application topically 4 (four) times daily as needed (pain).     Historical Provider, MD  hydrochlorothiazide (HYDRODIURIL) 12.5 MG tablet Take 1 tablet (12.5 mg total) by mouth daily. 02/17/16   Hoyt Koch, MD  metFORMIN (GLUCOPHAGE-XR) 500 MG 24 hr tablet Take 1 tablet (500 mg total) by mouth daily with breakfast. 02/27/16   Hoyt Koch, MD  Naproxen Sodium (ALEVE PO) Take 1 tablet by mouth as needed.  Historical Provider, MD    Family History Family History  Problem Relation Age of Onset  . Diabetes Mother   . Hypertension Mother   . Heart disease Mother 74  . Stroke Mother 40    x 2  . Alcohol abuse Mother     previous   . Drug abuse Mother     previous cocaine   . Hypertension Father     Social History Social History  Substance Use Topics  . Smoking status: Never Smoker  . Smokeless tobacco: Never Used  . Alcohol use No     Comment: occassional, social, wine     Allergies   Penicillins   Review of Systems Review of Systems  Constitutional: Negative for fever.  HENT: Positive for facial swelling.   Gastrointestinal: Negative for abdominal pain and vomiting.  Skin:  Negative for rash.     Physical Exam Updated Vital Signs BP 167/97 (BP Location: Left Arm)   Pulse 75   Temp 98.5 F (36.9 C) (Oral)   Resp 18   SpO2 100%   Physical Exam  Constitutional: She appears well-developed and well-nourished. No distress.  HENT:  Head: Normocephalic and atraumatic.  Right Ear: Tympanic membrane normal.  Left Ear: Tympanic membrane normal.  Mouth/Throat: Uvula is midline and oropharynx is clear and moist.  Small area of edema and mild erythema just superior to left temple, no induration or increased warmth noted, no fluctuation, no signs of infection or cellulitis.  No edema or erythema appreciated to left side of face.  Eyes: Conjunctivae are normal. Pupils are equal, round, and reactive to light.  Pulmonary/Chest: Effort normal. No respiratory distress.  Musculoskeletal: Normal range of motion.  Lymphadenopathy:       Head (left side): Preauricular adenopathy present.  Neurological: She is alert. Coordination normal.  Skin: Skin is warm and dry. She is not diaphoretic.  anterior right wrist raised red lesion   Psychiatric: She has a normal mood and affect. Her behavior is normal.  Nursing note and vitals reviewed.    ED Treatments / Results  DIAGNOSTIC STUDIES: Oxygen Saturation is 100% on RA, normal by my interpretation.    COORDINATION OF CARE: 8:56 PM-Discussed treatment plan with pt at bedside and pt agreed to plan.     Labs (all labs ordered are listed, but only abnormal results are displayed) Labs Reviewed - No data to display  EKG  EKG Interpretation None       Radiology No results found.  Procedures Procedures (including critical care time)  Medications Ordered in ED Medications - No data to display   Initial Impression / Assessment and Plan / ED Course  I have reviewed the triage vital signs and the nursing notes.  Pertinent labs & imaging results that were available during my care of the patient were reviewed by  me and considered in my medical decision making (see chart for details).  Clinical Course   Patient presents with left sided facial swelling and pain. Patiently she was bit by a bug on her head 2 days ago. Patient has mild left-sided preauricular adenopathy. No signs of cellulitis or infection noted to head or preauricular area. It is likely the lymphadenopathy is 2/2 the bug bite. No concerns for cellulitis at this time. Instructed the patient to take Benadryl and ice the area. Instructed the patient to follow up with her primary care provider on Monday if symptoms do not improve. Discussed strict return precautions to the ED. Patient expressed understanding to  the discharge instructions.  I personally performed the services described in this documentation, which was scribed in my presence. The recorded information has been reviewed and is accurate.    Final Clinical Impressions(s) / ED Diagnoses   Final diagnoses:  Preauricular lymphadenopathy  Insect bite of head, initial encounter    New Prescriptions Discharge Medication List as of 06/19/2016  9:56 PM         Kalman Drape, PA 06/21/16 0240    Tanna Furry, MD 07/03/16 2154

## 2016-06-19 NOTE — Discharge Instructions (Signed)
Follow up with your primary care provider within 3 days if your symptoms should not improve. Take over-the-counter Zyrtec once daily and use ice or your symptoms. Return to the emergency department if your symptoms worsen, you experience headache, fever, chills, night sweats, increased swelling, or any other concerning symptoms.

## 2016-08-04 ENCOUNTER — Other Ambulatory Visit: Payer: Self-pay | Admitting: Internal Medicine

## 2016-08-05 ENCOUNTER — Other Ambulatory Visit: Payer: Self-pay | Admitting: *Deleted

## 2016-08-05 MED ORDER — HYDROCHLOROTHIAZIDE 12.5 MG PO TABS: 12.5000 mg | ORAL_TABLET | Freq: Every day | ORAL | 1 refills | Status: DC

## 2016-09-29 ENCOUNTER — Other Ambulatory Visit: Payer: Self-pay | Admitting: *Deleted

## 2016-09-29 MED ORDER — OMEPRAZOLE 40 MG PO CPDR: 40.0000 mg | DELAYED_RELEASE_CAPSULE | Freq: Every day | ORAL | 0 refills | Status: DC

## 2016-12-03 ENCOUNTER — Other Ambulatory Visit: Payer: Self-pay

## 2016-12-03 ENCOUNTER — Emergency Department (HOSPITAL_BASED_OUTPATIENT_CLINIC_OR_DEPARTMENT_OTHER): Payer: 59

## 2016-12-03 ENCOUNTER — Emergency Department (HOSPITAL_BASED_OUTPATIENT_CLINIC_OR_DEPARTMENT_OTHER)
Admission: EM | Admit: 2016-12-03 | Discharge: 2016-12-03 | Disposition: A | Discharge status: Home / Self Care | Payer: 59 | Attending: Emergency Medicine | Admitting: Emergency Medicine

## 2016-12-03 ENCOUNTER — Encounter (HOSPITAL_BASED_OUTPATIENT_CLINIC_OR_DEPARTMENT_OTHER): Payer: Self-pay | Admitting: *Deleted

## 2016-12-03 DIAGNOSIS — Z79899 Other long term (current) drug therapy: Secondary | ICD-10-CM | POA: Diagnosis not present

## 2016-12-03 DIAGNOSIS — R002 Palpitations: Secondary | ICD-10-CM | POA: Diagnosis not present

## 2016-12-03 DIAGNOSIS — R0789 Other chest pain: Secondary | ICD-10-CM | POA: Insufficient documentation

## 2016-12-03 DIAGNOSIS — Z7984 Long term (current) use of oral hypoglycemic drugs: Secondary | ICD-10-CM | POA: Diagnosis not present

## 2016-12-03 DIAGNOSIS — Z791 Long term (current) use of non-steroidal anti-inflammatories (NSAID): Secondary | ICD-10-CM | POA: Insufficient documentation

## 2016-12-03 DIAGNOSIS — I10 Essential (primary) hypertension: Secondary | ICD-10-CM | POA: Diagnosis not present

## 2016-12-03 DIAGNOSIS — E119 Type 2 diabetes mellitus without complications: Secondary | ICD-10-CM | POA: Diagnosis not present

## 2016-12-03 DIAGNOSIS — R0602 Shortness of breath: Secondary | ICD-10-CM | POA: Diagnosis not present

## 2016-12-03 DIAGNOSIS — J029 Acute pharyngitis, unspecified: Secondary | ICD-10-CM | POA: Insufficient documentation

## 2016-12-03 DIAGNOSIS — R079 Chest pain, unspecified: Secondary | ICD-10-CM

## 2016-12-03 LAB — CBC WITH DIFFERENTIAL/PLATELET
BASOS ABS: 0.1 10*3/uL (ref 0.0–0.1)
BASOS PCT: 1 %
EOS PCT: 4 %
Eosinophils Absolute: 0.3 10*3/uL (ref 0.0–0.7)
HCT: 45.3 % (ref 36.0–46.0)
Hemoglobin: 15.3 g/dL — ABNORMAL HIGH (ref 12.0–15.0)
Lymphocytes Relative: 36 %
Lymphs Abs: 2.4 10*3/uL (ref 0.7–4.0)
MCH: 27.8 pg (ref 26.0–34.0)
MCHC: 33.8 g/dL (ref 30.0–36.0)
MCV: 82.2 fL (ref 78.0–100.0)
MONO ABS: 0.6 10*3/uL (ref 0.1–1.0)
MONOS PCT: 9 %
Neutro Abs: 3.2 10*3/uL (ref 1.7–7.7)
Neutrophils Relative %: 50 %
PLATELETS: 221 10*3/uL (ref 150–400)
RBC: 5.51 MIL/uL — ABNORMAL HIGH (ref 3.87–5.11)
RDW: 13 % (ref 11.5–15.5)
WBC: 6.6 10*3/uL (ref 4.0–10.5)

## 2016-12-03 LAB — BASIC METABOLIC PANEL
ANION GAP: 6 (ref 5–15)
BUN: 18 mg/dL (ref 6–20)
CALCIUM: 9.5 mg/dL (ref 8.9–10.3)
CO2: 25 mmol/L (ref 22–32)
CREATININE: 0.72 mg/dL (ref 0.44–1.00)
Chloride: 106 mmol/L (ref 101–111)
GLUCOSE: 139 mg/dL — AB (ref 65–99)
Potassium: 3.7 mmol/L (ref 3.5–5.1)
Sodium: 137 mmol/L (ref 135–145)

## 2016-12-03 LAB — TROPONIN I: Troponin I: <0.03 ng/mL (ref <0.03)

## 2016-12-03 LAB — D-DIMER, QUANTITATIVE (NOT AT ARMC)

## 2016-12-03 MED ORDER — IBUPROFEN 800 MG PO TABS
800.0000 mg | ORAL_TABLET | Freq: Once | ORAL | Status: AC
  Administered 2016-12-03: 800 mg via ORAL
  Filled 2016-12-03: qty 1

## 2016-12-03 MED ORDER — IBUPROFEN 800 MG PO TABS: 800.0000 mg | ORAL_TABLET | Freq: Three times a day (TID) | ORAL | 0 refills | Status: DC

## 2016-12-03 NOTE — ED Provider Notes (Signed)
Emergency Department Provider Note   I have reviewed the triage vital signs and the nursing notes. By signing my name below, I, Carolyn Holland, attest that this documentation has been prepared under the direction and in the presence of Carolyn Fast, MD. Electronically Signed: [name], ED Scribe. 12/03/16. 9:08 PM.   HISTORY  Chief Complaint Shortness of Breath   HPI Comments: Carolyn Holland is a 46 y.o. female with PMHx of DM, GERD, HTN, high cholesterol, anxiety, who presents to the Emergency Department complaining of moderate, intermittent, left-sided chest pain onset four days. She reports associated palpations and associated anxiety. Pt notes she feels "like there is ball stuck" in her chest. She notes her pain radiates to her neck and is accompanied by a sore throat. Pt states having a "tightness" in her neck. Pt notes her pain is exacerbated when she is laying flat. She reports taking an aspirin with minimal relief.  She notes seeing a cardiologist for similar symptoms. She also notes feeling "hot and clammy" followed by nausea. Denies hx of blood clots in heart and lungs. She also denies recent long travel.    Past Medical History:  Diagnosis Date  . Anemia 12/22/2012  . Anxiety 2012  . Bilateral anterior knee pain 2012  . Diabetes mellitus without complication ([place])   . GERD (gastroesophageal reflux disease) 2000   come and go  . Hyperlipidemia   . Hypertension 2008  . Panic attack 2012   situational related to move from [place].     Patient Active Problem List   Diagnosis Date Noted  . Facial twitching   . TIA (transient ischemic attack) 11/07/2014  . Hot flashes 12/14/2013  . Venous insufficiency 12/11/2013  . Acid reflux 10/04/2013  . Morbid obesity ([place]) 08/23/2013  . Atypical chest pain 06/15/2013  . Obstructive sleep apnea 03/02/2013  . Right hip pain 01/29/2013  . Diabetes mellitus, type 2 ([place]) 12/29/2012  . Vitamin D deficiency 12/22/2012    . Anxiety   . Hypertension   . Bilateral anterior knee pain     Past Surgical History:  Procedure Laterality Date  . ABDOMINAL HYSTERECTOMY    . CESAREAN SECTION  2002  . laproscopic knee surgery  2011   L knee for bilateral meniscal tear. Following acute injury.   [name] PARTIAL HYSTERECTOMY  2009    Current Outpatient Rx  . Order #: [id number] Class: Normal  . Order #: [id number] Class: Normal  . Order #: [id number] Class: Historical Med  . Order #: [id number] Class: Historical Med  . Order #: [id number] Class: Historical Med  . Order #: [id number] Class: Print  . Order #: [id number] Class: Normal    Allergies Penicillins  Family History  Problem Relation Age of Onset  . Diabetes Mother   . Hypertension Mother   . Heart disease Mother 10  . Stroke Mother 40    x 2  . Alcohol abuse Mother     previous   . Drug abuse Mother     previous cocaine   . Hypertension Father     Social History Social History  Substance Use Topics  . Smoking status: Never Smoker  . Smokeless tobacco: Never Used  . Alcohol use No     Comment: occassional, social, wine    Review of Systems  Constitutional: No fever/chills Eyes: No visual changes. ENT: Sore throat. Cardiovascular: Chest pain and palpations. Respiratory: Shortness of breath. Gastrointestinal: Nausea. No abdominal pain. no vomiting.  No diarrhea.  No constipation. Genitourinary: Negative for dysuria. Musculoskeletal:  Negative for back pain. Skin: Negative for rash. Neurological: Negative for headaches, focal weakness or numbness.  10-point ROS otherwise negative.  ____________________________________________   PHYSICAL EXAM:  VITAL SIGNS: ED Triage Vitals  Enc Vitals Group     BP 12/03/16 1745 150/98     Pulse Rate 12/03/16 1745 66     Resp 12/03/16 1745 22     Temp 12/03/16 1745 98.8 F (37.1 C)     Temp Source 12/03/16 1745 Oral     SpO2 12/03/16 1745 100 %     Weight 12/03/16 1745 252 lb (114.3 kg)     Height  12/03/16 1745 5\' 6"  (1.676 m)     Pain Score 12/03/16 1750 6   Constitutional: Alert and oriented. Well appearing and in no acute distress. Eyes: Conjunctivae are normal.  Head: Atraumatic. Nose: No congestion/rhinnorhea. Mouth/Throat: Mucous membranes are moist.  Oropharynx non-erythematous. Neck: No stridor.  Cardiovascular: Normal rate, regular rhythm. Good peripheral circulation. Grossly normal heart sounds.   Respiratory: Normal respiratory effort.  No retractions. Lungs CTAB. Gastrointestinal: Soft and nontender. No distention.  Musculoskeletal: No lower extremity tenderness nor edema. No gross deformities of extremities. Neurologic:  Normal speech and language. No gross focal neurologic deficits are appreciated.  Skin:  Skin is warm, dry and intact. No rash noted.  ____________________________________________   DIAGNOSTIC STUDIES: Oxygen Saturation is 100% on RA, normal by my interpretation.    COORDINATION OF CARE: 9:08 PM Discussed treatment plan with pt at bedside which includes labs, chest imaging, EKG, nonsteroidal anti-Inflammatory drug and pt agreed to plan.    LABS (all labs ordered are listed, but only abnormal results are displayed)  Labs Reviewed  CBC WITH DIFFERENTIAL/PLATELET - Abnormal; Notable for the following:       Result Value   RBC 5.51 (*)    Hemoglobin 15.3 (*)    All other components within normal limits  BASIC METABOLIC PANEL - Abnormal; Notable for the following:    Glucose, Bld 139 (*)    All other components within normal limits  TROPONIN I  D-DIMER, QUANTITATIVE (NOT AT [facility])   ____________________________________________  EKG   EKG Interpretation  Date/Time:  Friday December 03 2016 17:50:15 EST Ventricular Rate:  85 PR Interval:  176 QRS Duration: 82 QT Interval:  372 QTC Calculation: 442 R Axis:   53 Text Interpretation:  Normal sinus rhythm Normal ECG No STEMI.  Confirmed by LONG MD, JOSHUA [phone]) on 12/03/2016 6:12:28 PM        __________________________________________  RADIOLOGY  Dg Chest 2 View  Result Date: 12/03/2016 CLINICAL DATA:  46 y/o F; shortness of breath and chest pain for 4 days. EXAM: CHEST  2 VIEW COMPARISON:  12/13/2015 chest radiograph. FINDINGS: The heart size and mediastinal contours are within normal limits in stable. Both lungs are clear. The visualized skeletal structures are unremarkable. IMPRESSION: No active cardiopulmonary disease. Electronically Signed   By: Kristine Garbe M.D.   On: 12/03/2016 20:50    ____________________________________________   PROCEDURES  Procedure(s) performed:   Procedures  None ____________________________________________   INITIAL IMPRESSION / ASSESSMENT AND PLAN / ED COURSE  Pertinent labs & imaging results that were available during my care of the patient were reviewed by me and considered in my medical decision making (see chart for details).  Patient presents to the ED with CP, SOB, and hot/cold sensation constantly for the last 4 days. No acute findings on CXR. Low suspicion for ACS. Initial troponin from triage is negative. With  4 days of symptoms I do not feel that trending is necessary. Low risk for PE by [place]. Plan for d-dimer for consideration of further imaging for PE. No infiltrate on CXR.   09:31 PM D-dimer negative. Plan for discharge with PCP follow up and return precautions.   At this time, I do not feel there is any life-threatening condition present. I have reviewed and discussed all results (EKG, imaging, lab, urine as appropriate), exam findings with patient. I have reviewed nursing notes and appropriate previous records.  I feel the patient is safe to be discharged home without further emergent workup. Discussed usual and customary return precautions. Patient and family (if present) verbalize understanding and are comfortable with this plan.  Patient will follow-up with their primary care provider. If they do not  have a primary care provider, information for follow-up has been provided to them. All questions have been answered.   ____________________________________________  FINAL CLINICAL IMPRESSION(S) / ED DIAGNOSES  Final diagnoses:  Chest pain, unspecified type     MEDICATIONS GIVEN DURING THIS VISIT:  Medications  ibuprofen (ADVIL,MOTRIN) tablet 800 mg (800 mg Oral Given 12/03/16 2113)     NEW OUTPATIENT MEDICATIONS STARTED DURING THIS VISIT:  Discharge Medication List as of 12/03/2016  9:32 PM    START taking these medications   Details  ibuprofen (ADVIL,MOTRIN) 800 MG tablet Take 1 tablet (800 mg total) by mouth 3 (three) times daily., Starting Fri 12/03/2016, Print        I personally performed the services described in this documentation, which was scribed in my presence. The recorded information has been reviewed and is accurate.    Note:  This document was prepared using Dragon voice recognition software and may include unintentional dictation errors.  Nanda Quinton, MD Emergency Medicine    Carolyn Fast, MD 12/04/16 [phone]

## 2016-12-03 NOTE — ED Triage Notes (Signed)
Sob and chest pain x 4 days. She got hot and clammy today.

## 2016-12-03 NOTE — Discharge Instructions (Signed)

## 2017-01-21 ENCOUNTER — Ambulatory Visit: Payer: 59 | Admitting: Internal Medicine

## 2017-02-07 ENCOUNTER — Emergency Department (HOSPITAL_COMMUNITY)
Admission: EM | Admit: 2017-02-07 | Discharge: 2017-02-07 | Disposition: A | Discharge status: Home / Self Care | Payer: 59 | Attending: Emergency Medicine | Admitting: Emergency Medicine

## 2017-02-07 ENCOUNTER — Emergency Department (HOSPITAL_COMMUNITY): Payer: 59

## 2017-02-07 ENCOUNTER — Encounter (HOSPITAL_COMMUNITY): Payer: Self-pay | Admitting: Emergency Medicine

## 2017-02-07 DIAGNOSIS — R002 Palpitations: Secondary | ICD-10-CM | POA: Diagnosis not present

## 2017-02-07 DIAGNOSIS — Z79899 Other long term (current) drug therapy: Secondary | ICD-10-CM | POA: Diagnosis not present

## 2017-02-07 DIAGNOSIS — Z7982 Long term (current) use of aspirin: Secondary | ICD-10-CM | POA: Insufficient documentation

## 2017-02-07 DIAGNOSIS — E119 Type 2 diabetes mellitus without complications: Secondary | ICD-10-CM | POA: Diagnosis not present

## 2017-02-07 DIAGNOSIS — I10 Essential (primary) hypertension: Secondary | ICD-10-CM | POA: Diagnosis not present

## 2017-02-07 DIAGNOSIS — R0602 Shortness of breath: Secondary | ICD-10-CM | POA: Diagnosis not present

## 2017-02-07 DIAGNOSIS — R072 Precordial pain: Secondary | ICD-10-CM | POA: Diagnosis not present

## 2017-02-07 DIAGNOSIS — Z7984 Long term (current) use of oral hypoglycemic drugs: Secondary | ICD-10-CM | POA: Diagnosis not present

## 2017-02-07 DIAGNOSIS — Z8673 Personal history of transient ischemic attack (TIA), and cerebral infarction without residual deficits: Secondary | ICD-10-CM | POA: Diagnosis not present

## 2017-02-07 DIAGNOSIS — R079 Chest pain, unspecified: Secondary | ICD-10-CM | POA: Diagnosis present

## 2017-02-07 LAB — CBC
HCT: 45.4 % (ref 36.0–46.0)
Hemoglobin: 15 g/dL (ref 12.0–15.0)
MCH: 27.5 pg (ref 26.0–34.0)
MCHC: 33 g/dL (ref 30.0–36.0)
MCV: 83.3 fL (ref 78.0–100.0)
PLATELETS: 226 10*3/uL (ref 150–400)
RBC: 5.45 MIL/uL — ABNORMAL HIGH (ref 3.87–5.11)
RDW: 13.2 % (ref 11.5–15.5)
WBC: 5.9 10*3/uL (ref 4.0–10.5)

## 2017-02-07 LAB — BASIC METABOLIC PANEL
Anion gap: 7 (ref 5–15)
BUN: 14 mg/dL (ref 6–20)
CALCIUM: 9.3 mg/dL (ref 8.9–10.3)
CHLORIDE: 107 mmol/L (ref 101–111)
CO2: 28 mmol/L (ref 22–32)
CREATININE: 0.96 mg/dL (ref 0.44–1.00)
GFR calc Af Amer: >60 mL/min (ref >60)
GFR calc non Af Amer: >60 mL/min (ref >60)
Glucose, Bld: 124 mg/dL — ABNORMAL HIGH (ref 65–99)
Potassium: 3.5 mmol/L (ref 3.5–5.1)
SODIUM: 142 mmol/L (ref 135–145)

## 2017-02-07 LAB — I-STAT TROPONIN, ED
Troponin i, poc: 0 ng/mL (ref 0.00–0.08)
Troponin i, poc: 0 ng/mL (ref 0.00–0.08)

## 2017-02-07 LAB — D-DIMER, QUANTITATIVE: D-Dimer, Quant: <0.27 ug/mL-FEU (ref 0.00–0.50)

## 2017-02-07 NOTE — ED Triage Notes (Signed)
Per [facility]: Pt to ED from home c/o heart palpitations and dizziness. Pt states she had just gotten in the shower and felt tingling in hands and fingertips, "tension feeling in her R jaw," dizziness, and SOB. Pt got out of the shower to see if it would help and sts it only got worse, called EMS. Pt reports tingling in fingers x 1 week, intermittent and the other symptoms are new - dizziness lasted 45 minutes, and she became nauseated en route from riding in the truck. EMS VS: 124/78, HR 94 NSR, 98% RA, CBG 138. Hx anxiety and dx of new onset diabetes. Pt A&O x 4, denies SOB, dizziness, CP at this time.

## 2017-02-07 NOTE — ED Notes (Signed)
Patient transported to X-ray 

## 2017-02-07 NOTE — ED Provider Notes (Addendum)
[facility] DEPT Provider Note   CSN: [id number] Arrival date & time: 02/07/17  1623     History   Chief Complaint Chief Complaint  Patient presents with  . Anxiety    HPI Carolyn Holland is a 46 y.o. female.  Patient presents with a complaint of anterior chest pain started 230s after noon and resolved at 4 PM. Associated with some shortness of breath. Patient also has history of panic attacks was wondering if some of it was really limited to anxiety. Patient currently has no chest pain. Cardiac risk factors include hypertension hyperlipidemia. Also diabetes without complications.      Past Medical History:  Diagnosis Date  . Anemia 12/22/2012  . Anxiety 2012  . Bilateral anterior knee pain 2012  . Diabetes mellitus without complication ([place])   . GERD (gastroesophageal reflux disease) 2000   come and go  . Hyperlipidemia   . Hypertension 2008  . Panic attack 2012   situational related to move from [place].     Patient Active Problem List   Diagnosis Date Noted  . Facial twitching   . TIA (transient ischemic attack) 11/07/2014  . Hot flashes 12/14/2013  . Venous insufficiency 12/11/2013  . Acid reflux 10/04/2013  . Morbid obesity ([place]) 08/23/2013  . Atypical chest pain 06/15/2013  . Obstructive sleep apnea 03/02/2013  . Right hip pain 01/29/2013  . Diabetes mellitus, type 2 ([place]) 12/29/2012  . Vitamin D deficiency 12/22/2012  . Anxiety   . Hypertension   . Bilateral anterior knee pain     Past Surgical History:  Procedure Laterality Date  . ABDOMINAL HYSTERECTOMY    . CESAREAN SECTION  2002  . laproscopic knee surgery  2011   L knee for bilateral meniscal tear. Following acute injury.   [name] PARTIAL HYSTERECTOMY  2009    OB History    Gravida Para Term Preterm AB Living   4 3 3   1 3    SAB TAB Ectopic Multiple Live Births     1             Home Medications    Prior to Admission medications   Medication Sig Start Date End Date Taking?  Authorizing Provider  aspirin 325 MG tablet Take 325 mg by mouth daily.     Historical Provider, MD  diclofenac sodium (VOLTAREN) 1 % GEL Apply 1 application topically 4 (four) times daily as needed (pain).     Historical Provider, MD  hydrochlorothiazide (HYDRODIURIL) 12.5 MG tablet Take 1 tablet (12.5 mg total) by mouth daily. 08/05/16   Hoyt Koch, MD  ibuprofen (ADVIL,MOTRIN) 800 MG tablet Take 1 tablet (800 mg total) by mouth 3 (three) times daily. 12/03/16   Margette Fast, MD  metFORMIN (GLUCOPHAGE-XR) 500 MG 24 hr tablet Take 1 tablet (500 mg total) by mouth daily with breakfast. 02/27/16   Hoyt Koch, MD  Naproxen Sodium (ALEVE PO) Take 1 tablet by mouth as needed.    Historical Provider, MD  omeprazole (PRILOSEC) 40 MG capsule Take 1 capsule (40 mg total) by mouth daily. Yearly physical due in March must see Md for refills 09/29/16   Hoyt Koch, MD    Family History Family History  Problem Relation Age of Onset  . Diabetes Mother   . Hypertension Mother   . Heart disease Mother 59  . Stroke Mother 40    x 2  . Alcohol abuse Mother     previous   . Drug abuse Mother  previous cocaine   . Hypertension Father     Social History Social History  Substance Use Topics  . Smoking status: Never Smoker  . Smokeless tobacco: Never Used  . Alcohol use No     Comment: occassional, social, wine     Allergies   Penicillins   Review of Systems Review of Systems  Constitutional: Positive for diaphoresis.  HENT: Negative for congestion.   Eyes: Negative for visual disturbance.  Respiratory: Positive for shortness of breath.   Cardiovascular: Positive for chest pain and palpitations.  Gastrointestinal: Negative for abdominal pain, nausea and vomiting.  Genitourinary: Negative for dysuria.  Musculoskeletal: Positive for neck pain.  Skin: Negative for rash.  Neurological: Positive for dizziness.  Hematological: Does not bruise/bleed easily.    Psychiatric/Behavioral: Negative for confusion.     Physical Exam Updated Vital Signs BP 137/87   Pulse 76   Temp 98 F (36.7 C) (Oral)   Resp 17   SpO2 100%   Physical Exam  Constitutional: She is oriented to person, place, and time. She appears well-developed and well-nourished. No distress.  HENT:  Head: Normocephalic and atraumatic.  Mouth/Throat: Oropharynx is clear and moist.  Eyes: Conjunctivae and EOM are normal. Pupils are equal, round, and reactive to light.  Neck: Normal range of motion. Neck supple.  Cardiovascular: Normal rate, regular rhythm and normal heart sounds.   Pulmonary/Chest: Effort normal and breath sounds normal. No respiratory distress.  Abdominal: Soft. Bowel sounds are normal. There is no tenderness.  Musculoskeletal: Normal range of motion. She exhibits no edema.  Neurological: She is alert and oriented to person, place, and time. No cranial nerve deficit or sensory deficit. She exhibits normal muscle tone. Coordination normal.  Skin: Skin is warm.  Nursing note and vitals reviewed.    ED Treatments / Results  Labs (all labs ordered are listed, but only abnormal results are displayed) Labs Reviewed  BASIC METABOLIC PANEL - Abnormal; Notable for the following:       Result Value   Glucose, Bld 124 (*)    All other components within normal limits  CBC - Abnormal; Notable for the following:    RBC 5.45 (*)    All other components within normal limits  D-DIMER, QUANTITATIVE (NOT AT [facility])  I-STAT TROPOININ, ED  I-STAT TROPOININ, ED    EKG  EKG Interpretation  Date/Time:  Monday February 07 2017 16:30:10 EDT Ventricular Rate:  79 PR Interval:  172 QRS Duration: 92 QT Interval:  384 QTC Calculation: 440 R Axis:   65 Text Interpretation:  Normal sinus rhythm Cannot rule out Anterior infarct , age undetermined Abnormal ECG No significant change since last tracing Confirmed by Rogene Houston  MD, Eather Chaires [phone]) on 02/07/2017 4:41:04 PM Also confirmed  by Rogene Houston  MD, Keylani Perlstein [phone]), editor [place] CT, Leda Gauze [phone])  on 02/07/2017 4:42:16 PM       Radiology Dg Chest 2 View  Result Date: 02/07/2017 CLINICAL DATA:  Heart palpitations. Dizziness. Shortness of breath. EXAM: CHEST  2 VIEW COMPARISON:  12/03/2016 FINDINGS: The heart size and mediastinal contours are within normal limits. Both lungs are clear. The visualized skeletal structures are unremarkable. IMPRESSION: Normal chest. Electronically Signed   By: Lorriane Shire M.D.   On: 02/07/2017 16:58    Procedures Procedures (including critical care time)  Medications Ordered in ED Medications - No data to display   Initial Impression / Assessment and Plan / ED Course  I have reviewed the triage vital signs and the nursing  notes.  Pertinent labs & imaging results that were available during my care of the patient were reviewed by me and considered in my medical decision making (see chart for details).     Workup for the palpitations chest discomfort and shortness of breath without any acute findings. Troponins 2 negative. D-dimer negative. Patient's symptoms occurred at 2:30 in the afternoon and lasted until 4 in the afternoon. They have resolved completely. Patient does have a cardiologist and primary care doctor to follow-up with. Patient given precautions to return for any recurrent palpitations not resolving within 40 minutes.   Patient's d-dimer negative troponins 2 negative chest x-ray negative/without significant abnormalities.  It is possible symptoms could be related to anxiety patient has had similar symptoms related to that she believes in the past.  Patient has been asymptomatic here.  Final Clinical Impressions(s) / ED Diagnoses   Final diagnoses:  Palpitations  SOB (shortness of breath)  Precordial pain    New Prescriptions New Prescriptions   No medications on file     Fredia Sorrow, MD 02/07/17 1610    Fredia Sorrow, MD 02/23/17 1943

## 2017-02-07 NOTE — ED Notes (Signed)
PT states understanding of care given, follow up care. PT ambulated from ED to car with a steady gait.  

## 2017-02-07 NOTE — Discharge Instructions (Signed)
Follow-up with your doctors follow up with your cardiologist. Return for rapid heart rate not resolving in 40 minutes or any new or worse symptoms. Today's workup without evidence of any arrhythmia on cardiac monitor. No evidence of an acute cardiac event or heart attack. No evidence of any blood clots in the lungs. Work note provided.

## 2017-02-11 ENCOUNTER — Encounter: Payer: Self-pay | Admitting: Internal Medicine

## 2017-02-11 ENCOUNTER — Ambulatory Visit (INDEPENDENT_AMBULATORY_CARE_PROVIDER_SITE_OTHER): Payer: 59 | Admitting: Internal Medicine

## 2017-02-11 VITALS — BP 137/98 | HR 78 | Ht 66.0 in | Wt 261.0 lb

## 2017-02-11 DIAGNOSIS — R0789 Other chest pain: Secondary | ICD-10-CM | POA: Diagnosis not present

## 2017-02-11 DIAGNOSIS — Z79899 Other long term (current) drug therapy: Secondary | ICD-10-CM | POA: Insufficient documentation

## 2017-02-11 DIAGNOSIS — E7800 Pure hypercholesterolemia, unspecified: Secondary | ICD-10-CM | POA: Insufficient documentation

## 2017-02-11 DIAGNOSIS — I10 Essential (primary) hypertension: Secondary | ICD-10-CM | POA: Diagnosis not present

## 2017-02-11 MED ORDER — CHLORTHALIDONE 25 MG PO TABS: 25.0000 mg | ORAL_TABLET | Freq: Every day | ORAL | 5 refills | Status: DC

## 2017-02-11 NOTE — Progress Notes (Signed)
OFFICE NOTE  Chief Complaint:  No complaints  Primary Care Physician: Adline Mango, MD  HPI:  Carolyn Holland is a 46 y.o. female who recently presented: Emergency department with chest pain. She reports is been having palpitations and associated anxiety. She wakes up at night with heart racing. On the days she went to the emergency room she developed a sharp and burning quality chest pain over the left anterior chest which she could easily point to. It was not worse with exertion or relieved by rest. It did not radiate. Workup was negative for ischemia. Since then she's been significantly concerned about coronary disease. She said she had a cardiac workup 2 years ago at [facility] in [place]. The testing was reportedly negative however she was told that she did have a small amount of coronary artery disease. It's not clear how this diagnosis was made. We will be requesting records. She does have a number of cardiac risk factors including hypertension, dyslipidemia and recent diabetes along with morbid obesity. Apparently she had a sleep study in 2011 which was a home study and suggested possible sleep apnea, however she had just gotten out of the hospital. She had a repeat sleep study recently in [place] which was negative. She was recently prescribed Lipitor but had side effects with that medicine and has not taken it. Her LDL cholesterol is 120. Her hemoglobin A1c is 7. She's been referred to an endocrinologist who she will be seeing shortly.  02/11/2017  Carolyn Holland returns today for follow-up. Recently she's had some sticking pain in her left upper chest. She describes this as a sharp pain that comes and goes very quickly. She's also had some numbness and tingling in the fourth and fifth digit of her right hand. Sometimes it's worse after sleeping it funny positions at night. She also gets some feeling of a "pulling at her jaw". She does not describe this as pain when she chews but  sometimes some numbness on the side of her face. Before she's had some pain because of the back of her neck and head for which she was seen in the ER. Recently she had an episode of this chest discomfort and felt like her heart was racing. She called EMS and they went to her house. She was noted to be in sinus tachycardia just over 100. EKGs did not suggest an acute coronary syndrome. She was taken the hospital and ruled out for MI and a d-dimer was negative. We did do stress testing on her last year which was negative for ischemia. Blood pressure today is elevated somewhat with diastolic at 98. She does not think that her hydrochlorothiazide is working as well as her indapamide which she was on previously.  PMHx:  Past Medical History:  Diagnosis Date  . Anemia 12/22/2012  . Anxiety 2012  . Bilateral anterior knee pain 2012  . Diabetes mellitus without complication ([place])   . GERD (gastroesophageal reflux disease) 2000   come and go  . Hyperlipidemia   . Hypertension 2008  . Panic attack 2012   situational related to move from [place].     Past Surgical History:  Procedure Laterality Date  . ABDOMINAL HYSTERECTOMY    . CESAREAN SECTION  2002  . laproscopic knee surgery  2011   L knee for bilateral meniscal tear. Following acute injury.   [name] PARTIAL HYSTERECTOMY  2009    FAMHx:  Family History  Problem Relation Age of Onset  . Diabetes  Mother   . Hypertension Mother   . Heart disease Mother 55  . Stroke Mother 40    x 2  . Alcohol abuse Mother     previous   . Drug abuse Mother     previous cocaine   . Hypertension Father     SOCHx:   reports that she has never smoked. She has never used smokeless tobacco. She reports that she does not drink alcohol or use drugs.  ALLERGIES:  Allergies  Allergen Reactions  . Penicillins Hives    Has patient had a PCN reaction causing immediate rash, facial/tongue/throat swelling, SOB or lightheadedness with hypotension: Yes Has  patient had a PCN reaction causing severe rash involving mucus membranes or skin necrosis: No Has patient had a PCN reaction that required hospitalization emergency room visit but did not stay Has patient had a PCN reaction occurring within the last 10 years: No If all of the above answers are "NO", then may proceed with Cephalosporin use.    ROS: Pertinent items noted in HPI and remainder of comprehensive ROS otherwise negative.  HOME MEDS: Current Outpatient Prescriptions  Medication Sig Dispense Refill  . aspirin 325 MG tablet Take 325 mg by mouth daily.     . Dapagliflozin-Metformin HCl ER 10-500 MG TB24 Take 1 tablet by mouth daily.    . diclofenac sodium (VOLTAREN) 1 % GEL Apply 1 application topically 4 (four) times daily as needed (pain).     [name] ibuprofen (ADVIL,MOTRIN) 800 MG tablet Take 1 tablet (800 mg total) by mouth 3 (three) times daily. 21 tablet 0  . metFORMIN (GLUCOPHAGE-XR) 500 MG 24 hr tablet Take 1 tablet (500 mg total) by mouth daily with breakfast. 30 tablet 5  . Naproxen Sodium (ALEVE PO) Take 1 tablet by mouth as needed.    [name] omeprazole (PRILOSEC) 40 MG capsule Take 1 capsule (40 mg total) by mouth daily. Yearly physical due in March must see Md for refills 90 capsule 0  . chlorthalidone (HYGROTON) 25 MG tablet Take 1 tablet (25 mg total) by mouth daily. 30 tablet 5   No current facility-administered medications for this visit.     LABS/IMAGING: No results found for this or any previous visit (from the past 48 hour(s)). No results found.  WEIGHTS: Wt Readings from Last 3 Encounters:  02/11/17 261 lb (118.4 kg)  12/03/16 252 lb (114.3 kg)  03/03/16 266 lb (120.7 kg)    VITALS: BP (!) 137/98   Pulse 78   Ht 5\' 6"  (1.676 m)   Wt 261 lb (118.4 kg)   BMI 42.13 kg/m   EXAM: General appearance: alert and no distress Neck: no carotid bruit and no JVD Lungs: clear to auscultation bilaterally Heart: regular rate and rhythm, S1, S2 normal, no murmur, click,  rub or gallop Abdomen: soft, non-tender; bowel sounds normal; no masses,  no organomegaly Extremities: extremities normal, atraumatic, no cyanosis or edema Pulses: 2+ and symmetric Skin: Skin color, texture, turgor normal. No rashes or lesions Neurologic: Grossly normal Psych: Mildly anxious, very animated an energetic  EKG: Deferred  ASSESSMENT: 1. Chest pain, somewhat atypical for cardiac pain (negative myoview in 2017) 2. Hypertension 3. Dyslipidemia 4. Type 2 diabetes 5. Family history of premature coronary disease in her mother 19. Morbid obesity 7. Palpitations  PLAN: 1.   Carolyn Holland had another episode of what sounds like atypical chest pain. She also has some numbness and tingling in her right arm and occasional some symptoms in the back  of her neck. This may be a cervicalgia or she could have some type of impingement either in the right shoulder or in the right carpal tunnel. Nerve conduction studies would be helpful and I deferred her primary care provider at this. She needs aggressive risk factor modification and repeat lipid profile would be helpful. She is not currently on any lipid lowering therapy. Finally, she does not feel that her eye to chlorothiazide works well. Diastolic pressure is elevated today. There is clear evidence that chlorthalidone is probably more effective than HCTZ. I like to switch her to chlorthalidone 25 mg daily. We'll recheck that lipid profile and metabolic profile next week. Follow-up annually or sooner as necessary with me and in her hypertension clinic in a few weeks for repeat blood pressure check.  Pixie Casino, MD, [facility] Attending Cardiologist [place] [facility] 02/11/2017, 11:24 AM

## 2017-02-11 NOTE — Patient Instructions (Signed)
Your physician has recommended you make the following change in your medication:  1. STOP hydrochlorothiazide 2. START chlorthalidone 25mg  once daily  Your physician recommends that you return for lab work NEXT WEEK (fasting) to check cholesterol  Your physician recommends that you schedule a follow-up appointment in 2-3 weeks for a blood pressure check appointment with clinical pharmacist  Your physician wants you to follow-up in: ONE YEAR with Dr. Debara Pickett. You will receive a reminder letter in the mail two months in advance. If you don't receive a letter, please call our office to schedule the follow-up appointment.

## 2017-02-27 DIAGNOSIS — F411 Generalized anxiety disorder: Secondary | ICD-10-CM | POA: Insufficient documentation

## 2017-05-28 ENCOUNTER — Emergency Department (HOSPITAL_COMMUNITY): Payer: 59

## 2017-05-28 ENCOUNTER — Other Ambulatory Visit: Payer: Self-pay

## 2017-05-28 ENCOUNTER — Encounter (HOSPITAL_COMMUNITY): Payer: Self-pay | Admitting: Emergency Medicine

## 2017-05-28 ENCOUNTER — Emergency Department (HOSPITAL_COMMUNITY)
Admission: EM | Admit: 2017-05-28 | Discharge: 2017-05-28 | Disposition: A | Discharge status: Home / Self Care | Payer: 59 | Attending: Emergency Medicine | Admitting: Emergency Medicine

## 2017-05-28 DIAGNOSIS — F419 Anxiety disorder, unspecified: Secondary | ICD-10-CM | POA: Diagnosis not present

## 2017-05-28 DIAGNOSIS — E119 Type 2 diabetes mellitus without complications: Secondary | ICD-10-CM | POA: Diagnosis not present

## 2017-05-28 DIAGNOSIS — Z79899 Other long term (current) drug therapy: Secondary | ICD-10-CM | POA: Diagnosis not present

## 2017-05-28 DIAGNOSIS — I10 Essential (primary) hypertension: Secondary | ICD-10-CM | POA: Insufficient documentation

## 2017-05-28 DIAGNOSIS — R079 Chest pain, unspecified: Secondary | ICD-10-CM | POA: Diagnosis present

## 2017-05-28 DIAGNOSIS — R002 Palpitations: Secondary | ICD-10-CM | POA: Diagnosis not present

## 2017-05-28 DIAGNOSIS — Z7984 Long term (current) use of oral hypoglycemic drugs: Secondary | ICD-10-CM | POA: Insufficient documentation

## 2017-05-28 DIAGNOSIS — R0789 Other chest pain: Secondary | ICD-10-CM

## 2017-05-28 DIAGNOSIS — Z8673 Personal history of transient ischemic attack (TIA), and cerebral infarction without residual deficits: Secondary | ICD-10-CM | POA: Diagnosis not present

## 2017-05-28 DIAGNOSIS — D649 Anemia, unspecified: Secondary | ICD-10-CM | POA: Insufficient documentation

## 2017-05-28 LAB — CBC
HCT: 46.9 % — ABNORMAL HIGH (ref 36.0–46.0)
Hemoglobin: 15.7 g/dL — ABNORMAL HIGH (ref 12.0–15.0)
MCH: 28.4 pg (ref 26.0–34.0)
MCHC: 33.5 g/dL (ref 30.0–36.0)
MCV: 84.8 fL (ref 78.0–100.0)
Platelets: 221 10*3/uL (ref 150–400)
RBC: 5.53 MIL/uL — ABNORMAL HIGH (ref 3.87–5.11)
RDW: 13.4 % (ref 11.5–15.5)
WBC: 7.9 10*3/uL (ref 4.0–10.5)

## 2017-05-28 LAB — BASIC METABOLIC PANEL
Anion gap: 6 (ref 5–15)
BUN: 15 mg/dL (ref 6–20)
CALCIUM: 9.8 mg/dL (ref 8.9–10.3)
CO2: 30 mmol/L (ref 22–32)
Chloride: 102 mmol/L (ref 101–111)
Creatinine, Ser: 0.82 mg/dL (ref 0.44–1.00)
GFR calc Af Amer: >60 mL/min (ref >60)
GLUCOSE: 196 mg/dL — AB (ref 65–99)
Potassium: 3.8 mmol/L (ref 3.5–5.1)
Sodium: 138 mmol/L (ref 135–145)

## 2017-05-28 LAB — I-STAT TROPONIN, ED
TROPONIN I, POC: 0.01 ng/mL (ref 0.00–0.08)
Troponin i, poc: 0 ng/mL (ref 0.00–0.08)

## 2017-05-28 MED ORDER — LORAZEPAM 0.5 MG PO TABS: 0.5000 mg | ORAL_TABLET | Freq: Two times a day (BID) | ORAL | 0 refills | Status: DC

## 2017-05-28 NOTE — ED Provider Notes (Signed)
[facility] DEPT Provider Note   CSN: [id number] Arrival date & time: 05/28/17  1359     History   Chief Complaint Chief Complaint  Patient presents with  . Palpitations  . Chest Pain    HPI Carolyn Holland is a 46 y.o. female.  HPI Patient presents to the emergency department with palpitations and chest discomfort that seems to be chronic in nature, but she states over the last 5 days is been more persistent.  She states that she has seen a cardiologist in the past for similar symptoms along with her primary doctor.  The patient states that she does have a history of anxiety, but is tired of everyone blaming this on anxiety.  She feels like there something more going on.  She feels like medications could be a cause of this issue.  Patient states that nothing seems make the condition better.  She states that certain activities make the condition worse.  She states she noticed it happens a fair amount while she is working.  Patient states that she has had some recent changes in medicationsThe patient denies  shortness of breath, headache,blurred vision, neck pain, fever, cough, weakness, numbness, dizziness, anorexia, edema, abdominal pain, nausea, vomiting, diarrhea, rash, back pain, dysuria, hematemesis, bloody stool, near syncope, or syncope. Past Medical History:  Diagnosis Date  . Anemia 12/22/2012  . Anxiety 2012  . Bilateral anterior knee pain 2012  . Diabetes mellitus without complication ([place])   . GERD (gastroesophageal reflux disease) 2000   come and go  . Hyperlipidemia   . Hypertension 2008  . Panic attack 2012   situational related to move from [place].     Patient Active Problem List   Diagnosis Date Noted  . Medication management 02/11/2017  . Elevated LDL cholesterol level 02/11/2017  . Facial twitching   . TIA (transient ischemic attack) 11/07/2014  . Hot flashes 12/14/2013  . Venous insufficiency 12/11/2013  . Acid reflux 10/04/2013  . Morbid obesity due  to excess calories ([place]) 08/23/2013  . Atypical chest pain 06/15/2013  . Obstructive sleep apnea 03/02/2013  . Right hip pain 01/29/2013  . Diabetes mellitus, type 2 ([place]) 12/29/2012  . Vitamin D deficiency 12/22/2012  . Anxiety   . Benign hypertension   . Bilateral anterior knee pain     Past Surgical History:  Procedure Laterality Date  . ABDOMINAL HYSTERECTOMY    . CESAREAN SECTION  2002  . laproscopic knee surgery  2011   L knee for bilateral meniscal tear. Following acute injury.   [name] PARTIAL HYSTERECTOMY  2009    OB History    Gravida Para Term Preterm AB Living   4 3 3   1 3    SAB TAB Ectopic Multiple Live Births     1             Home Medications    Prior to Admission medications   Medication Sig Start Date End Date Taking? Authorizing Provider  aspirin 325 MG tablet Take 325 mg by mouth daily.     [provider]  chlorthalidone (HYGROTON) 25 MG tablet Take 1 tablet (25 mg total) by mouth daily. 02/11/17 05/12/17  Pixie Casino, MD  Dapagliflozin-Metformin HCl ER 10-500 MG TB24 Take 1 tablet by mouth daily. 05/07/16   [provider]  diclofenac sodium (VOLTAREN) 1 % GEL Apply 1 application topically 4 (four) times daily as needed (pain).     [provider]  ibuprofen (ADVIL,MOTRIN) 800 MG tablet Take  1 tablet (800 mg total) by mouth 3 (three) times daily. 12/03/16   Long, Wonda Olds, MD  metFORMIN (GLUCOPHAGE-XR) 500 MG 24 hr tablet Take 1 tablet (500 mg total) by mouth daily with breakfast. 02/27/16   Hoyt Koch, MD  Naproxen Sodium (ALEVE PO) Take 1 tablet by mouth as needed.    [provider]  omeprazole (PRILOSEC) 40 MG capsule Take 1 capsule (40 mg total) by mouth daily. Yearly physical due in March must see Md for refills 09/29/16   Hoyt Koch, MD    Family History Family History  Problem Relation Age of Onset  . Diabetes Mother   . Hypertension Mother   . Heart disease Mother 78  . Stroke Mother 40         x 2  . Alcohol abuse Mother        previous   . Drug abuse Mother        previous cocaine   . Hypertension Father     Social History Social History  Substance Use Topics  . Smoking status: Never Smoker  . Smokeless tobacco: Never Used  . Alcohol use No     Comment: occassional, social, wine     Allergies   Penicillins   Review of Systems Review of Systems All other systems negative except as documented in the HPI. All pertinent positives and negatives as reviewed in the HPI.  Physical Exam Updated Vital Signs BP 121/84   Pulse 80   Temp 98 F (36.7 C) (Oral)   Resp 12   Ht 5\' 6"  (1.676 m)   Wt 115.7 kg (255 lb)   SpO2 99%   BMI 41.16 kg/m   Physical Exam  Constitutional: She is oriented to person, place, and time. She appears well-developed and well-nourished. No distress.  HENT:  Head: Normocephalic and atraumatic.  Mouth/Throat: Oropharynx is clear and moist.  Eyes: Pupils are equal, round, and reactive to light.  Neck: Normal range of motion. Neck supple.  Cardiovascular: Normal rate, regular rhythm and normal heart sounds.  Exam reveals no gallop and no friction rub.   No murmur heard. Pulmonary/Chest: Effort normal and breath sounds normal. No respiratory distress. She has no wheezes.  Abdominal: Soft. Bowel sounds are normal. She exhibits no distension. There is no tenderness.  Neurological: She is alert and oriented to person, place, and time. She exhibits normal muscle tone. Coordination normal.  Skin: Skin is warm and dry. Capillary refill takes less than 2 seconds. No rash noted. No erythema.  Psychiatric: Her behavior is normal. Her mood appears anxious.  Nursing note and vitals reviewed.    ED Treatments / Results  Labs (all labs ordered are listed, but only abnormal results are displayed) Labs Reviewed  BASIC METABOLIC PANEL - Abnormal; Notable for the following:       Result Value   Glucose, Bld 196 (*)    All other components  within normal limits  CBC - Abnormal; Notable for the following:    RBC 5.53 (*)    Hemoglobin 15.7 (*)    HCT 46.9 (*)    All other components within normal limits  I-STAT TROPOININ, ED  Randolm Idol, ED    EKG  EKG Interpretation None       Radiology Dg Chest 2 View  Result Date: 05/28/2017 CLINICAL DATA:  Chest pain. Left upper extremity tingling sensation. EXAM: CHEST  2 VIEW COMPARISON:  February 07, 2017 FINDINGS: Lungs are clear. Heart size and  pulmonary vascularity are normal. No adenopathy. There is mild degenerative change in the thoracic spine. IMPRESSION: No edema or consolidation. Electronically Signed   By: Lowella Grip III M.D.   On: 05/28/2017 14:42    Procedures Procedures (including critical care time)  Medications Ordered in ED Medications - No data to display   Initial Impression / Assessment and Plan / ED Course  I have reviewed the triage vital signs and the nursing notes.  Pertinent labs & imaging results that were available during my care of the patient were reviewed by me and considered in my medical decision making (see chart for details).    I do feel that there is an anxiety component patient's chest symptoms.  Also advised her to follow-up with her cardiologist for further evaluation and possible Holter monitor.  Patient agrees the plan and all questions were answered.  I did advise that this does appear to be possibly a component of anxiety, but will need further evaluation.  Patient voiced an understanding   Final Clinical Impressions(s) / ED Diagnoses   Final diagnoses:  None    New Prescriptions New Prescriptions   No medications on file     Rebeca Allegra 05/29/17 0052    Tanna Furry, MD 06/08/17 [phone]

## 2017-05-28 NOTE — ED Triage Notes (Signed)
Pt to ED from home with multiple complaints - states she's had recurrent chest heaviness and palpitations that have been evaluated in the past, but over the last 5 days, it's increased in frequency and has been accompanied by new symptoms, including L hand tingling, L upper arm burning, pulsating feeling in her L temple, and a feeling as if she burned her tongue - but states she didn't. Pt reports cardiac eval in past came back normal, states it comes at random times. Pt denies SOB/N/V, but reports lightheadedness when pain worsens. Resp e/u, skin warm/dry. Pulses and grip strength both equal and strong bilaterally.

## 2017-05-28 NOTE — Discharge Instructions (Signed)
Your testing here today was normal.  Follow-up with your cardiologist, along with her primary doctor.  Return here as needed.

## 2017-05-30 LAB — CBG MONITORING, ED: GLUCOSE-CAPILLARY: 153 mg/dL — AB (ref 65–99)

## 2017-12-08 ENCOUNTER — Emergency Department (HOSPITAL_COMMUNITY)
Admission: EM | Admit: 2017-12-08 | Discharge: 2017-12-08 | Disposition: A | Discharge status: Home / Self Care | Payer: 59 | Attending: Emergency Medicine | Admitting: Emergency Medicine

## 2017-12-08 ENCOUNTER — Other Ambulatory Visit: Payer: Self-pay

## 2017-12-08 ENCOUNTER — Emergency Department (HOSPITAL_COMMUNITY): Payer: 59

## 2017-12-08 ENCOUNTER — Encounter (HOSPITAL_COMMUNITY): Payer: Self-pay | Admitting: Emergency Medicine

## 2017-12-08 DIAGNOSIS — Z7982 Long term (current) use of aspirin: Secondary | ICD-10-CM | POA: Insufficient documentation

## 2017-12-08 DIAGNOSIS — Z8673 Personal history of transient ischemic attack (TIA), and cerebral infarction without residual deficits: Secondary | ICD-10-CM | POA: Insufficient documentation

## 2017-12-08 DIAGNOSIS — R072 Precordial pain: Secondary | ICD-10-CM | POA: Diagnosis not present

## 2017-12-08 DIAGNOSIS — Z79899 Other long term (current) drug therapy: Secondary | ICD-10-CM | POA: Diagnosis not present

## 2017-12-08 DIAGNOSIS — Z7984 Long term (current) use of oral hypoglycemic drugs: Secondary | ICD-10-CM | POA: Diagnosis not present

## 2017-12-08 DIAGNOSIS — R079 Chest pain, unspecified: Secondary | ICD-10-CM | POA: Diagnosis present

## 2017-12-08 DIAGNOSIS — E119 Type 2 diabetes mellitus without complications: Secondary | ICD-10-CM | POA: Diagnosis not present

## 2017-12-08 DIAGNOSIS — I10 Essential (primary) hypertension: Secondary | ICD-10-CM | POA: Diagnosis not present

## 2017-12-08 LAB — CBC WITH DIFFERENTIAL/PLATELET
Basophils Absolute: 0 10*3/uL (ref 0.0–0.1)
Basophils Relative: 1 %
Eosinophils Absolute: 0.2 10*3/uL (ref 0.0–0.7)
Eosinophils Relative: 3 %
HCT: 45.4 % (ref 36.0–46.0)
Hemoglobin: 15.5 g/dL — ABNORMAL HIGH (ref 12.0–15.0)
Lymphocytes Relative: 37 %
Lymphs Abs: 2.6 10*3/uL (ref 0.7–4.0)
MCH: 28.5 pg (ref 26.0–34.0)
MCHC: 34.1 g/dL (ref 30.0–36.0)
MCV: 83.5 fL (ref 78.0–100.0)
Monocytes Absolute: 0.4 10*3/uL (ref 0.1–1.0)
Monocytes Relative: 5 %
Neutro Abs: 3.8 10*3/uL (ref 1.7–7.7)
Neutrophils Relative %: 54 %
Platelets: 232 10*3/uL (ref 150–400)
RBC: 5.44 MIL/uL — ABNORMAL HIGH (ref 3.87–5.11)
RDW: 12.7 % (ref 11.5–15.5)
WBC: 7 10*3/uL (ref 4.0–10.5)

## 2017-12-08 LAB — BASIC METABOLIC PANEL
Anion gap: 11 (ref 5–15)
BUN: 17 mg/dL (ref 6–20)
CO2: 24 mmol/L (ref 22–32)
Calcium: 9.4 mg/dL (ref 8.9–10.3)
Chloride: 101 mmol/L (ref 101–111)
Creatinine, Ser: 0.85 mg/dL (ref 0.44–1.00)
GFR calc Af Amer: >60 mL/min (ref >60)
GFR calc non Af Amer: >60 mL/min (ref >60)
Glucose, Bld: 332 mg/dL — ABNORMAL HIGH (ref 65–99)
Potassium: 3.4 mmol/L — ABNORMAL LOW (ref 3.5–5.1)
Sodium: 136 mmol/L (ref 135–145)

## 2017-12-08 LAB — I-STAT TROPONIN, ED
TROPONIN I, POC: 0 ng/mL (ref 0.00–0.08)
Troponin i, poc: 0 ng/mL (ref 0.00–0.08)

## 2017-12-08 NOTE — ED Triage Notes (Signed)
Pt reports left sided chest neck and arm pain x6 days, states that pain improves with tylenol/asa, also improves with xanax but when meds where off pain returns.

## 2017-12-08 NOTE — Discharge Instructions (Signed)
Your testing has been unremarkable, there is no signs of heart attack however you are at risk for heart problems given your medical history.  Please take your medications exactly as prescribed, follow-up with your family doctor this week for a recheck and please call your cardiologist, Dr. Debara Pickett for a close follow-up.  Please take your daily aspirin and return to the emergency department for severe or worsening pain or difficulty breathing.

## 2017-12-08 NOTE — ED Provider Notes (Signed)
  Patient placed in Quick Look pathway, seen and evaluated   Chief Complaint: Chest pain  HPI:   47 year old female presents today with complaints of chest pain.  Patient notes that she has had 6 days of off-and-on chest pain.  She reports this feels like indigestion with radiation up into her left anterior chest wall and neck.  She notes the neck and chest pain have been coming and going for months and has been evaluated with no significant findings.  Patient notes that symptoms are worse at night when lying down in bed she feels her heart racing and nauseous.  Patient notes that she takes antianxiety medication which seems to improve her symptoms, but they recur after the medication wears off.  Patient denies any personal cardiac history.  She notes that her mother had MIs when she was in her 25s, no iliac history in her father.  She is a non-smoker, with a history of hypertension hyperlipidemia, she denies any drug use.  Patient reports last week she had very minor swelling in her lower legs, she denies any presently.  ROS: Chest pain (one)  Physical Exam:   Gen: No distress  Neuro: Awake and Alert  Skin: Warm    Focused Exam: Cardiac regular rate and rhythm no murmurs; pulmonary clear lungs throughout no adventitious lung sounds-musculoskeletal no distal edema   Initiation of care has begun. The patient has been counseled on the process, plan, and necessity for staying for the completion/evaluation, and the remainder of the medical screening examination     Francee Gentile 12/08/17 1856    Noemi Chapel, MD 12/08/17 2207

## 2017-12-08 NOTE — ED Provider Notes (Signed)
[facility] EMERGENCY DEPARTMENT Provider Note   CSN: [id number] Arrival date & time: 12/08/17  9518     History   Chief Complaint Chief Complaint  Patient presents with  . Chest Pain    HPI Carolyn Holland is a 47 y.o. female.  HPI  The patient is a 47 year old female, she has a known history of diabetes, high blood pressure, high cholesterol, she reports that her mother had drug use and a history of a myocardial infarction and strokes because of her drug use and her grandmother died from a dye load to evaluate her heart.  She does not know anymore about her family history but reports that over the last couple of years she has had intermittent chest pain and has been evaluated by Dr. Debara Pickett with cardiology who has performed a stress test a couple of years ago which was unremarkable, she follows up yearly and has another appointment in March.  Over the last 6 days there has been some increasing amounts of chest discomfort associated with left arm pain and left neck pain.  This is not exertional or positional, it totally goes away with Tylenol but then comes back.  She does not feel like she has any swelling of her arms or legs and has no fevers or chills though she has the occasional cough.  She presents to the emergency department for ongoing evaluation of the chest discomfort.  It is poorly described, persistent, has been rather persistent for the last 6 days both day and night.  Past Medical History:  Diagnosis Date  . Anemia 12/22/2012  . Anxiety 2012  . Bilateral anterior knee pain 2012  . Diabetes mellitus without complication ([place])   . GERD (gastroesophageal reflux disease) 2000   come and go  . Hyperlipidemia   . Hypertension 2008  . Panic attack 2012   situational related to move from [place].     Patient Active Problem List   Diagnosis Date Noted  . Medication management 02/11/2017  . Elevated LDL cholesterol level 02/11/2017  . Facial twitching   .  TIA (transient ischemic attack) 11/07/2014  . Hot flashes 12/14/2013  . Venous insufficiency 12/11/2013  . Acid reflux 10/04/2013  . Morbid obesity due to excess calories ([place]) 08/23/2013  . Atypical chest pain 06/15/2013  . Obstructive sleep apnea 03/02/2013  . Right hip pain 01/29/2013  . Diabetes mellitus, type 2 ([place]) 12/29/2012  . Vitamin D deficiency 12/22/2012  . Anxiety   . Benign hypertension   . Bilateral anterior knee pain     Past Surgical History:  Procedure Laterality Date  . ABDOMINAL HYSTERECTOMY    . CESAREAN SECTION  2002  . laproscopic knee surgery  2011   L knee for bilateral meniscal tear. Following acute injury.   [name] PARTIAL HYSTERECTOMY  2009    OB History    Gravida Para Term Preterm AB Living   4 3 3   1 3    SAB TAB Ectopic Multiple Live Births     1             Home Medications    Prior to Admission medications   Medication Sig Start Date End Date Taking? Authorizing Provider  acetaminophen (TYLENOL) 500 MG tablet Take 1,000 mg by mouth every 6 (six) hours as needed.   Yes [provider]  aspirin 325 MG tablet Take 325 mg by mouth daily.    Yes [provider]  chlorthalidone (HYGROTON) 25 MG tablet Take  1 tablet (25 mg total) by mouth daily. 02/11/17 12/08/17 Yes Hilty, Nadean Corwin, MD  Dapagliflozin-Metformin HCl ER 10-500 MG TB24 Take 1 tablet by mouth daily. 05/07/16  Yes [provider]  omeprazole (PRILOSEC) 40 MG capsule Take 1 capsule (40 mg total) by mouth daily. Yearly physical due in March must see Md for refills 09/29/16  Yes Hoyt Koch, MD  metFORMIN (GLUCOPHAGE-XR) 500 MG 24 hr tablet Take 1 tablet (500 mg total) by mouth daily with breakfast. Patient not taking: Reported on 12/08/2017 02/27/16   Hoyt Koch, MD    Family History Family History  Problem Relation Age of Onset  . Diabetes Mother   . Hypertension Mother   . Heart disease Mother 24  . Stroke Mother 40       x 2  . Alcohol  abuse Mother        previous   . Drug abuse Mother        previous cocaine   . Hypertension Father     Social History Social History   Tobacco Use  . Smoking status: Never Smoker  . Smokeless tobacco: Never Used  Substance Use Topics  . Alcohol use: No    Alcohol/week: 0.0 oz    Comment: occassional, social, wine  . Drug use: No     Allergies   Penicillins   Review of Systems Review of Systems  All other systems reviewed and are negative.    Physical Exam Updated Vital Signs BP 114/76   Pulse 78   Temp 98.6 F (37 C) (Oral)   Resp 16   SpO2 96%   Physical Exam  Constitutional: She appears well-developed and well-nourished. No distress.  HENT:  Head: Normocephalic and atraumatic.  Mouth/Throat: Oropharynx is clear and moist. No oropharyngeal exudate.  Eyes: Conjunctivae and EOM are normal. Pupils are equal, round, and reactive to light. Right eye exhibits no discharge. Left eye exhibits no discharge. No scleral icterus.  Neck: Normal range of motion. Neck supple. No JVD present. No thyromegaly present.  Cardiovascular: Normal rate, regular rhythm, normal heart sounds and intact distal pulses. Exam reveals no gallop and no friction rub.  No murmur heard. Pulmonary/Chest: Effort normal and breath sounds normal. No respiratory distress. She has no wheezes. She has no rales.  Abdominal: Soft. Bowel sounds are normal. She exhibits no distension and no mass. There is no tenderness.  Musculoskeletal: Normal range of motion. She exhibits no edema or tenderness.  Lymphadenopathy:    She has no cervical adenopathy.  Neurological: She is alert. Coordination normal.  No numbness or weakness of the left upper or the right upper extremity.  Skin: Skin is warm and dry. No rash noted. No erythema.  Psychiatric: She has a normal mood and affect. Her behavior is normal.  Nursing note and vitals reviewed.    ED Treatments / Results  Labs (all labs ordered are listed, but  only abnormal results are displayed) Labs Reviewed  CBC WITH DIFFERENTIAL/PLATELET - Abnormal; Notable for the following components:      Result Value   RBC 5.44 (*)    Hemoglobin 15.5 (*)    All other components within normal limits  BASIC METABOLIC PANEL - Abnormal; Notable for the following components:   Potassium 3.4 (*)    Glucose, Bld 332 (*)    All other components within normal limits  I-STAT TROPONIN, ED  I-STAT TROPONIN, ED    EKG  EKG Interpretation  Date/Time:  Thursday December 08 2017 20:59:13 EST Ventricular Rate:  84 PR Interval:    QRS Duration: 103 QT Interval:  388 QTC Calculation: 459 R Axis:   60 Text Interpretation:  Sinus rhythm Normal ECG Confirmed by Noemi Chapel [phone]) on 12/08/2017 9:56:35 PM       Radiology Dg Chest 2 View  Result Date: 12/08/2017 CLINICAL DATA:  Complains of chest pain. Patient notes that she has had 6 days of off-and-on chest pain. She reports this feels like indigestion with radiation up into her left anterior chest wall and neck. EXAM: CHEST  2 VIEW COMPARISON:  05/28/2017 FINDINGS: Heart size is normal. The lungs are free of focal consolidations and pleural effusions. No pulmonary edema. IMPRESSION: No active cardiopulmonary disease. Electronically Signed   By: Nolon Nations M.D.   On: 12/08/2017 19:49    Procedures Procedures (including critical care time)  Medications Ordered in ED Medications - No data to display   Initial Impression / Assessment and Plan / ED Course  I have reviewed the triage vital signs and the nursing notes.  Pertinent labs & imaging results that were available during my care of the patient were reviewed by me and considered in my medical decision making (see chart for details).     The patient's cardiac exam is unremarkable, the troponin was negative, the EKG is totally normal and the chest x-ray reveals no signs of abnormal findings.  She is well-appearing, vital signs reviewed, I reviewed  with the patient the need for another troponin prior to discharging her for follow-up in the outpatient setting and she is agreeable to this plan.  Her pain in the arm could be related to the neck as she states that she has a pain in the neck that comes on with the arm even in the absence of chest pain.  There is no neurologic findings to suggest a neurologic injury or radiculopathy  Second trop neg cxr neg Labs reassuring Pt given results Stable for d/c Has had neg stress in last 2 years EKG normal Has f/u with cardiology = Dr. Debara Pickett  Final Clinical Impressions(s) / ED Diagnoses   Final diagnoses:  Precordial pain    ED Discharge Orders    None       Noemi Chapel, MD 12/08/17 2327

## 2017-12-13 ENCOUNTER — Encounter: Payer: Self-pay | Admitting: Adult Health

## 2017-12-14 NOTE — Progress Notes (Signed)
Cardiology Office Note   Date:  12/15/2017   ID:  Carolyn Holland, DOB Mar 01, 1971, MRN [id number]  PCP:  [facility]  Cardiologist: Dr. Debara Pickett  Chief Complaint  Patient presents with  . Follow-up  . Hypertension  . Hyperlipidemia    wants lab work done she is fasting     History of Present Illness: Carolyn Holland is a 47 y.o. female who presents for ongoing assessment and management of noncardiac chest pain, prior history of hypertension, hyperlipidemia, type 2 diabetes, and anxiety.  When last seen by Dr. Debara Pickett on 02/11/2017, she continued to have complaints of sharp chest comes and goes very quickly.    She also had numbness and tingling in the fourth and fifth digit of her right hand.  The patient was given reassurance, as chest pain was atypical for cardiac pain and in light of negative Myoview in 2017, no further  cardiac testing was planned.  Lab work was completed to evaluate current cholesterol status.  She was started on chlorthalidone 25 mg daily and HCTZ was discontinued for better blood pressure control.  She was to follow-up annually.  She was seen in the emergency room on 12/08/2017 for recurrent chest pain, radiating down her left arm and radiating up in her left neck and jaw.  The patient is very anxious, worried about her heart disease as she has family history of premature CAD.  She also admits she is not been taking her statin as directed and is restarted this in light of recurrent chest pain worried that she was having buildup of plaque.  She becomes tearful when she is discussing her worries about the possibility of having heart disease.  The patient was ruled out for ACS, EKG was normal, troponins were normal, she had no abnormalities in her lab work in [place].   Past Medical History:  Diagnosis Date  . Anemia 12/22/2012  . Anxiety 2012  . Bilateral anterior knee pain 2012  . Diabetes mellitus without complication ([place])   . GERD (gastroesophageal  reflux disease) 2000   come and go  . Hyperlipidemia   . Hypertension 2008  . Panic attack 2012   situational related to move from [place].     Past Surgical History:  Procedure Laterality Date  . ABDOMINAL HYSTERECTOMY    . CESAREAN SECTION  2002  . laproscopic knee surgery  2011   L knee for bilateral meniscal tear. Following acute injury.   [name] PARTIAL HYSTERECTOMY  2009     Current Outpatient Medications  Medication Sig Dispense Refill  . ALPRAZolam (XANAX) 0.5 MG tablet Take 1 tablet by mouth daily as needed.    [name] aspirin 325 MG tablet Take 325 mg by mouth daily.     . Dapagliflozin-Metformin HCl ER 10-500 MG TB24 Take 1 tablet by mouth daily.    [name] omeprazole (PRILOSEC) 40 MG capsule Take 1 capsule (40 mg total) by mouth daily. Yearly physical due in March must see Md for refills 90 capsule 0  . rosuvastatin (CRESTOR) 20 MG tablet Take 20 mg by mouth every evening.    . chlorthalidone (HYGROTON) 25 MG tablet Take 1 tablet (25 mg total) by mouth daily. 30 tablet 5   No current facility-administered medications for this visit.     Allergies:   Penicillins    Social History:  The patient  reports that  has never smoked. she has never used smokeless tobacco. She reports that she does not drink alcohol or use  drugs.   Family History:  The patient's family history includes Alcohol abuse in her mother; Diabetes in her mother; Drug abuse in her mother; Heart disease (age of onset: 63) in her mother; Hypertension in her father and mother; Stroke (age of onset: 52) in her mother.    ROS: All other systems are reviewed and negative. Unless otherwise mentioned in H&P    PHYSICAL EXAM: VS:  BP 115/82   Pulse 84   Ht 5\' 6"  (1.676 m)   Wt 254 lb (115.2 kg)   BMI 41.00 kg/m  , BMI Body mass index is 41 kg/m. GEN: Well nourished, well developed, in no acute distress  HEENT: normal  Neck: no JVD, carotid bruits, or masses Cardiac: RRR; no murmurs, rubs, or gallops,no edema    Respiratory:  clear to auscultation bilaterally, normal work of breathing GI: soft, nontender, nondistended, + BS MS: no deformity or atrophy  Skin: warm and dry, no rash Neuro:  Strength and sensation are intact Psych: euthymic mood, full affect, she is not tearful.    Recent Labs: 12/08/2017: BUN 17; Creatinine, Ser 0.85; Hemoglobin 15.5; Platelets 232; Potassium 3.4; Sodium 136    Lipid Panel    Component Value Date/Time   CHOL 200 02/12/2016 1004   TRIG 54.0 02/12/2016 1004   HDL 68.90 02/12/2016 1004   CHOLHDL 3 02/12/2016 1004   VLDL 10.8 02/12/2016 1004   LDLCALC 120 (H) 02/12/2016 1004      Wt Readings from Last 3 Encounters:  12/15/17 254 lb (115.2 kg)  05/28/17 255 lb (115.7 kg)  02/11/17 261 lb (118.4 kg)      Other studies Reviewed: NM Stress Test 2016-03-20  ASSESSMENT AND PLAN:  1.  Recurrent chest pain: Left-sided, radiating to the left shoulder down the left arm up in left jaw and neck.  Occurring with and without exertion.  Patient is extremely anxious and worried that she has coronary artery disease despite normal stress test in 201 she admits to not taking statin therapy as directed.  She was seen in the emergency room on 1, 17, 2019 and was ruled out for ACS.  She was requesting cardiac catheterization.  She has a family history of premature coronary artery disease along with multiple risk factors for CAD to include diabetes, obesity, hypercholesterolemia, and hypertension.  With Dr. Sallyanne Kuster who is DOD at [facility] today.  He has reviewed the case.  Although this indication for catheterization is soft, we will proceed to rule out coronary artery disease in light of multiple cardiovascular risk factors and ongoing discomfort.  The patient understands that risks include but are not limited to stroke (1 in 1000), death (1 in 39), kidney failure [usually temporary] (1 in 500), bleeding (1 in 200), allergic reaction [possibly serious] (1 in 200), and agrees  to proceed.   She is scheduled for cardiac catheterization on January 31, 8:30 AM, with Dr. Ellyn Hack.  2.  Hypertension: Despite anxiety, the patient's blood pressure is currently well controlled.  Not make any changes in her medication regimen at this time.  3.  Hypercholesterolemia: Patient is not been consistent taking statin therapy.  Was just restarted over the last few days.  She is encouraged to be compliant with this.  She will need to have follow-up lipids and LFTs when she is consistent on medication regimen.  Current medicines are reviewed at length with the patient today.    Labs/ tests ordered today include: Pre-cardiac catheterization orders, and labs. Phill Myron. Purcell Nails  DNP, ANP, AACC   12/15/2017 11:43 AM    [facility]. [address] [place], [place] [address] Phone: [phone]; Fax: [phone]

## 2017-12-15 ENCOUNTER — Encounter: Payer: Self-pay | Admitting: Adult Health

## 2017-12-15 ENCOUNTER — Ambulatory Visit (INDEPENDENT_AMBULATORY_CARE_PROVIDER_SITE_OTHER): Payer: 59 | Admitting: Adult Health

## 2017-12-15 VITALS — BP 115/82 | HR 84 | Ht 66.0 in | Wt 254.0 lb

## 2017-12-15 DIAGNOSIS — R0789 Other chest pain: Secondary | ICD-10-CM

## 2017-12-15 DIAGNOSIS — R079 Chest pain, unspecified: Secondary | ICD-10-CM | POA: Diagnosis not present

## 2017-12-15 DIAGNOSIS — I10 Essential (primary) hypertension: Secondary | ICD-10-CM | POA: Diagnosis not present

## 2017-12-15 DIAGNOSIS — E7800 Pure hypercholesterolemia, unspecified: Secondary | ICD-10-CM

## 2017-12-15 NOTE — Patient Instructions (Signed)
   [facility] [address] [place] [address] Dept: [phone] Loc: [phone]  Allisson Schindel  12/15/2017  You are scheduled for a Cardiac Catheterization on Thursday, January 31 with Dr. Glenetta Hew.  1. Please arrive at the [facility] (Main Entrance A) at [facility]: [address] [place], [place] [address] at 8:00 AM (two hours before your procedure to ensure your preparation). Free valet parking service is available.   Special note: Every effort is made to have your procedure done on time. Please understand that emergencies sometimes delay scheduled procedures.  2. Diet: Do not eat or drink anything after midnight prior to your procedure except sips of water to take medications.  3. Labs: You will need to have blood drawn on Thursday, January 24 at [place], [place]  Open: [place] (Lunch 12:30 - 1:30)   Phone: [phone]. You do not need to be fasting.  On the morning of your procedure, take all your morning medications.  You may use only sips of water.  5. Plan for one night stay--bring personal belongings. 6. Bring a current list of your medications and current insurance cards. 7. You MUST have a responsible person to drive you home. 8. Someone MUST be with you the first 24 hours after you arrive home or your discharge will be delayed. 9. Please wear clothes that are easy to get on and off and wear slip-on shoes.  Thank you for allowing [place] to care for you!   -- [facility]

## 2017-12-16 LAB — CBC
Hematocrit: 45.7 % (ref 34.0–46.6)
Hemoglobin: 15.4 g/dL (ref 11.1–15.9)
MCH: 28.2 pg (ref 26.6–33.0)
MCHC: 33.7 g/dL (ref 31.5–35.7)
MCV: 84 fL (ref 79–97)
Platelets: 261 10*3/uL (ref 150–379)
RBC: 5.47 x10E6/uL — AB (ref 3.77–5.28)
RDW: 13.3 % (ref 12.3–15.4)
WBC: 5.2 10*3/uL (ref 3.4–10.8)

## 2017-12-16 LAB — BASIC METABOLIC PANEL

## 2017-12-16 LAB — PROTIME-INR
INR: 1 (ref 0.8–1.2)
Prothrombin Time: 10.5 s (ref 9.1–12.0)

## 2017-12-19 ENCOUNTER — Telehealth: Payer: Self-pay

## 2017-12-19 NOTE — Telephone Encounter (Signed)
D/w KL  She states that she need to come in and discuss with [facility] tomorrow  Pt notified she will be here at 4pm tomorrow 1-29

## 2017-12-19 NOTE — Telephone Encounter (Signed)
Returned call to pt to give recent results for pre-cath labs. Pt states that she wants to reschedule Cath and to make sure that Dr Debara Pickett wants to have this done. Pt states that she has a lot of anxiety and states that she is not comfortable with deciding such a invasive procedure using a 48 year old stress test to decide this.  She is asking if this is needed urgently or can she reschedule when her daughter that is a nurse can get here. She would like to reschedule cath until 2/14-2/15-19 if possible. Will d/w KL and CB.

## 2017-12-20 ENCOUNTER — Encounter: Payer: Self-pay | Admitting: Internal Medicine

## 2017-12-20 ENCOUNTER — Ambulatory Visit (INDEPENDENT_AMBULATORY_CARE_PROVIDER_SITE_OTHER): Payer: 59 | Admitting: Internal Medicine

## 2017-12-20 VITALS — BP 130/88 | HR 91 | Ht 66.0 in | Wt 253.6 lb

## 2017-12-20 DIAGNOSIS — R002 Palpitations: Secondary | ICD-10-CM | POA: Diagnosis not present

## 2017-12-20 DIAGNOSIS — R079 Chest pain, unspecified: Secondary | ICD-10-CM | POA: Diagnosis not present

## 2017-12-20 DIAGNOSIS — I83813 Varicose veins of bilateral lower extremities with pain: Secondary | ICD-10-CM | POA: Diagnosis not present

## 2017-12-20 DIAGNOSIS — Z01812 Encounter for preprocedural laboratory examination: Secondary | ICD-10-CM

## 2017-12-20 DIAGNOSIS — I10 Essential (primary) hypertension: Secondary | ICD-10-CM | POA: Insufficient documentation

## 2017-12-20 DIAGNOSIS — F419 Anxiety disorder, unspecified: Secondary | ICD-10-CM

## 2017-12-20 DIAGNOSIS — E7800 Pure hypercholesterolemia, unspecified: Secondary | ICD-10-CM | POA: Insufficient documentation

## 2017-12-20 MED ORDER — METOPROLOL TARTRATE 50 MG PO TABS: ORAL_TABLET | ORAL | 0 refills | Status: DC

## 2017-12-20 NOTE — Patient Instructions (Addendum)
Your physician has recommended that you wear a holter monitor for 48 hours. Holter monitors are medical devices that record the heart's electrical activity. Doctors most often use these monitors to diagnose arrhythmias. Arrhythmias are problems with the speed or rhythm of the heartbeat. The monitor is a small, portable device. You can wear one while you do your normal daily activities. This is usually used to diagnose what is causing palpitations/syncope (passing out).  Your physician recommends that you schedule a follow-up appointment with Dr. Debara Pickett after your coronary CT.  Please arrive at the [facility] main entrance of [facility] at _______ AM/PM (30-45 minutes prior to test start time)  [facility] [address] [place], [place] [address] [phone]  Proceed to the [facility] Radiology Department (First Floor).  Please follow these instructions carefully (unless otherwise directed):  Hold all erectile dysfunction medications at least 48 hours prior to test.  Before Your Test: you will need blood work done at our office ([facility]) about 1 week prior to your test date. No appointment is needed. The lab is open M-F 8am-430pm. Please bring the lab slip provided.  On the Night Before the Test: . Drink plenty of water. . Do not consume any caffeinated/decaffeinated beverages or chocolate 12 hours prior to your test. . Do not take any antihistamines 12 hours prior to your test. . Do not take Dapagliflozin-Metformin 24 hours prior to test.   On the Day of the Test: . Drink plenty of water. Do not drink any water within one hour of the test. . Do not eat any food 4 hours prior to the test. . You may take your regular medications prior to the test. . IF NOT ON A BETA BLOCKER - Take 50 mg of lopressor (metoprolol) one hour before the test. Prescription has been sent to your pharmacy   After the Test: . Drink plenty of water. . After receiving IV contrast, you may  experience a mild flushed feeling. This is normal. . On occasion, you may experience a mild rash up to 24 hours after the test. This is not dangerous. If this occurs, you can take Benadryl 25 mg and increase your fluid intake. . If you experience trouble breathing, this can be serious. If it is severe call 911 IMMEDIATELY. If it is mild, please call our office. . If you take any of these medications: Glipizide/Metformin, Avandament, Glucavance, please do not take 48 hours after completing test.

## 2017-12-20 NOTE — Progress Notes (Signed)
OFFICE NOTE  Chief Complaint:  Chest pain, left arm pain, bumps on her legs  Primary Care Physician: [facility]  HPI:  Carolyn Holland is a 47 y.o. female who recently presented: Emergency department with chest pain. She reports is been having palpitations and associated anxiety. She wakes up at night with heart racing. On the days she went to the emergency room she developed a sharp and burning quality chest pain over the left anterior chest which she could easily point to. It was not worse with exertion or relieved by rest. It did not radiate. Workup was negative for ischemia. Since then she's been significantly concerned about coronary disease. She said she had a cardiac workup 2 years ago at [facility] in [place]. The testing was reportedly negative however she was told that she did have a small amount of coronary artery disease. It's not clear how this diagnosis was made. We will be requesting records. She does have a number of cardiac risk factors including hypertension, dyslipidemia and recent diabetes along with morbid obesity. Apparently she had a sleep study in 2011 which was a home study and suggested possible sleep apnea, however she had just gotten out of the hospital. She had a repeat sleep study recently in [place] which was negative. She was recently prescribed Lipitor but had side effects with that medicine and has not taken it. Her LDL cholesterol is 120. Her hemoglobin A1c is 7. She's been referred to an endocrinologist who she will be seeing shortly.  02/11/2017  Carolyn Holland returns today for follow-up. Recently she's had some sticking pain in her left upper chest. She describes this as a sharp pain that comes and goes very quickly. She's also had some numbness and tingling in the fourth and fifth digit of her right hand. Sometimes it's worse after sleeping it funny positions at night. She also gets some feeling of a "pulling at her  jaw". She does not describe this as pain when she chews but sometimes some numbness on the side of her face. Before she's had some pain because of the back of her neck and head for which she was seen in the ER. Recently she had an episode of this chest discomfort and felt like her heart was racing. She called EMS and they went to her house. She was noted to be in sinus tachycardia just over 100. EKGs did not suggest an acute coronary syndrome. She was taken the hospital and ruled out for MI and a d-dimer was negative. We did do stress testing on her last year which was negative for ischemia. Blood pressure today is elevated somewhat with diastolic at 98. She does not think that her hydrochlorothiazide is working as well as her indapamide which she was on previously.  12/20/2017  Carolyn Holland returns today for follow-up.  She continues to have palpitations and chest discomfort.  She is also now having radiation to her left arm.  She has a feeling of pulling in her jaw.  She is very concerned this is angina.  She had the similar symptoms however they seem to be getting worse.  Stress testing in 2017 was negative for ischemia.  EKG has shown sinus tachycardia in the past.  She wants additional monitoring.  Ultimately with her ongoing symptoms she was seen by Jory Sims, DNP, who recommended heart catheterization.  This was presented to Dr. Loletha Grayer (the doctor of the day), who concurred with left heart catheterization.  She however she is quite anxious about the catheterization and it is not sure that she wants to proceed.  She is asking if there is another way to evaluate her arteries.  We discussed options today including coronary CT angiogram which may be noninvasive way to evaluate her arteries. She is also concerned about hard "bumps" on her legs - they have been present for a while and she said are uncomfortable at times.  PMHx:  Past Medical History:  Diagnosis Date  . Anemia 12/22/2012  . Anxiety 2012  .  Bilateral anterior knee pain 2012  . Diabetes mellitus without complication ([place])   . GERD (gastroesophageal reflux disease) 2000   come and go  . Hyperlipidemia   . Hypertension 2008  . Panic attack 2012   situational related to move from [place].     Past Surgical History:  Procedure Laterality Date  . ABDOMINAL HYSTERECTOMY    . CESAREAN SECTION  2002  . laproscopic knee surgery  2011   L knee for bilateral meniscal tear. Following acute injury.   Carolyn Holland PARTIAL HYSTERECTOMY  2009    FAMHx:  Family History  Problem Relation Age of Onset  . Diabetes Mother   . Hypertension Mother   . Heart disease Mother 53  . Stroke Mother 40       x 2  . Alcohol abuse Mother        previous   . Drug abuse Mother        previous cocaine   . Hypertension Father     SOCHx:   reports that  has never smoked. she has never used smokeless tobacco. She reports that she does not drink alcohol or use drugs.  ALLERGIES:  Allergies  Allergen Reactions  . Penicillins Hives    Has patient had a PCN reaction causing immediate rash, facial/tongue/throat swelling, SOB or lightheadedness with hypotension: Yes Has patient had a PCN reaction causing severe rash involving mucus membranes or skin necrosis: No Has patient had a PCN reaction that required hospitalization emergency room visit but did not stay Has patient had a PCN reaction occurring within the last 10 years: No If all of the above answers are "NO", then may proceed with Cephalosporin use.    ROS: Pertinent items noted in HPI and remainder of comprehensive ROS otherwise negative.  HOME MEDS: Current Outpatient Medications  Medication Sig Dispense Refill  . acetaminophen (TYLENOL) 500 MG tablet Take 1,000 mg by mouth every 8 (eight) hours as needed for mild pain, moderate pain or headache.    . ALPRAZolam (XANAX) 0.5 MG tablet Take 0.5 mg by mouth daily as needed for anxiety.     Carolyn Holland aspirin 325 MG tablet Take 325 mg by mouth daily.       . chlorthalidone (HYGROTON) 25 MG tablet Take 25 mg by mouth daily.    . Dapagliflozin-Metformin HCl ER 10-500 MG TB24 Take 1 tablet by mouth daily at 12 noon.     Carolyn Holland omeprazole (PRILOSEC) 40 MG capsule Take 1 capsule (40 mg total) by mouth daily. Yearly physical due in March must see Md for refills 90 capsule 0  . rosuvastatin (CRESTOR) 20 MG tablet Take 20 mg by mouth every evening.    . metoprolol tartrate (LOPRESSOR) 50 MG tablet Take ONE tablet ONE HOUR prior to test. 1 tablet 0   No current facility-administered medications for this visit.     LABS/IMAGING: No results found for this or any previous visit (from the past  48 hour(s)). No results found.  WEIGHTS: Wt Readings from Last 3 Encounters:  12/20/17 253 lb 9.6 oz (115 kg)  12/15/17 254 lb (115.2 kg)  05/28/17 255 lb (115.7 kg)    VITALS: BP 130/88   Pulse 91   Ht 5\' 6"  (1.676 m)   Wt 253 lb 9.6 oz (115 kg)   SpO2 100%   BMI 40.93 kg/m   EXAM: General appearance: alert and mild distress Extremities: varicose veins noted and venous stasis dermatitis noted Pulses: 2+ and symmetric Skin: dark discoloration over the legs Psych: Moderately anxious  EKG: Deferred  ASSESSMENT: 1. Persistent chest pain (negative myoview in 2017) 2. Hypertension 3. Dyslipidemia 4. Type 2 diabetes 5. Family history of premature coronary disease in her mother 47. Morbid obesity 7. Palpitations 8. Varicose veins  PLAN: 1.   Ms. Boulay continues to have chest pain symptoms and now is having pain that extends into her left arm.  She had a negative Myoview in 2017.  I agree with the need for more definitive evaluation of her coronary anatomy.  We had offered cardiac catheterization however she is sufficiently anxious and does not feel that she wants to proceed with such an invasive procedure.  Another good option would be CT coronary angiography.  Given her younger age and a high negative predictive value of the study, I think she is a  good candidate.  I would not repeat a Myoview since her study was negative less than 2 years ago.  She does have a number of cardiac risk factors and would be a good candidate for the procedure.  She does have some claustrophobia and is advised to take Xanax prior to the procedure.  In addition we will provide her with a beta-blocker to take since she is tachycardic today although I believe this is related to her anxiety.  She is also been having palpitations and wants to further monitor those.  We will go ahead and set up for 48-hour monitor.  Plan follow-up with me afterward.  Pixie Casino, MD, [facility], [place] Director of the Advanced Lipid Disorders &  Cardiovascular Risk Reduction Clinic Diplomate of the American Board of Clinical Lipidology Attending Cardiologist  Direct Dial: [phone]  Fax: [phone]  Website:  [email or web address] 12/20/2017, 5:24 PM

## 2017-12-21 ENCOUNTER — Telehealth: Payer: Self-pay | Admitting: Internal Medicine

## 2017-12-21 NOTE — Telephone Encounter (Signed)
Left message for pt to call back to schedule 48 hour holter monitor.

## 2017-12-22 ENCOUNTER — Ambulatory Visit (HOSPITAL_COMMUNITY): Admission: RE | Admit: 2017-12-22 | Payer: 59 | Source: Ambulatory Visit | Admitting: Cardiology

## 2017-12-22 ENCOUNTER — Encounter (HOSPITAL_COMMUNITY): Admission: RE | Payer: Self-pay | Source: Ambulatory Visit

## 2017-12-22 SURGERY — LEFT HEART CATH AND CORONARY ANGIOGRAPHY
Anesthesia: LOCAL

## 2017-12-29 ENCOUNTER — Ambulatory Visit (INDEPENDENT_AMBULATORY_CARE_PROVIDER_SITE_OTHER): Payer: 59

## 2017-12-29 DIAGNOSIS — R002 Palpitations: Secondary | ICD-10-CM

## 2018-01-19 ENCOUNTER — Ambulatory Visit (HOSPITAL_COMMUNITY): Payer: 59

## 2018-01-19 ENCOUNTER — Ambulatory Visit (HOSPITAL_COMMUNITY): Admission: RE | Admit: 2018-01-19 | Payer: 59 | Source: Ambulatory Visit

## 2018-04-26 ENCOUNTER — Telehealth: Payer: Self-pay | Admitting: *Deleted

## 2018-04-26 NOTE — Telephone Encounter (Signed)
Patient called and moved to her appt to her first day off

## 2018-04-28 ENCOUNTER — Ambulatory Visit: Payer: 59 | Admitting: Gynecology

## 2018-05-15 ENCOUNTER — Telehealth: Payer: Self-pay | Admitting: *Deleted

## 2018-05-15 NOTE — Telephone Encounter (Signed)
Called and left Santiago Glad a message at [facility] [employer]. Request the last pap smear from 05/13/17 along with surgery op note, H&P, and path

## 2018-05-16 ENCOUNTER — Telehealth: Payer: Self-pay | Admitting: Oncology

## 2018-05-16 NOTE — Telephone Encounter (Signed)
Patient called back and said she had her hysterectomy at [facility] with Dr. Bradd Burner.

## 2018-05-16 NOTE — Telephone Encounter (Signed)
Called [facility] - Dr. Etheleen Sia office and asked for op report from 2009.  They said they only have records from 2012 for an office visit.  [facility] records and requested op note, H&P and pathology report from patient's 2009 surgery.  They said they would have to request the records and then they will fax them to [phone].

## 2018-05-17 ENCOUNTER — Ambulatory Visit: Payer: 59 | Admitting: Obstetrics

## 2018-05-17 NOTE — Telephone Encounter (Signed)
[facility] again and they said they could not find records for a surgery in 2009.  They requested the exact surgery date.

## 2018-05-24 ENCOUNTER — Telehealth: Payer: Self-pay | Admitting: *Deleted

## 2018-05-24 NOTE — Telephone Encounter (Signed)
Called and spoke with the patient, scheduled appt for July 9th

## 2018-05-30 ENCOUNTER — Inpatient Hospital Stay: Payer: 59 | Admitting: Gynecology

## 2018-05-30 ENCOUNTER — Telehealth: Payer: Self-pay | Admitting: *Deleted

## 2018-05-30 NOTE — Telephone Encounter (Signed)
Called and left the patient a message to call me back. Need to reschedule the appt from todayu

## 2018-05-31 ENCOUNTER — Telehealth: Payer: Self-pay | Admitting: *Deleted

## 2018-05-31 NOTE — Telephone Encounter (Signed)
Called the patient and scheduled the appt for July 25th at 2:45pm

## 2018-06-15 ENCOUNTER — Ambulatory Visit: Payer: 59 | Admitting: Obstetrics

## 2018-06-20 ENCOUNTER — Telehealth: Payer: Self-pay | Admitting: *Deleted

## 2018-06-20 NOTE — Telephone Encounter (Signed)
Called and spoke with Santiago Glad from [facility] [employer] regarding the patient's appts. The patient has no show a couple of times and rescheduled couple of times. Per the doctors will are not going to attempt to reschedule the appt. Santiago Glad advised.

## 2018-12-08 ENCOUNTER — Encounter (HOSPITAL_COMMUNITY): Payer: Self-pay | Admitting: Emergency Medicine

## 2018-12-08 ENCOUNTER — Ambulatory Visit (INDEPENDENT_AMBULATORY_CARE_PROVIDER_SITE_OTHER)
Admission: EM | Admit: 2018-12-08 | Discharge: 2018-12-08 | Disposition: A | Discharge status: Home / Self Care | Payer: 59 | Source: Home / Self Care

## 2018-12-08 ENCOUNTER — Emergency Department (HOSPITAL_COMMUNITY): Payer: 59

## 2018-12-08 ENCOUNTER — Emergency Department (HOSPITAL_COMMUNITY)
Admission: EM | Admit: 2018-12-08 | Discharge: 2018-12-08 | Disposition: A | Discharge status: Home / Self Care | Payer: 59 | Attending: Emergency Medicine | Admitting: Emergency Medicine

## 2018-12-08 ENCOUNTER — Telehealth: Payer: Self-pay | Admitting: Internal Medicine

## 2018-12-08 DIAGNOSIS — Z8673 Personal history of transient ischemic attack (TIA), and cerebral infarction without residual deficits: Secondary | ICD-10-CM | POA: Diagnosis not present

## 2018-12-08 DIAGNOSIS — R072 Precordial pain: Secondary | ICD-10-CM | POA: Diagnosis not present

## 2018-12-08 DIAGNOSIS — E119 Type 2 diabetes mellitus without complications: Secondary | ICD-10-CM | POA: Diagnosis not present

## 2018-12-08 DIAGNOSIS — E785 Hyperlipidemia, unspecified: Secondary | ICD-10-CM | POA: Insufficient documentation

## 2018-12-08 DIAGNOSIS — R0789 Other chest pain: Secondary | ICD-10-CM | POA: Diagnosis not present

## 2018-12-08 DIAGNOSIS — I10 Essential (primary) hypertension: Secondary | ICD-10-CM | POA: Insufficient documentation

## 2018-12-08 DIAGNOSIS — R079 Chest pain, unspecified: Secondary | ICD-10-CM | POA: Diagnosis present

## 2018-12-08 LAB — BASIC METABOLIC PANEL
Anion gap: 10 (ref 5–15)
BUN: 12 mg/dL (ref 6–20)
CO2: 26 mmol/L (ref 22–32)
Calcium: 9.6 mg/dL (ref 8.9–10.3)
Chloride: 101 mmol/L (ref 98–111)
Creatinine, Ser: 0.89 mg/dL (ref 0.44–1.00)
GFR calc non Af Amer: >60 mL/min (ref >60)
Glucose, Bld: 247 mg/dL — ABNORMAL HIGH (ref 70–99)
Potassium: 3.6 mmol/L (ref 3.5–5.1)
Sodium: 137 mmol/L (ref 135–145)

## 2018-12-08 LAB — I-STAT BETA HCG BLOOD, ED (MC, WL, AP ONLY): I-stat hCG, quantitative: <5 m[IU]/mL (ref <5)

## 2018-12-08 LAB — CBC
HCT: 47.8 % — ABNORMAL HIGH (ref 36.0–46.0)
Hemoglobin: 16 g/dL — ABNORMAL HIGH (ref 12.0–15.0)
MCH: 28.4 pg (ref 26.0–34.0)
MCHC: 33.5 g/dL (ref 30.0–36.0)
MCV: 84.8 fL (ref 80.0–100.0)
Platelets: 246 10*3/uL (ref 150–400)
RBC: 5.64 MIL/uL — ABNORMAL HIGH (ref 3.87–5.11)
RDW: 12.6 % (ref 11.5–15.5)
WBC: 6.2 10*3/uL (ref 4.0–10.5)
nRBC: 0 % (ref 0.0–0.2)

## 2018-12-08 LAB — I-STAT TROPONIN, ED
Troponin i, poc: 0 ng/mL (ref 0.00–0.08)
Troponin i, poc: 0 ng/mL (ref 0.00–0.08)

## 2018-12-08 MED ORDER — PANTOPRAZOLE SODIUM 40 MG PO TBEC: 40.0000 mg | DELAYED_RELEASE_TABLET | Freq: Every day | ORAL | 0 refills | Status: DC

## 2018-12-08 MED ORDER — ALUM & MAG HYDROXIDE-SIMETH 200-200-20 MG/5ML PO SUSP
15.0000 mL | Freq: Once | ORAL | Status: AC
  Administered 2018-12-08: 15 mL via ORAL
  Filled 2018-12-08: qty 30

## 2018-12-08 MED ORDER — IBUPROFEN 800 MG PO TABS: 800.0000 mg | ORAL_TABLET | Freq: Three times a day (TID) | ORAL | 0 refills | Status: DC | PRN

## 2018-12-08 MED ORDER — SODIUM CHLORIDE 0.9% FLUSH: 3.0000 mL | Freq: Once | INTRAVENOUS | Status: DC

## 2018-12-08 NOTE — Discharge Instructions (Signed)

## 2018-12-08 NOTE — ED Provider Notes (Signed)
Emergency Department Provider Note   I have reviewed the triage vital signs and the nursing notes.   HISTORY  Chief Complaint Chest Pain   HPI Carolyn Holland is a 48 y.o. female with PMH of anxiety, GERD, DM, HTN, and HLD presents to the emergency department for evaluation of left-sided chest discomfort for the past 4 days.  She describes intermittent symptoms which are worse with movement.  No associated shortness of breath, nausea, vomiting.  No abdominal discomfort.  No other modifying factors.  Patient has also noticed some focal numbness over her left face.  She describes numbness just lateral to her left eye, left side of her mouth, and a spot in her left temporal scalp.  No weakness.  No numbness/tingling in the arms or legs.  She notes a strong family history of acute coronary syndrome.  Patient states that she has the sensation that something is in her chest. She has had recent negative mammogram and similar pain this time last year which prompted an ED visit.    Past Medical History:  Diagnosis Date  . Anemia 12/22/2012  . Anxiety 2012  . Bilateral anterior knee pain 2012  . Diabetes mellitus without complication ([place])   . GERD (gastroesophageal reflux disease) 2000   come and go  . Hyperlipidemia   . Hypertension 2008  . Panic attack 2012   situational related to move from [place].     Patient Active Problem List   Diagnosis Date Noted  . Palpitations 12/20/2017  . Essential hypertension 12/20/2017  . Hypercholesterolemia 12/20/2017  . Varicose veins of both lower extremities with pain 12/20/2017  . Medication management 02/11/2017  . Elevated LDL cholesterol level 02/11/2017  . Facial twitching   . Chest pain   . TIA (transient ischemic attack) 11/07/2014  . Hot flashes 12/14/2013  . Venous insufficiency 12/11/2013  . Acid reflux 10/04/2013  . Morbid obesity due to excess calories ([place]) 08/23/2013  . Atypical chest pain 06/15/2013  . Obstructive sleep  apnea 03/02/2013  . Right hip pain 01/29/2013  . Diabetes mellitus, type 2 ([place]) 12/29/2012  . Vitamin D deficiency 12/22/2012  . Anxiety   . Benign hypertension   . Bilateral anterior knee pain     Past Surgical History:  Procedure Laterality Date  . ABDOMINAL HYSTERECTOMY    . CESAREAN SECTION  2002  . laproscopic knee surgery  2011   L knee for bilateral meniscal tear. Following acute injury.   [name] PARTIAL HYSTERECTOMY  2009   Allergies Penicillins  Family History  Problem Relation Age of Onset  . Diabetes Mother   . Hypertension Mother   . Heart disease Mother 68  . Stroke Mother 40       x 2  . Alcohol abuse Mother        previous   . Drug abuse Mother        previous cocaine   . Hypertension Father     Social History Social History   Tobacco Use  . Smoking status: Never Smoker  . Smokeless tobacco: Never Used  Substance Use Topics  . Alcohol use: No    Alcohol/week: 0.0 standard drinks    Comment: occassional, social, wine  . Drug use: No    Review of Systems  Constitutional: No fever/chills Eyes: No visual changes. ENT: No sore throat. Cardiovascular: Positive chest pain. Respiratory: Denies shortness of breath. Gastrointestinal: No abdominal pain.  No nausea, no vomiting.  No diarrhea.  No constipation. Genitourinary: Negative  for dysuria. Musculoskeletal: Negative for back pain. Skin: Negative for rash. Neurological: Negative for headaches, focal weakness. Positive focal numbness in distinct over the left face.   10-point ROS otherwise negative.  ____________________________________________   PHYSICAL EXAM:  VITAL SIGNS: ED Triage Vitals [12/08/18 1443]  Enc Vitals Group     BP 140/66     Pulse Rate 60     Resp 16     Temp 98 F (36.7 C)     Temp Source Oral     SpO2 99 %   Constitutional: Alert and oriented. Well appearing and in no acute distress. Eyes: Conjunctivae are normal. PERRL.  Head: Atraumatic. Nose: No  congestion/rhinnorhea. Mouth/Throat: Mucous membranes are moist.  Neck: No stridor.  Cardiovascular: Normal rate, regular rhythm. Good peripheral circulation. Grossly normal heart sounds. Tenderness over the left anterior chest wall which causes the patient to withdrawal from pain.  Respiratory: Normal respiratory effort.  No retractions. Lungs CTAB. Gastrointestinal: Soft and nontender. No distention.  Musculoskeletal: No lower extremity tenderness nor edema. No gross deformities of extremities. Neurologic:  Normal speech and language. No gross focal neurologic deficits are appreciated.  Skin:  Skin is warm, dry and intact. No rash noted.  ____________________________________________   LABS (all labs ordered are listed, but only abnormal results are displayed)  Labs Reviewed  BASIC METABOLIC PANEL - Abnormal; Notable for the following components:      Result Value   Glucose, Bld 247 (*)    All other components within normal limits  CBC - Abnormal; Notable for the following components:   RBC 5.64 (*)    Hemoglobin 16.0 (*)    HCT 47.8 (*)    All other components within normal limits  I-STAT TROPONIN, ED  I-STAT BETA HCG BLOOD, ED ([facility], [facility], [facility] ONLY)  I-STAT TROPONIN, ED   ____________________________________________  EKG  NSR. Normal axis. Normal intervals. Narrow QRS. No ST elevation or depression. No STEMI.  ____________________________________________  RADIOLOGY  Dg Chest 2 View  Result Date: 12/08/2018 CLINICAL DATA:  Chest, left face and mandibular pain for 4 days. EXAM: CHEST - 2 VIEW COMPARISON:  PA and lateral chest 12/08/2017 and 12/03/2016. FINDINGS: The lungs are clear. Heart size is normal. There is no pneumothorax or pleural fluid. No acute or focal bony abnormality. IMPRESSION: Negative chest. Electronically Signed   By: Inge Rise M.D.   On: 12/08/2018 15:29    ____________________________________________   PROCEDURES  Procedure(s) performed:    Procedures  None ____________________________________________   INITIAL IMPRESSION / ASSESSMENT AND PLAN / ED COURSE  Pertinent labs & imaging results that were available during my care of the patient were reviewed by me and considered in my medical decision making (see chart for details).   Patient presents with atypical chest pain that is reproducible on exam. Suspect MSK etiology. Plan for repeat troponin. Initial labs are negative. CXR with no acute findings. Focal areas of numbness on the face do not correlate well with known neuro distribution. No findings on exam. Exceedingly low suspicion for CVA.   Repeat troponin is negative. Plan for outpatient PCP follow up. Discussed ED return precautions.  ____________________________________________  FINAL CLINICAL IMPRESSION(S) / ED DIAGNOSES  Final diagnoses:  Precordial chest pain     MEDICATIONS GIVEN DURING THIS VISIT:  Medications  alum & mag hydroxide-simeth (MAALOX/MYLANTA) 200-200-20 MG/5ML suspension 15 mL (15 mLs Oral Given 12/08/18 1838)     NEW OUTPATIENT MEDICATIONS STARTED DURING THIS VISIT:  Discharge Medication List as of 12/08/2018  7:26 PM    START taking these medications   Details  ibuprofen (ADVIL,MOTRIN) 800 MG tablet Take 1 tablet (800 mg total) by mouth every 8 (eight) hours as needed for moderate pain., Starting Fri 12/08/2018, Print    pantoprazole (PROTONIX) 40 MG tablet Take 1 tablet (40 mg total) by mouth daily for 30 days., Starting Fri 12/08/2018, Until Sun 01/07/2019, Print        Note:  This document was prepared using Dragon voice recognition software and may include unintentional dictation errors.  Nanda Quinton, MD Emergency Medicine    Long, Wonda [facility], MD 12/09/18 [phone]

## 2018-12-08 NOTE — Telephone Encounter (Signed)
Patient went to [facility] [place]

## 2018-12-08 NOTE — ED Triage Notes (Signed)
Pt was sent down from UC for chest pain that started 4 days ago- pt also has dizziness and left arm pain.

## 2018-12-08 NOTE — Telephone Encounter (Signed)
New message   Pt c/o Shortness Of Breath: STAT if SOB developed within the last 24 hours or pt is noticeably SOB on the phone  1. Are you currently SOB (can you hear that pt is SOB on the phone)? Yes   2. How long have you been experiencing SOB? 4 days   3. Are you SOB when sitting or when up moving around? both  4. Are you currently experiencing any other symptoms? tingling in lips, dizziness, top of arm feel like is tingling, tingling sensation on face,

## 2018-12-08 NOTE — ED Notes (Signed)
Patient was moved into room that was expecting EMS.

## 2018-12-08 NOTE — ED Triage Notes (Signed)
Pt c/o L sided facial numbness, L arm pain, chest discomfort, dizziness x4 days.Spoke with [place], [place], pt needs eval in ER. Pt pulled into triage EKG obtained and given to [facility] PA,Pt agreeable to plan, sent to ER.

## 2018-12-08 NOTE — Telephone Encounter (Signed)
Received call from patient she stated she has been having tingling on left side of face,dizziness,sob,chest discomfort for the past 4 days.Stated worse today.Advised to go to [facility].Trish notified.

## 2018-12-14 ENCOUNTER — Ambulatory Visit: Payer: 59 | Admitting: Internal Medicine

## 2019-01-12 ENCOUNTER — Other Ambulatory Visit: Payer: Self-pay

## 2019-01-12 ENCOUNTER — Emergency Department (HOSPITAL_COMMUNITY)
Admission: EM | Admit: 2019-01-12 | Discharge: 2019-01-12 | Disposition: A | Discharge status: Home / Self Care | Payer: 59 | Attending: Emergency Medicine | Admitting: Emergency Medicine

## 2019-01-12 ENCOUNTER — Encounter (HOSPITAL_COMMUNITY): Payer: Self-pay | Admitting: Emergency Medicine

## 2019-01-12 ENCOUNTER — Emergency Department (HOSPITAL_COMMUNITY): Payer: 59

## 2019-01-12 DIAGNOSIS — Z8673 Personal history of transient ischemic attack (TIA), and cerebral infarction without residual deficits: Secondary | ICD-10-CM | POA: Diagnosis not present

## 2019-01-12 DIAGNOSIS — J101 Influenza due to other identified influenza virus with other respiratory manifestations: Secondary | ICD-10-CM

## 2019-01-12 DIAGNOSIS — Z7984 Long term (current) use of oral hypoglycemic drugs: Secondary | ICD-10-CM | POA: Diagnosis not present

## 2019-01-12 DIAGNOSIS — E119 Type 2 diabetes mellitus without complications: Secondary | ICD-10-CM | POA: Insufficient documentation

## 2019-01-12 DIAGNOSIS — Z7982 Long term (current) use of aspirin: Secondary | ICD-10-CM | POA: Diagnosis not present

## 2019-01-12 DIAGNOSIS — I10 Essential (primary) hypertension: Secondary | ICD-10-CM | POA: Insufficient documentation

## 2019-01-12 DIAGNOSIS — F419 Anxiety disorder, unspecified: Secondary | ICD-10-CM | POA: Diagnosis not present

## 2019-01-12 DIAGNOSIS — R202 Paresthesia of skin: Secondary | ICD-10-CM | POA: Diagnosis present

## 2019-01-12 DIAGNOSIS — J09X9 Influenza due to identified novel influenza A virus with other manifestations: Secondary | ICD-10-CM | POA: Diagnosis not present

## 2019-01-12 DIAGNOSIS — Z79899 Other long term (current) drug therapy: Secondary | ICD-10-CM | POA: Diagnosis not present

## 2019-01-12 LAB — CBC WITH DIFFERENTIAL/PLATELET
Abs Immature Granulocytes: 0.01 10*3/uL (ref 0.00–0.07)
Basophils Absolute: 0 10*3/uL (ref 0.0–0.1)
Basophils Relative: 1 %
EOS PCT: 2 %
Eosinophils Absolute: 0.1 10*3/uL (ref 0.0–0.5)
HCT: 52.5 % — ABNORMAL HIGH (ref 36.0–46.0)
Hemoglobin: 17 g/dL — ABNORMAL HIGH (ref 12.0–15.0)
Immature Granulocytes: 0 %
Lymphocytes Relative: 38 %
Lymphs Abs: 1.7 10*3/uL (ref 0.7–4.0)
MCH: 27.4 pg (ref 26.0–34.0)
MCHC: 32.4 g/dL (ref 30.0–36.0)
MCV: 84.5 fL (ref 80.0–100.0)
MONOS PCT: 8 %
Monocytes Absolute: 0.4 10*3/uL (ref 0.1–1.0)
Neutro Abs: 2.3 10*3/uL (ref 1.7–7.7)
Neutrophils Relative %: 51 %
Platelets: 237 10*3/uL (ref 150–400)
RBC: 6.21 MIL/uL — ABNORMAL HIGH (ref 3.87–5.11)
RDW: 12.8 % (ref 11.5–15.5)
WBC: 4.5 10*3/uL (ref 4.0–10.5)
nRBC: 0 % (ref 0.0–0.2)

## 2019-01-12 LAB — COMPREHENSIVE METABOLIC PANEL
ALT: 21 U/L (ref 0–44)
ANION GAP: 9 (ref 5–15)
AST: 21 U/L (ref 15–41)
Albumin: 4 g/dL (ref 3.5–5.0)
Alkaline Phosphatase: 41 U/L (ref 38–126)
BUN: 16 mg/dL (ref 6–20)
CO2: 28 mmol/L (ref 22–32)
Calcium: 9.6 mg/dL (ref 8.9–10.3)
Chloride: 99 mmol/L (ref 98–111)
Creatinine, Ser: 0.78 mg/dL (ref 0.44–1.00)
GFR calc non Af Amer: >60 mL/min (ref >60)
Glucose, Bld: 116 mg/dL — ABNORMAL HIGH (ref 70–99)
Potassium: 3.3 mmol/L — ABNORMAL LOW (ref 3.5–5.1)
Sodium: 136 mmol/L (ref 135–145)
TOTAL PROTEIN: 7.7 g/dL (ref 6.5–8.1)
Total Bilirubin: 1 mg/dL (ref 0.3–1.2)

## 2019-01-12 LAB — SEDIMENTATION RATE: Sed Rate: 0 mm/hr (ref 0–22)

## 2019-01-12 LAB — I-STAT BETA HCG BLOOD, ED (MC, WL, AP ONLY): I-stat hCG, quantitative: <5 m[IU]/mL (ref <5)

## 2019-01-12 LAB — INFLUENZA PANEL BY PCR (TYPE A & B)
Influenza A By PCR: POSITIVE — AB
Influenza B By PCR: NEGATIVE

## 2019-01-12 LAB — C-REACTIVE PROTEIN: CRP: <0.8 mg/dL (ref <1.0)

## 2019-01-12 MED ORDER — SODIUM CHLORIDE 0.9 % IV BOLUS
500.0000 mL | Freq: Once | INTRAVENOUS | Status: AC
  Administered 2019-01-12: 500 mL via INTRAVENOUS

## 2019-01-12 NOTE — ED Triage Notes (Signed)
Pt reports left sided facial tingling with a throbbing feeling in her temples and generalized body aches that have been going on for 3 days.

## 2019-01-12 NOTE — ED Notes (Signed)
Patient verbalizes understanding of discharge instructions. Opportunity for questioning and answers were provided. Armband removed by staff, pt discharged from ED home via POV with family. 

## 2019-01-12 NOTE — Discharge Instructions (Addendum)
You have been diagnosed today with Influenza A and facial tingling.  At this time there does not appear to be the presence of an emergent medical condition, however there is always the potential for conditions to change. Please read and follow the below instructions.  Please return to the Emergency Department immediately for any new or worsening symptoms or if your symptoms do not improve within 3 days. Please be sure to follow up with your Primary Care Provider within 5 days regarding your visit today; please call their office today to schedule an appointment even if you are feeling better for a follow-up visit. You have tested positive for influenza A today.  Please avoid contact with the sick, young or elderly to avoid spreading this illness.  Please drink plenty of water and get plenty of rest over the next few days.  Additionally you may use Tylenol and ibuprofen as directed below to help with your symptoms. Please take Ibuprofen (Advil, motrin) and Tylenol (acetaminophen) to relieve your pain.  You may take up to 400 MG (2 pills) of normal strength ibuprofen every 8 hours as needed.  In between doses of ibuprofen you make take tylenol, up to 500 mg (one extra strength pill).  Do not take more than 3,000 mg tylenol in a 24 hour period.  Please check all medication labels as many medications such as pain and cold medications may contain tylenol.  Do not drink alcohol while taking these medications.  Do not take other NSAID'S while taking ibuprofen (such as aleve or naproxen).  Please take ibuprofen with food to decrease stomach upset.  Get help right away if: You develop shortness of breath or difficulty breathing. You have Vision Changes Your skin or nails turn a bluish color. You have severe pain or stiffness in your neck. You develop a sudden headache or sudden pain in your face or ear. You cannot eat or drink without vomiting. You have: Chest pain. Diarrhea. A fever. Your cough gets  worse. You produce more mucus. You feel nauseous or you vomit. Contact a doctor if: You have paresthesia that gets worse or does not go away. Your burning or prickling feeling gets worse when you walk. You have pain or cramps. You feel dizzy. You have a rash. Get help right away if you: Feel weak. Have trouble walking or moving. Have problems speaking, understanding, or seeing. Feel confused. Cannot control when you pee (urinate) or poop (have a bowel movement). Lose feeling (have numbness) after an injury. Have new weakness in an arm or leg. Pass out (faint).  Please read the additional information packets attached to your discharge summary.

## 2019-01-12 NOTE — ED Provider Notes (Signed)
[facility] Provider Note   CSN: [id number] Arrival date & time: 01/12/19  7322    History   Chief Complaint Chief Complaint  Patient presents with  . Facial tingling  . Generalized Body Aches    HPI Carolyn Holland is a 48 y.o. female presenting today with 3-day history of left-sided facial tingling, generalized body aches and subjective fever.  Patient reports a tingling sensation throughout the entire left side of her face that has been constant for the past 3 days without aggravating or alleviating factors.  She endorses intact sensation to that side and states that it "just feels weird".  Patient states that she has never had this feeling before and that is not changed since onset.  Patient has not tried any medications for her symptoms.  Additionally patient endorses generalized body aches for the past 3 days without focal tenderness.  She denies chest pain or shortness of breath.  He describes her body aches as diffuse in all of her joints at the same time mild aching worsened with movement improved with rest.  Patient endorses subjective fever however has not measured a temperature at home.  Additionally patient reports having a left-sided headache 3 days ago around her left temple that was moderate intensity lasted for a few hours and then resolved without recurrence.  Patient denies vision changes, dizziness, lightheadedness, syncope, neck pain/stiffness, difficulty swallowing, abdominal pain, nausea/vomiting, diarrhea, cough, chest pain/shortness of breath, extremity swelling/color change/pain, rash or any additional concerns today.    HPI  Past Medical History:  Diagnosis Date  . Anemia 12/22/2012  . Anxiety 2012  . Bilateral anterior knee pain 2012  . Diabetes mellitus without complication ([place])   . GERD (gastroesophageal reflux disease) 2000   come and go  . Hyperlipidemia   . Hypertension 2008  . Panic attack 2012   situational related to move from [place].     Patient Active Problem List   Diagnosis Date Noted  . Palpitations 12/20/2017  . Essential hypertension 12/20/2017  . Hypercholesterolemia 12/20/2017  . Varicose veins of both lower extremities with pain 12/20/2017  . Medication management 02/11/2017  . Elevated LDL cholesterol level 02/11/2017  . Facial twitching   . Chest pain   . TIA (transient ischemic attack) 11/07/2014  . Hot flashes 12/14/2013  . Venous insufficiency 12/11/2013  . Acid reflux 10/04/2013  . Morbid obesity due to excess calories ([place]) 08/23/2013  . Atypical chest pain 06/15/2013  . Obstructive sleep apnea 03/02/2013  . Right hip pain 01/29/2013  . Diabetes mellitus, type 2 ([place]) 12/29/2012  . Vitamin D deficiency 12/22/2012  . Anxiety   . Benign hypertension   . Bilateral anterior knee pain     Past Surgical History:  Procedure Laterality Date  . ABDOMINAL HYSTERECTOMY    . CESAREAN SECTION  2002  . laproscopic knee surgery  2011   L knee for bilateral meniscal tear. Following acute injury.   [name] PARTIAL HYSTERECTOMY  2009     OB History    Gravida  4   Para  3   Term  3   Preterm      AB  1   Living  3     SAB      TAB  1   Ectopic      Multiple      Live Births               Home Medications    Prior  to Admission medications   Medication Sig Start Date End Date Taking? Authorizing Provider  aspirin 325 MG tablet Take 325 mg by mouth daily.     [provider]  chlorthalidone (HYGROTON) 25 MG tablet Take 25 mg by mouth daily.    [provider]  Dapagliflozin-Metformin HCl ER 10-500 MG TB24 Take 1 tablet by mouth daily at 12 noon.  05/07/16   [provider]  ibuprofen (ADVIL,MOTRIN) 800 MG tablet Take 1 tablet (800 mg total) by mouth every 8 (eight) hours as needed for moderate pain. 12/08/18   Long, Wonda Olds, MD  metoprolol tartrate (LOPRESSOR) 50 MG tablet Take ONE tablet ONE HOUR prior to test.  12/20/17   Hilty, Nadean Corwin, MD  naproxen sodium (ALEVE) 220 MG tablet Take 220 mg by mouth 2 (two) times daily as needed (for pain).     [provider]  omeprazole (PRILOSEC) 40 MG capsule Take 1 capsule (40 mg total) by mouth daily. Yearly physical due in March must see Md for refills 09/29/16   Hoyt Koch, MD  pantoprazole (PROTONIX) 40 MG tablet Take 1 tablet (40 mg total) by mouth daily for 30 days. 12/08/18 01/07/19  Long, Wonda Olds, MD  rosuvastatin (CRESTOR) 20 MG tablet Take 20 mg by mouth every evening.    [provider]  XIGDUO XR 10-500 MG TB24 Take 1 tablet by mouth daily.  09/14/18   [provider]    Family History Family History  Problem Relation Age of Onset  . Diabetes Mother   . Hypertension Mother   . Heart disease Mother 71  . Stroke Mother 40       x 2  . Alcohol abuse Mother        previous   . Drug abuse Mother        previous cocaine   . Hypertension Father     Social History Social History   Tobacco Use  . Smoking status: Never Smoker  . Smokeless tobacco: Never Used  Substance Use Topics  . Alcohol use: No    Alcohol/week: 0.0 standard drinks    Comment: occassional, social, wine  . Drug use: No     Allergies   Penicillins   Review of Systems Review of Systems  Constitutional: Positive for fever.  HENT: Negative.  Negative for ear pain, facial swelling, rhinorrhea, sore throat, trouble swallowing and voice change.   Eyes: Negative.  Negative for pain and visual disturbance.  Respiratory: Negative.  Negative for cough and shortness of breath.   Cardiovascular: Negative.  Negative for chest pain.  Gastrointestinal: Negative.  Negative for abdominal pain, diarrhea, nausea and vomiting.  Musculoskeletal: Positive for arthralgias and myalgias. Negative for neck pain and neck stiffness.  Skin: Negative.  Negative for rash.  Neurological: Positive for numbness (Tingling sensation) and headaches (3 days ago and  resolved). Negative for dizziness, syncope, facial asymmetry, speech difficulty, weakness and light-headedness.  All other systems reviewed and are negative.  Physical Exam Updated Vital Signs BP 115/90 (BP Location: Right Arm)   Pulse 73   Temp 97.7 F (36.5 C) (Oral)   Resp 18   Ht '5\' 7"'$  (1.702 m)   Wt 115 kg   SpO2 100%   BMI 39.71 kg/m   Physical Exam Constitutional:      General: She is not in acute distress.    Appearance: Normal appearance. She is well-developed. She is not ill-appearing or diaphoretic.  HENT:     Head:  Normocephalic and atraumatic.     Jaw: There is normal jaw occlusion. No trismus.     Right Ear: Tympanic membrane, ear canal and external ear normal.     Left Ear: Tympanic membrane, ear canal and external ear normal.     Nose: Nose normal.     Right Sinus: No maxillary sinus tenderness or frontal sinus tenderness.     Left Sinus: No maxillary sinus tenderness or frontal sinus tenderness.     Mouth/Throat:     Lips: Pink.     Mouth: Mucous membranes are moist.     Pharynx: Oropharynx is clear. Uvula midline. No posterior oropharyngeal erythema.  Eyes:     General: Vision grossly intact. Gaze aligned appropriately.     Extraocular Movements: Extraocular movements intact.     Conjunctiva/sclera: Conjunctivae normal.     Pupils: Pupils are equal, round, and reactive to light.     Comments: Visual fields grossly intact bilaterally  Neck:     Musculoskeletal: Full passive range of motion without pain, normal range of motion and neck supple. No spinous process tenderness or muscular tenderness.     Trachea: Trachea and phonation normal. No tracheal deviation.  Cardiovascular:     Rate and Rhythm: Normal rate and regular rhythm.     Pulses: Normal pulses.          Dorsalis pedis pulses are 2+ on the right side and 2+ on the left side.       Posterior tibial pulses are 2+ on the right side and 2+ on the left side.     Heart sounds: Normal heart sounds.    Pulmonary:     Effort: Pulmonary effort is normal. No respiratory distress.     Breath sounds: Normal breath sounds and air entry. No rhonchi.  Abdominal:     General: There is no distension.     Palpations: Abdomen is soft.     Tenderness: There is no abdominal tenderness. There is no guarding or rebound.  Musculoskeletal: Normal range of motion.     Comments: No midline C/T/L spinal tenderness to palpation, no paraspinal muscle tenderness, no deformity, crepitus, or step-off noted. No sign of injury to the neck or back.  Feet:     Right foot:     Protective Sensation: 3 sites tested. 3 sites sensed.     Left foot:     Protective Sensation: 3 sites tested. 3 sites sensed.  Skin:    General: Skin is warm and dry.     Capillary Refill: Capillary refill takes less than 2 seconds.  Neurological:     Mental Status: She is alert.     GCS: GCS eye subscore is 4. GCS verbal subscore is 5. GCS motor subscore is 6.     Comments: Mental Status: Alert, oriented, thought content appropriate, able to give a coherent history. Speech fluent without evidence of aphasia. Able to follow 2 step commands without difficulty. Cranial Nerves: II: Peripheral visual fields grossly normal, pupils equal, round, reactive to light III,IV, VI: ptosis not present, extra-ocular motions intact bilaterally V,VII: smile symmetric, eyebrows raise symmetric, facial light touch sensation equal VIII: hearing grossly normal to voice X: uvula elevates symmetrically XI: bilateral shoulder shrug symmetric and strong XII: midline tongue extension without fassiculations Motor: Normal tone. 5/5 strength in upper and lower extremities bilaterally including strong and equal grip strength and dorsiflexion/plantar flexion Sensory: Sensation intact to light touch in all extremities.Negative Romberg.  Deep Tendon Reflexes: 2+ and symmetric  in the biceps and patella Cerebellar: normal finger-to-nose with bilateral upper  extremities. Normal heel-to -shin balance bilaterally of the lower extremity. No pronator drift.  Patient able to stand on one leg without difficulty. Gait: normal gait and balance CV: distal pulses palpable throughout  Psychiatric:        Mood and Affect: Mood normal.        Behavior: Behavior normal.    ED Treatments / Results  Labs (all labs ordered are listed, but only abnormal results are displayed) Labs Reviewed  CBC WITH DIFFERENTIAL/PLATELET - Abnormal; Notable for the following components:      Result Value   RBC 6.21 (*)    Hemoglobin 17.0 (*)    HCT 52.5 (*)    All other components within normal limits  COMPREHENSIVE METABOLIC PANEL - Abnormal; Notable for the following components:   Potassium 3.3 (*)    Glucose, Bld 116 (*)    All other components within normal limits  INFLUENZA PANEL BY PCR (TYPE A & B) - Abnormal; Notable for the following components:   Influenza A By PCR POSITIVE (*)    All other components within normal limits  SEDIMENTATION RATE  C-REACTIVE PROTEIN  I-STAT BETA HCG BLOOD, ED ([facility], [facility], [facility] ONLY)    EKG None  Radiology Ct Head Wo Contrast  Result Date: 01/12/2019 CLINICAL DATA:  Left-sided facial tingling. EXAM: CT HEAD WITHOUT CONTRAST TECHNIQUE: Contiguous axial images were obtained from the base of the skull through the vertex without intravenous contrast. COMPARISON:  CT scan of November 07, 2014. FINDINGS: Brain: No evidence of acute infarction, hemorrhage, hydrocephalus, extra-axial collection or mass lesion/mass effect. Vascular: No hyperdense vessel or unexpected calcification. Skull: Normal. Negative for fracture or focal lesion. Sinuses/Orbits: No acute finding. Other: None. IMPRESSION: Normal head CT. Electronically Signed   By: Marijo Conception, M.D.   On: 01/12/2019 11:30    Procedures Procedures (including critical care time)  Medications Ordered in ED Medications  sodium chloride 0.9 % bolus 500 mL (0 mLs Intravenous Stopped  01/12/19 1202)     Initial Impression / Assessment and Plan / ED Course  I have reviewed the triage vital signs and the nursing notes.  Pertinent labs & imaging results that were available during my care of the patient were reviewed by me and considered in my medical decision making (see chart for details).    48 year old female presenting for 3-day history of left-sided facial tingling, subjective fever and generalized body aches.  On arrival patient is overall well-appearing and in no acute distress.  No neuro deficits on examination.  No meningeal signs. - CT head negative Beta-hCG negative ESR negative CRP negative CBC nonacute CMP nonacute  Influenza A positive - Patient reassessed, resting comfortably no acute distress.  Repeat neuro examination reveals no deficits.  Sensation is intact on the left side of the face that she still endorses subjective tingling.  Possible that patient's tingling sensation secondary to inflammation/influenza, do not suspect stroke at this time as patient with normal head CT after greater than 3 days of symptoms.  With normal ESR and CRP and also due to patient's age do not suspect temporal arteritis.  There are no visual complaints or abnormalities on examination.  No vesicles present to the face or within the ear canal. - At this time there does not appear to be any evidence of an acute emergency medical condition and the patient appears stable for discharge with appropriate outpatient follow up. Diagnosis was discussed  with patient who verbalizes understanding of care plan and is agreeable to discharge. I have discussed return precautions with patient who verbalizes understanding of return precautions. Patient strongly encouraged to follow-up with their PCP. All questions answered.  Patient's case rediscussed with Dr. Maryan Rued who agrees with plan to discharge with OTC anti-inflammatories and primary care follow-up.   Note: Portions of this report may  have been transcribed using voice recognition software. Every effort was made to ensure accuracy; however, inadvertent computerized transcription errors may still be present. Final Clinical Impressions(s) / ED Diagnoses   Final diagnoses:  Influenza A  Facial tingling sensation    ED Discharge Orders    None       Gari Crown 01/12/19 1411    Blanchie Dessert, MD 01/12/19 1458

## 2019-01-29 ENCOUNTER — Encounter: Payer: Self-pay | Admitting: Physician Assistant

## 2019-01-29 ENCOUNTER — Other Ambulatory Visit: Payer: Self-pay

## 2019-01-29 ENCOUNTER — Ambulatory Visit (INDEPENDENT_AMBULATORY_CARE_PROVIDER_SITE_OTHER): Payer: 59 | Admitting: Physician Assistant

## 2019-01-29 VITALS — BP 116/84 | HR 78 | Ht 66.0 in | Wt 255.0 lb

## 2019-01-29 DIAGNOSIS — E876 Hypokalemia: Secondary | ICD-10-CM

## 2019-01-29 DIAGNOSIS — R002 Palpitations: Secondary | ICD-10-CM

## 2019-01-29 DIAGNOSIS — I10 Essential (primary) hypertension: Secondary | ICD-10-CM

## 2019-01-29 DIAGNOSIS — E785 Hyperlipidemia, unspecified: Secondary | ICD-10-CM

## 2019-01-29 DIAGNOSIS — Z79899 Other long term (current) drug therapy: Secondary | ICD-10-CM

## 2019-01-29 DIAGNOSIS — E119 Type 2 diabetes mellitus without complications: Secondary | ICD-10-CM

## 2019-01-29 MED ORDER — POTASSIUM CHLORIDE ER 10 MEQ PO TBCR: 10.0000 meq | EXTENDED_RELEASE_TABLET | Freq: Every day | ORAL | 3 refills | Status: DC

## 2019-01-29 NOTE — Patient Instructions (Addendum)
Medication Instructions:  START Potassium 10 MEQ daily  Your physician recommends that you continue on your current medications as directed. Please refer to the Current Medication list given to you today.  If you need a refill on your cardiac medications before your next appointment, please call your pharmacy.   Lab work: You will need to return to our office in 1 week to have labs (blood work) drawn: BMET  If you have labs (blood work) drawn today and your tests are completely normal, you will receive your results only by: [name] MyChart Message (if you have MyChart) OR . A paper copy in the mail If you have any lab test that is abnormal or we need to change your treatment, we will call you to review the results.  Testing/Procedures: Your physician has recommended that you wear a 14 DAY ZIO-PATCH monitor. The Zio patch cardiac monitor continuously records heart rhythm data for up to 14 days, this is for patients being evaluated for multiple types heart rhythms. For the first 24 hours post application, please avoid getting the Zio monitor wet in the shower or by excessive sweating during exercise. After that, feel free to carry on with regular activities. Keep soaps and lotions away from the ZIO XT Patch.  This will be placed at our [facility] location - [address], [address].        Follow-Up: At [facility], you and your health needs are our priority.  As part of our continuing mission to provide you with exceptional heart care, we have created designated Provider Care Teams.  These Care Teams include your primary Cardiologist (physician) and Advanced Practice Providers (APPs -  Physician Assistants and Nurse Practitioners) who all work together to provide you with the care you need, when you need it. . You will need a follow up appointment in 2 months with  Pixie Casino, MD.  Any Other Special Instructions Will Be Listed Below (If Applicable).

## 2019-01-29 NOTE — Progress Notes (Signed)
Cardiology Office Note    Date:  01/31/2019   ID:  Carolyn Holland, DOB 1971-05-23, MRN [id number]  PCP:  [place]  Cardiologist:  Dr. Debara Pickett  Chief Complaint  Patient presents with  . Follow-up    seen for Dr. Debara Pickett    History of Present Illness:  Carolyn Holland is a 48 y.o. female with past medical history of hypertension, hyperlipidemia, DM 2, and history of panic attack.  She had history of palpitation with associated anxiety.  She reportedly had a cardiac work-up at [facility] in [facility] several years ago and the test was normal however she was told she did have small amount of coronary artery disease.  We do not have the record of this.  She had a sleep study in 2011 which was a home study and suggested possible sleep apnea.  Last Myoview obtained on 03/04/2016 showed EF of 48%, no ischemia or infarction.  Patient was seen by Bunnie Domino, NP on 12/15/2017 for evaluation of chest discomfort.  Cardiac catheterization was recommended however she was quite anxious about the procedure.  She was seen again by Dr. Debara Pickett a few days later who recommended a coronary CT as the best with noninvasive approach.  However I do not see this being done either.  She says she got very nervous about the test.  More recently, patient was seen in the emergency room for chest pain.  She describes intermittent symptoms that are worse with movement.  It was suspected she has musculoskeletal pain.  Troponin was negative.  Chest x-ray was negative for acute issue.  She was also diagnosed with influenza A on 01/12/2019. Patient presents today complaining of occasional fluttering sensation under the left breast.  This only last a few seconds before going away.  She does not have any dizziness or blurred vision associated with the symptom.  She does not have any exertional chest tightness.  She does have some shortness of breath with heavy exertion however this is unchanged.  At  this time, I did not recommend a coronary CT given lack of anginal symptom.  I however recommended a 14-day ZIO patch.   Past Medical History:  Diagnosis Date  . Anemia 12/22/2012  . Anxiety 2012  . Bilateral anterior knee pain 2012  . Diabetes mellitus without complication ([place])   . GERD (gastroesophageal reflux disease) 2000   come and go  . Hyperlipidemia   . Hypertension 2008  . Panic attack 2012   situational related to move from [place].     Past Surgical History:  Procedure Laterality Date  . ABDOMINAL HYSTERECTOMY    . CESAREAN SECTION  2002  . laproscopic knee surgery  2011   L knee for bilateral meniscal tear. Following acute injury.   [name] PARTIAL HYSTERECTOMY  2009    Current Medications: Outpatient Medications Prior to Visit  Medication Sig Dispense Refill  . aspirin 325 MG tablet Take 325 mg by mouth daily.     . chlorthalidone (HYGROTON) 25 MG tablet Take 25 mg by mouth daily.    . Dapagliflozin-Metformin HCl ER 10-500 MG TB24 Take 1 tablet by mouth daily at 12 noon.     [name] ibuprofen (ADVIL,MOTRIN) 800 MG tablet Take 800 mg by mouth as needed.    . naproxen sodium (ALEVE) 220 MG tablet Take 220 mg by mouth 2 (two) times daily as needed (for pain).     . rosuvastatin (CRESTOR) 20 MG tablet Take 20 mg  by mouth daily.    [name] ibuprofen (ADVIL,MOTRIN) 800 MG tablet Take 1 tablet (800 mg total) by mouth every 8 (eight) hours as needed for moderate pain. 21 tablet 0  . metoprolol tartrate (LOPRESSOR) 50 MG tablet Take ONE tablet ONE HOUR prior to test. (Patient not taking: Reported on 01/29/2019) 1 tablet 0  . omeprazole (PRILOSEC) 40 MG capsule Take 1 capsule (40 mg total) by mouth daily. Yearly physical due in March must see Md for refills (Patient not taking: Reported on 01/29/2019) 90 capsule 0  . pantoprazole (PROTONIX) 40 MG tablet Take 1 tablet (40 mg total) by mouth daily for 30 days. (Patient not taking: Reported on 01/29/2019) 30 tablet 0  . rosuvastatin (CRESTOR) 20 MG  tablet Take 20 mg by mouth every evening.    [name] XIGDUO XR 10-500 MG TB24 Take 1 tablet by mouth daily.      No facility-administered medications prior to visit.      Allergies:   Penicillins   Social History   Socioeconomic History  . Marital status: Single    Spouse name: Not on file  . Number of children: 3  . Years of education: assoc. deg  . Highest education level: Not on file  Occupational History  . Occupation: Facilities manager: [employer]  Social Needs  . Financial resource strain: Not on file  . Food insecurity:    Worry: Not on file    Inability: Not on file  . Transportation needs:    Medical: Not on file    Non-medical: Not on file  Tobacco Use  . Smoking status: Never Smoker  . Smokeless tobacco: Never Used  Substance and Sexual Activity  . Alcohol use: No    Alcohol/week: 0.0 standard drinks    Comment: occassional, social, wine  . Drug use: No  . Sexual activity: Not Currently    Partners: Male    Birth control/protection: Condom, Surgical  Lifestyle  . Physical activity:    Days per week: Not on file    Minutes per session: Not on file  . Stress: Not on file  Relationships  . Social connections:    Talks on phone: Not on file    Gets together: Not on file    Attends religious service: Not on file    Active member of club or organization: Not on file    Attends meetings of clubs or organizations: Not on file    Relationship status: Not on file  Other Topics Concern  . Not on file  Social History Narrative   Live with two children 16 and 11.       epworth sleepiness scale = 3 (02/27/16)         Family History:  The patient's family history includes Alcohol abuse in her mother; Diabetes in her mother; Drug abuse in her mother; Heart disease (age of onset: 27) in her mother; Hypertension in her father and mother; Stroke (age of onset: 28) in her mother.   ROS:   Please see the history of present illness.    ROS All other  systems reviewed and are negative.   PHYSICAL EXAM:   VS:  BP 116/84   Pulse 78   Ht 5\' 6"  (1.676 m)   Wt 255 lb (115.7 kg)   BMI 41.16 kg/m    GEN: Well nourished, well developed, in no acute distress  HEENT: normal  Neck: no JVD, carotid bruits, or masses Cardiac: RRR; no murmurs,  rubs, or gallops,no edema  Respiratory:  clear to auscultation bilaterally, normal work of breathing GI: soft, nontender, nondistended, + BS MS: no deformity or atrophy  Skin: warm and dry, no rash Neuro:  Alert and Oriented x 3, Strength and sensation are intact Psych: euthymic mood, full affect  Wt Readings from Last 3 Encounters:  01/29/19 255 lb (115.7 kg)  01/12/19 253 lb 8.5 oz (115 kg)  12/20/17 253 lb 9.6 oz (115 kg)      Studies/Labs Reviewed:   EKG:  EKG is ordered today.  The ekg ordered today demonstrates normal sinus rhythm without significant ST-T wave changes  Recent Labs: 01/12/2019: ALT 21; BUN 16; Creatinine, Ser 0.78; Hemoglobin 17.0; Platelets 237; Potassium 3.3; Sodium 136   Lipid Panel    Component Value Date/Time   CHOL 200 02/12/2016 1004   TRIG 54.0 02/12/2016 1004   HDL 68.90 02/12/2016 1004   CHOLHDL 3 02/12/2016 1004   VLDL 10.8 02/12/2016 1004   LDLCALC 120 (H) 02/12/2016 1004    Additional studies/ records that were reviewed today include:   Myoview 03/04/2016 Study Highlights    Nuclear stress EF: 48%. The LV function is mildly depressed.  There was no ST segment deviation noted during stress.  This is a low risk study. There is no evidence of ischemia or previous MI.    ASSESSMENT:    1. Palpitations   2. Medication management   3. Essential hypertension   4. Hyperlipidemia, unspecified hyperlipidemia type   5. Controlled type 2 diabetes mellitus without complication, without long-term current use of insulin ([place])   6. Hypokalemia      PLAN:  In order of problems listed above:  1. Palpitation: Proceed with 14-day Zio patch.  Although  Dr. Debara Pickett recommended a coronary CT last year, she does not have any recent exertional chest discomfort.  I will hold off on ischemic work-up  2. Hypertension: Blood pressure stable on current therapy  3. Hyperlipidemia: On Crestor 20 mg daily  4. DM2:: Managed by primary care provider  5. Hypokalemia: Start potassium 10 mEq daily.  Recheck basic metabolic panel in 1 week.    Medication Adjustments/Labs and Tests Ordered: Current medicines are reviewed at length with the patient today.  Concerns regarding medicines are outlined above.  Medication changes, Labs and Tests ordered today are listed in the Patient Instructions below. Patient Instructions  Medication Instructions:  START Potassium 10 MEQ daily  Your physician recommends that you continue on your current medications as directed. Please refer to the Current Medication list given to you today.  If you need a refill on your cardiac medications before your next appointment, please call your pharmacy.   Lab work: You will need to return to our office in 1 week to have labs (blood work) drawn: BMET  If you have labs (blood work) drawn today and your tests are completely normal, you will receive your results only by: [name] MyChart Message (if you have MyChart) OR . A paper copy in the mail If you have any lab test that is abnormal or we need to change your treatment, we will call you to review the results.  Testing/Procedures: Your physician has recommended that you wear a 14 DAY ZIO-PATCH monitor. The Zio patch cardiac monitor continuously records heart rhythm data for up to 14 days, this is for patients being evaluated for multiple types heart rhythms. For the first 24 hours post application, please avoid getting the Zio monitor wet in the shower  or by excessive sweating during exercise. After that, feel free to carry on with regular activities. Keep soaps and lotions away from the ZIO XT Patch.  This will be placed at our [facility]  location - [address].        Follow-Up: At [facility], you and your health needs are our priority.  As part of our continuing mission to provide you with exceptional heart care, we have created designated Provider Care Teams.  These Care Teams include your primary Cardiologist (physician) and Advanced Practice Providers (APPs -  Physician Assistants and Nurse Practitioners) who all work together to provide you with the care you need, when you need it. . You will need a follow up appointment in 2 months with  Pixie Casino, MD.  Any Other Special Instructions Will Be Listed Below (If Applicable).       Hilbert Corrigan, [place]  01/31/2019 11:52 PM    [facility] [place], [place], [place]  [address] Phone: [phone]; Fax: [phone]

## 2019-01-31 ENCOUNTER — Encounter: Payer: Self-pay | Admitting: Physician Assistant

## 2019-03-29 ENCOUNTER — Telehealth: Payer: Self-pay | Admitting: Internal Medicine

## 2019-03-29 NOTE — Telephone Encounter (Addendum)
LMTCB to switch 5/12 appt to virtual visit and different day with Dr. Debara Pickett or APP.

## 2019-04-03 ENCOUNTER — Ambulatory Visit: Payer: 59 | Admitting: Internal Medicine

## 2019-04-03 NOTE — Telephone Encounter (Signed)
Spoke with patient to discuss appointment today. Notified patient that our practice is seeing emergency patients in office and offering e-visits either by phone or video. Advised that first available in-office appointments would be scheduled in August at the earliest. Patient wishes to cancel her visit & r/s for August. Notified her that a scheduler would be in contact. She voiced understanding

## 2019-05-22 ENCOUNTER — Emergency Department (HOSPITAL_COMMUNITY)
Admission: EM | Admit: 2019-05-22 | Discharge: 2019-05-23 | Disposition: A | Discharge status: Home / Self Care | Payer: 59 | Attending: Emergency Medicine | Admitting: Emergency Medicine

## 2019-05-22 ENCOUNTER — Other Ambulatory Visit: Payer: Self-pay

## 2019-05-22 ENCOUNTER — Encounter (HOSPITAL_COMMUNITY): Payer: Self-pay

## 2019-05-22 DIAGNOSIS — F419 Anxiety disorder, unspecified: Secondary | ICD-10-CM | POA: Diagnosis not present

## 2019-05-22 DIAGNOSIS — E785 Hyperlipidemia, unspecified: Secondary | ICD-10-CM | POA: Insufficient documentation

## 2019-05-22 DIAGNOSIS — R202 Paresthesia of skin: Secondary | ICD-10-CM | POA: Diagnosis present

## 2019-05-22 DIAGNOSIS — E109 Type 1 diabetes mellitus without complications: Secondary | ICD-10-CM | POA: Insufficient documentation

## 2019-05-22 DIAGNOSIS — Z79899 Other long term (current) drug therapy: Secondary | ICD-10-CM | POA: Insufficient documentation

## 2019-05-22 DIAGNOSIS — I10 Essential (primary) hypertension: Secondary | ICD-10-CM | POA: Diagnosis not present

## 2019-05-22 DIAGNOSIS — K219 Gastro-esophageal reflux disease without esophagitis: Secondary | ICD-10-CM | POA: Insufficient documentation

## 2019-05-22 LAB — DIFFERENTIAL
Abs Immature Granulocytes: 0.02 10*3/uL (ref 0.00–0.07)
Basophils Absolute: 0.1 10*3/uL (ref 0.0–0.1)
Basophils Relative: 1 %
Eosinophils Absolute: 0.2 10*3/uL (ref 0.0–0.5)
Eosinophils Relative: 3 %
Immature Granulocytes: 0 %
Lymphocytes Relative: 44 %
Lymphs Abs: 2.3 10*3/uL (ref 0.7–4.0)
Monocytes Absolute: 0.6 10*3/uL (ref 0.1–1.0)
Monocytes Relative: 10 %
Neutro Abs: 2.3 10*3/uL (ref 1.7–7.7)
Neutrophils Relative %: 42 %

## 2019-05-22 LAB — I-STAT CHEM 8, ED
BUN: 16 mg/dL (ref 6–20)
Calcium, Ion: 1.16 mmol/L (ref 1.15–1.40)
Chloride: 96 mmol/L — ABNORMAL LOW (ref 98–111)
Creatinine, Ser: 0.7 mg/dL (ref 0.44–1.00)
Glucose, Bld: 460 mg/dL — ABNORMAL HIGH (ref 70–99)
HCT: 44 % (ref 36.0–46.0)
Hemoglobin: 15 g/dL (ref 12.0–15.0)
Potassium: 3.3 mmol/L — ABNORMAL LOW (ref 3.5–5.1)
Sodium: 135 mmol/L (ref 135–145)
TCO2: 27 mmol/L (ref 22–32)

## 2019-05-22 LAB — COMPREHENSIVE METABOLIC PANEL
ALT: 20 U/L (ref 0–44)
AST: 19 U/L (ref 15–41)
Albumin: 3.7 g/dL (ref 3.5–5.0)
Alkaline Phosphatase: 124 U/L (ref 38–126)
Anion gap: 10 (ref 5–15)
BUN: 14 mg/dL (ref 6–20)
CO2: 26 mmol/L (ref 22–32)
Calcium: 9.4 mg/dL (ref 8.9–10.3)
Chloride: 98 mmol/L (ref 98–111)
Creatinine, Ser: 0.98 mg/dL (ref 0.44–1.00)
GFR calc Af Amer: >60 mL/min (ref >60)
GFR calc non Af Amer: >60 mL/min (ref >60)
Glucose, Bld: 454 mg/dL — ABNORMAL HIGH (ref 70–99)
Potassium: 3.3 mmol/L — ABNORMAL LOW (ref 3.5–5.1)
Sodium: 134 mmol/L — ABNORMAL LOW (ref 135–145)
Total Bilirubin: 1.2 mg/dL (ref 0.3–1.2)
Total Protein: 6.6 g/dL (ref 6.5–8.1)

## 2019-05-22 LAB — CBC
HCT: 44.3 % (ref 36.0–46.0)
Hemoglobin: 14.8 g/dL (ref 12.0–15.0)
MCH: 27.9 pg (ref 26.0–34.0)
MCHC: 33.4 g/dL (ref 30.0–36.0)
MCV: 83.6 fL (ref 80.0–100.0)
Platelets: 245 10*3/uL (ref 150–400)
RBC: 5.3 MIL/uL — ABNORMAL HIGH (ref 3.87–5.11)
RDW: 12.6 % (ref 11.5–15.5)
WBC: 5.4 10*3/uL (ref 4.0–10.5)
nRBC: 0 % (ref 0.0–0.2)

## 2019-05-22 LAB — PROTIME-INR
INR: 0.9 (ref 0.8–1.2)
Prothrombin Time: 12.3 seconds (ref 11.4–15.2)

## 2019-05-22 LAB — APTT: aPTT: 25 seconds (ref 24–36)

## 2019-05-22 MED ORDER — SODIUM CHLORIDE 0.9% FLUSH: 3.0000 mL | Freq: Once | INTRAVENOUS | Status: DC

## 2019-05-22 NOTE — ED Triage Notes (Signed)
Pt to ER w/ c/o L sided numbness to face and arm; pt states that this has been a reocurring issue since Feb 202; however, pt states that the issue is getting worse. Pt states that she woke up out of sleep at approx 8p with locked jaw on L side and tingling in face; Pt states that she has anxiety, but her dtr is a Marine scientist and advised that she come to ER

## 2019-05-23 ENCOUNTER — Other Ambulatory Visit: Payer: Self-pay

## 2019-05-23 ENCOUNTER — Emergency Department (HOSPITAL_COMMUNITY): Payer: 59

## 2019-05-23 LAB — I-STAT BETA HCG BLOOD, ED (MC, WL, AP ONLY): I-stat hCG, quantitative: <5 m[IU]/mL (ref <5)

## 2019-05-23 MED ORDER — LORAZEPAM 2 MG/ML IJ SOLN
1.0000 mg | Freq: Once | INTRAMUSCULAR | Status: AC
  Administered 2019-05-23: 1 mg via INTRAVENOUS
  Filled 2019-05-23: qty 1

## 2019-05-23 MED ORDER — POTASSIUM CHLORIDE CRYS ER 20 MEQ PO TBCR
40.0000 meq | EXTENDED_RELEASE_TABLET | Freq: Once | ORAL | Status: AC
  Administered 2019-05-23: 07:00:00 40 meq via ORAL
  Filled 2019-05-23: qty 2

## 2019-05-23 MED ORDER — GADOBUTROL 1 MMOL/ML IV SOLN
10.0000 mL | Freq: Once | INTRAVENOUS | Status: AC | PRN
  Administered 2019-05-23: 10 mL via INTRAVENOUS

## 2019-05-23 NOTE — ED Notes (Signed)
Pt discharged from ED; instructions provided; Pt encouraged to return to ED if symptoms worsen and to f/u with PCP; Pt verbalized understanding of all instructions 

## 2019-05-23 NOTE — ED Provider Notes (Signed)
  Physical Exam  BP 112/83   Pulse 85   Temp 98.2 F (36.8 C) (Oral)   Resp 18   Ht 5\' 6"  (1.676 m)   Wt 112 kg   SpO2 95%   BMI 39.87 kg/m   Physical Exam Vitals signs and nursing note reviewed.  Constitutional:      General: She is not in acute distress.    Appearance: She is well-developed. She is not diaphoretic.  HENT:     Head: Normocephalic and atraumatic.  Eyes:     General: No scleral icterus.    Conjunctiva/sclera: Conjunctivae normal.  Neck:     Musculoskeletal: Normal range of motion.  Pulmonary:     Effort: Pulmonary effort is normal. No respiratory distress.  Skin:    Findings: No rash.  Neurological:     Mental Status: She is alert.     ED Course/Procedures     Procedures  MDM  Care handed off from previous provider PA [place].  Please see their note for further detail.  Briefly, patient is a 48 year old female with a past medical history of GERD, diabetes, hypertension, hyperlipidemia who presented to the ED with a chief complaint of gradually worsening left-sided facial paresthesias since last night.  She reports associated cramping of the jaw and headache but those symptoms have resolved.  She has had intermittent sensation similar to this since February.  States that her anxiety has gotten worse. Neuro exam without deficits. Lab work is reassuring.  MRI of the brain shows a mass.  Plan is to consult neurosurgery dispo according to their recommendations.  7:33 AM Spoke to neurosurgery.  He will be down to evaluate the patient.  8:44 AM Patient evaluated by Dr. Venetia Constable.  Plan for elective surgery but no acute intervention needed at this time.  Patient agreeable to the plan.  She was given follow-up information with neurosurgery clinic.  Advised to return for worsening symptoms.   Patient is hemodynamically stable, in NAD, and able to ambulate in the ED. Evaluation does not show pathology that would require ongoing emergent intervention or  inpatient treatment. I explained the diagnosis to the patient. Pain has been managed and has no complaints prior to discharge. Patient is comfortable with above plan and is stable for discharge at this time. All questions were answered prior to disposition. Strict return precautions for returning to the ED were discussed. Encouraged follow up with PCP.   An After Visit Summary was printed and given to the patient.   Portions of this note were generated with Lobbyist. Dictation errors may occur despite best attempts at proofreading.      Delia Heady, PA-C 05/23/19 [id number]    Tegeler, Gwenyth Allegra, MD 05/23/19 [phone]

## 2019-05-23 NOTE — Consult Note (Signed)
Neurosurgery Consultation  Reason for Consult: Brain tumor Referring Physician: Pollina  CC: facial parasthesias and numbness  HPI: This is a 48 y.o. woman that presents with a few months of paroxysmal left facial numbness, tingling, and occasionally spasms of the muscles of mastication on the left. Sx do not travel into other portions of the body, no clear palliating or provoking factors, no new weakness, numbness, or parasthesias, no change in hearing or tinnitus, no facial weakness.    ROS: A 14 point ROS was performed and is negative except as noted in the HPI.   PMHx:  Past Medical History:  Diagnosis Date  . Anemia 12/22/2012  . Anxiety 2012  . Bilateral anterior knee pain 2012  . Diabetes mellitus without complication ([place])   . GERD (gastroesophageal reflux disease) 2000   come and go  . Hyperlipidemia   . Hypertension 2008  . Panic attack 2012   situational related to move from [place].    FamHx:  Family History  Problem Relation Age of Onset  . Diabetes Mother   . Hypertension Mother   . Heart disease Mother 32  . Stroke Mother 40       x 2  . Alcohol abuse Mother        previous   . Drug abuse Mother        previous cocaine   . Hypertension Father    SocHx:  reports that she has never smoked. She has never used smokeless tobacco. She reports that she does not drink alcohol or use drugs.  Exam: Vital signs in last 24 hours: Temp:  [98.2 F (36.8 C)] 98.2 F (36.8 C) (06/30 2209) Pulse Rate:  [85-86] 85 (07/01 0031) Resp:  [16-18] 18 (07/01 0031) BP: (112-114)/(82-83) 112/83 (07/01 0031) SpO2:  [95 %-96 %] 95 % (07/01 0031) Weight:  [979 kg] 112 kg (06/30 2216) General: Awake, alert, cooperative, lying in bed in NAD Head: normocephalic and atruamatic HEENT: neck supple Pulmonary: breathing room air comfortably, no evidence of increased work of breathing Cardiac: RRR Abdomen: S NT ND Extremities: warm and well perfused x4 Neuro: AOx3, PERRL, EOMI,  FS & symmetrically sensate Strength 5/5 x4, SILTx4, no drift   Assessment and Plan: 47 y.o. woman with episodic Sx of L trigeminal irritation. MRI brain personally reviewed, which shows a left sided skull base tumor, likely meningioma, extending from meckel's cave down to the porus.   -no acute neurosurgical intervention indicated at this time, lesion was not present on her 2015 scan and is therefore clearly growing and will require elective resection -no medications recommended for these attacks, AEDs/muscle relaxants will produce a lot of side effects to try and prevent these uncommon episodes -discussed with pt, she will f/u with me in clinic to discuss scheduling surgery. I gave her my card but in case she needs it, my office number is [phone]   Judith Part, MD 05/23/19 8:34 AM [facility]

## 2019-05-23 NOTE — ED Provider Notes (Signed)
[facility] EMERGENCY DEPARTMENT Provider Note   CSN: [id number] Arrival date & time: 05/22/19  2145    History   Chief Complaint Chief Complaint  Patient presents with   Numbness    L side of face;L arm    HPI Carolyn Holland is a 48 y.o. female with a hx of GERD, NIDDM, HTN, anxiety, hyperlipidemia, TIA (questionable vs anxiety in 2015) presents to the Emergency Department complaining of gradual, persistent, waxing and waning left sided facial tingling onset 8pm.  Pt reports initially this was accompanied by a cramping of the jaw and headache, but these associated symptoms have resolved. Pt reports she has been having these tingling sensations intermittently since Feb.  Initially they were happening less than 1x per month, but the frequency has increased in the last few weeks. No known aggravating or alleviating factors.  Pt reports she is always anxious, but this has increased since COVID began.  Pt denies speech changes, focal numbness in other areas, weakness, gait disturbance.  Pt reports compliance with her medications.    Record review shows TIA w/u in 2015 which was negative, including normal MRI.  Records indicate concern for possible anxiety related symptoms and pt was d/c on Zoloft. Pt reports she never took this medication.       The history is provided by the patient and medical records. No language interpreter was used.    Past Medical History:  Diagnosis Date   Anemia 12/22/2012   Anxiety 2012   Bilateral anterior knee pain 2012   Diabetes mellitus without complication ([place])    GERD (gastroesophageal reflux disease) 2000   come and go   Hyperlipidemia    Hypertension 2008   Panic attack 2012   situational related to move from [place].     Patient Active Problem List   Diagnosis Date Noted   Palpitations 12/20/2017   Essential hypertension 12/20/2017   Hypercholesterolemia 12/20/2017   Varicose veins of both lower extremities  with pain 12/20/2017   Medication management 02/11/2017   Elevated LDL cholesterol level 02/11/2017   Facial twitching    Chest pain    TIA (transient ischemic attack) 11/07/2014   Hot flashes 12/14/2013   Venous insufficiency 12/11/2013   Acid reflux 10/04/2013   Morbid obesity due to excess calories ([place]) 08/23/2013   Atypical chest pain 06/15/2013   Obstructive sleep apnea 03/02/2013   Right hip pain 01/29/2013   Diabetes mellitus, type 2 ([place]) 12/29/2012   Vitamin D deficiency 12/22/2012   Anxiety    Benign hypertension    Bilateral anterior knee pain     Past Surgical History:  Procedure Laterality Date   ABDOMINAL HYSTERECTOMY     CESAREAN SECTION  2002   laproscopic knee surgery  2011   L knee for bilateral meniscal tear. Following acute injury.    PARTIAL HYSTERECTOMY  2009     OB History    Gravida  4   Para  3   Term  3   Preterm      AB  1   Living  3     SAB      TAB  1   Ectopic      Multiple      Live Births               Home Medications    Prior to Admission medications   Medication Sig Start Date End Date Taking? Authorizing Provider  aspirin 325 MG tablet Take 325  mg by mouth daily.     [provider]  chlorthalidone (HYGROTON) 25 MG tablet Take 25 mg by mouth daily.    [provider]  Dapagliflozin-Metformin HCl ER 10-500 MG TB24 Take 1 tablet by mouth daily at 12 noon.  05/07/16   [provider]  ibuprofen (ADVIL,MOTRIN) 800 MG tablet Take 800 mg by mouth as needed.    [provider]  naproxen sodium (ALEVE) 220 MG tablet Take 220 mg by mouth 2 (two) times daily as needed (for pain).     [provider]  potassium chloride (K-DUR) 10 MEQ tablet Take 1 tablet (10 mEq total) by mouth daily. 01/29/19 04/29/19  Almyra Deforest, PA  rosuvastatin (CRESTOR) 20 MG tablet Take 20 mg by mouth daily.    [provider]    Family History Family History  Problem  Relation Age of Onset   Diabetes Mother    Hypertension Mother    Heart disease Mother 34   Stroke Mother 43       x 2   Alcohol abuse Mother        previous    Drug abuse Mother        previous cocaine    Hypertension Father     Social History Social History   Tobacco Use   Smoking status: Never Smoker   Smokeless tobacco: Never Used  Substance Use Topics   Alcohol use: No    Alcohol/week: 0.0 standard drinks    Comment: occassional, social, wine   Drug use: No     Allergies   Penicillins   Review of Systems Review of Systems  Constitutional: Negative for appetite change, diaphoresis, fatigue, fever and unexpected weight change.  HENT: Negative for mouth sores.   Eyes: Negative for visual disturbance.  Respiratory: Negative for cough, chest tightness, shortness of breath and wheezing.   Cardiovascular: Negative for chest pain.  Gastrointestinal: Negative for abdominal pain, constipation, diarrhea, nausea and vomiting.  Endocrine: Negative for polydipsia, polyphagia and polyuria.  Genitourinary: Negative for dysuria, frequency, hematuria and urgency.  Musculoskeletal: Negative for back pain and neck stiffness.  Skin: Negative for rash.  Allergic/Immunologic: Negative for immunocompromised state.  Neurological: Positive for numbness ( paresthesias). Negative for syncope, light-headedness and headaches.  Hematological: Does not bruise/bleed easily.  Psychiatric/Behavioral: Negative for sleep disturbance. The patient is not nervous/anxious.      Physical Exam Updated Vital Signs BP 112/83    Pulse 85    Temp 98.2 F (36.8 C) (Oral)    Resp 18    Ht 5\' 6"  (1.676 m)    Wt 112 kg    SpO2 95%    BMI 39.87 kg/m   Physical Exam Vitals signs and nursing note reviewed.  Constitutional:      General: She is not in acute distress.    Appearance: She is well-developed. She is not diaphoretic.  HENT:     Head: Normocephalic and atraumatic.  Eyes:      General: No scleral icterus.    Conjunctiva/sclera: Conjunctivae normal.     Pupils: Pupils are equal, round, and reactive to light.     Comments: No horizontal, vertical or rotational nystagmus  Neck:     Musculoskeletal: Normal range of motion and neck supple.     Comments: Full active and passive ROM without pain No midline or paraspinal tenderness No nuchal rigidity or meningeal signs Cardiovascular:     Rate and Rhythm: Normal rate and regular rhythm.  Pulmonary:  Effort: Pulmonary effort is normal. No respiratory distress.     Breath sounds: Normal breath sounds. No wheezing or rales.  Abdominal:     General: Bowel sounds are normal.     Palpations: Abdomen is soft.     Tenderness: There is no abdominal tenderness. There is no guarding or rebound.  Musculoskeletal: Normal range of motion.  Lymphadenopathy:     Cervical: No cervical adenopathy.  Skin:    General: Skin is warm and dry.     Findings: No rash.  Neurological:     Mental Status: She is alert and oriented to person, place, and time.     Cranial Nerves: No cranial nerve deficit.     Motor: No abnormal muscle tone.     Coordination: Coordination normal.     Comments: Mental Status:  Alert, oriented, thought content appropriate. Speech fluent without evidence of aphasia. Able to follow 2 step commands without difficulty.  Cranial Nerves:  II:  Peripheral visual fields grossly normal, pupils equal, round, reactive to light III,IV, VI: ptosis not present, extra-ocular motions intact bilaterally  V,VII: smile symmetric, facial light touch sensation equal VIII: hearing grossly normal bilaterally  IX,X: midline uvula rise  XI: bilateral shoulder shrug equal and strong XII: midline tongue extension  Motor:  5/5 in upper and lower extremities bilaterally including strong and equal grip strength and dorsiflexion/plantar flexion Sensory: Pinprick and light touch normal in all extremities.  Cerebellar: normal  finger-to-nose with bilateral upper extremities Gait: normal gait and balance CV: distal pulses palpable throughout   Psychiatric:        Behavior: Behavior normal.        Thought Content: Thought content normal.        Judgment: Judgment normal.      ED Treatments / Results  Labs (all labs ordered are listed, but only abnormal results are displayed) Labs Reviewed  CBC - Abnormal; Notable for the following components:      Result Value   RBC 5.30 (*)    All other components within normal limits  COMPREHENSIVE METABOLIC PANEL - Abnormal; Notable for the following components:   Sodium 134 (*)    Potassium 3.3 (*)    Glucose, Bld 454 (*)    All other components within normal limits  I-STAT CHEM 8, ED - Abnormal; Notable for the following components:   Potassium 3.3 (*)    Chloride 96 (*)    Glucose, Bld 460 (*)    All other components within normal limits  PROTIME-INR  APTT  DIFFERENTIAL  I-STAT BETA HCG BLOOD, ED ([facility], [facility], [facility] ONLY)  CBG MONITORING, ED      Radiology Ct Head Wo Contrast  Result Date: 05/23/2019 CLINICAL DATA:  48 y/o  F; left-sided numbness to face and arm. EXAM: CT HEAD WITHOUT CONTRAST TECHNIQUE: Contiguous axial images were obtained from the base of the skull through the vertex without intravenous contrast. COMPARISON:  01/12/2019 CT head. FINDINGS: Brain: No evidence of acute infarction, hemorrhage, hydrocephalus, extra-axial collection or mass lesion/mass effect. Stable mildly enlarged and partially empty sella turcica. Vascular: No hyperdense vessel or unexpected calcification. Skull: Normal. Negative for fracture or focal lesion. Sinuses/Orbits: No acute finding. Other: None. IMPRESSION: No acute intracranial abnormality identified. Stable partially empty sella turcica. Otherwise unremarkable CT of head. Electronically Signed   By: Kristine Garbe M.D.   On: 05/23/2019 00:14   Mr Jeri Cos WE Contrast  Result Date: 05/23/2019 CLINICAL DATA:   Numbness or tingling, paresthesia. Left-sided  numbness to face and arm. Locked left jaw with tingling in the left face. EXAM: MRI HEAD WITHOUT AND WITH CONTRAST MRI CERVICAL SPINE WITHOUT  CONTRAST TECHNIQUE: Multiplanar, multiecho pulse sequences of the brain and surrounding structures, and cervical spine, to include the craniocervical junction and cervicothoracic junction, were obtained without intravenous contrast. Brain imaging was then performed with intravenous contrast. CONTRAST:  10 cc Gadavist intravenous COMPARISON:  None. FINDINGS: MRI HEAD FINDINGS Brain: T2 isointense, avidly enhancing mass at the left CP angle which is extra-axial. The mass wraps around the petrous ridge and over rides the porous acousticus and extends into the left Meckel's cave. At the CP angle there is mild pontine mass effect. The mass measures 21 x 11 x 17 mm. No infarct, hemorrhage, hydrocephalus, or collection. Vascular: Major flow voids are preserved Skull and upper cervical spine: Negative for marrow lesion Sinuses/Orbits: Negative MRI CERVICAL SPINE FINDINGS Alignment: Reversal of cervical lordosis which is likely positional Vertebrae: No fracture, evidence of discitis, or bone lesion. Cord: Normal signal and morphology. Posterior Fossa, vertebral arteries, paraspinal tissues: Posterior fossa described above Disc levels: C4-5: Minor facet spurring. C5-6: Small central disc protrusion without cord mass effect. IMPRESSION: 21 x 11 x 17 mm extra-axial mass centered at the left CP angle with pontine mass effect and extension into the left porous acousticus and Meckel's cave. Pontine mass effect is mild. The appearance is most compatible with meningioma. No impingement in the cervical spine. Electronically Signed   By: Monte Fantasia M.D.   On: 05/23/2019 06:48   Mr Cervical Spine Wo Contrast  Result Date: 05/23/2019 CLINICAL DATA:  Numbness or tingling, paresthesia. Left-sided numbness to face and arm. Locked left jaw with  tingling in the left face. EXAM: MRI HEAD WITHOUT AND WITH CONTRAST MRI CERVICAL SPINE WITHOUT  CONTRAST TECHNIQUE: Multiplanar, multiecho pulse sequences of the brain and surrounding structures, and cervical spine, to include the craniocervical junction and cervicothoracic junction, were obtained without intravenous contrast. Brain imaging was then performed with intravenous contrast. CONTRAST:  10 cc Gadavist intravenous COMPARISON:  None. FINDINGS: MRI HEAD FINDINGS Brain: T2 isointense, avidly enhancing mass at the left CP angle which is extra-axial. The mass wraps around the petrous ridge and over rides the porous acousticus and extends into the left Meckel's cave. At the CP angle there is mild pontine mass effect. The mass measures 21 x 11 x 17 mm. No infarct, hemorrhage, hydrocephalus, or collection. Vascular: Major flow voids are preserved Skull and upper cervical spine: Negative for marrow lesion Sinuses/Orbits: Negative MRI CERVICAL SPINE FINDINGS Alignment: Reversal of cervical lordosis which is likely positional Vertebrae: No fracture, evidence of discitis, or bone lesion. Cord: Normal signal and morphology. Posterior Fossa, vertebral arteries, paraspinal tissues: Posterior fossa described above Disc levels: C4-5: Minor facet spurring. C5-6: Small central disc protrusion without cord mass effect. IMPRESSION: 21 x 11 x 17 mm extra-axial mass centered at the left CP angle with pontine mass effect and extension into the left porous acousticus and Meckel's cave. Pontine mass effect is mild. The appearance is most compatible with meningioma. No impingement in the cervical spine. Electronically Signed   By: Monte Fantasia M.D.   On: 05/23/2019 06:48    Procedures Procedures (including critical care time)  Medications Ordered in ED Medications  sodium chloride flush (NS) 0.9 % injection 3 mL (has no administration in time range)  potassium chloride SA (K-DUR) CR tablet 40 mEq (has no administration in  time range)  LORazepam (ATIVAN) injection 1  mg (1 mg Intravenous Given 05/23/19 0511)  gadobutrol (GADAVIST) 1 MMOL/ML injection 10 mL (10 mLs Intravenous Contrast Given 05/23/19 3748)     Initial Impression / Assessment and Plan / ED Course  I have reviewed the triage vital signs and the nursing notes.  Pertinent labs & imaging results that were available during my care of the patient were reviewed by me and considered in my medical decision making (see chart for details).         Patient presents with intermittent left-sided facial numbness increasing over the last several weeks.  Tonight it woke her from sleep and she had some left-sided jaw pain as well.  Per the record review, patient with TIA work-up in 2015 which was normal.  She has not had an MRI since that time.  Endings at that time were more favorable for anxiety.  Has had significant anxiety over the last several months.  CT scan without acute abnormality including no intracranial hemorrhage.  Labs are reassuring.  Mild hypokalemia replaced here in the emergency department.  I doubt this is the source of her pain as she has previously been treated with potassium for similar symptoms.  7:02 AM MRI with extra-axial mass with pontine mass effect.  Will need neurosurgical consult.  At shift change care was transferred to [facility], PA-C who will consult with neurosurgery.        Final Clinical Impressions(s) / ED Diagnoses   Final diagnoses:  Paresthesia    ED Discharge Orders    None       Loni Muse Gwenlyn Perking 05/23/19 2707    Orpah Greek, MD 05/24/19 [phone]

## 2019-05-23 NOTE — Discharge Instructions (Addendum)
Please follow-up with Dr. Colleen Can office. Return to ED if you start to have worsening symptoms, develop a fever or shortness of breath, headache or blurry vision, increasing numbness or loss of sensation.

## 2019-05-31 ENCOUNTER — Other Ambulatory Visit: Payer: Self-pay | Admitting: Neurological Surgery

## 2019-06-06 ENCOUNTER — Ambulatory Visit: Payer: 59 | Admitting: Skilled Nursing Facility1

## 2019-06-21 ENCOUNTER — Encounter (HOSPITAL_COMMUNITY): Payer: Self-pay

## 2019-06-21 ENCOUNTER — Encounter (HOSPITAL_COMMUNITY)
Admission: RE | Admit: 2019-06-21 | Discharge: 2019-06-21 | Disposition: A | Discharge status: Home / Self Care | Payer: 59 | Source: Ambulatory Visit | Attending: Neurological Surgery | Admitting: Neurological Surgery

## 2019-06-21 ENCOUNTER — Other Ambulatory Visit: Payer: Self-pay

## 2019-06-21 DIAGNOSIS — Z01812 Encounter for preprocedural laboratory examination: Secondary | ICD-10-CM | POA: Diagnosis present

## 2019-06-21 HISTORY — DX: Unspecified osteoarthritis, unspecified site: M19.90

## 2019-06-21 HISTORY — DX: Headache, unspecified: R51.9

## 2019-06-21 LAB — CBC
HCT: 49.6 % — ABNORMAL HIGH (ref 36.0–46.0)
Hemoglobin: 16.1 g/dL — ABNORMAL HIGH (ref 12.0–15.0)
MCH: 28 pg (ref 26.0–34.0)
MCHC: 32.5 g/dL (ref 30.0–36.0)
MCV: 86.1 fL (ref 80.0–100.0)
Platelets: 236 10*3/uL (ref 150–400)
RBC: 5.76 MIL/uL — ABNORMAL HIGH (ref 3.87–5.11)
RDW: 12.8 % (ref 11.5–15.5)
WBC: 6 10*3/uL (ref 4.0–10.5)
nRBC: 0 % (ref 0.0–0.2)

## 2019-06-21 LAB — TYPE AND SCREEN
ABO/RH(D): O POS
Antibody Screen: NEGATIVE

## 2019-06-21 LAB — BASIC METABOLIC PANEL
Anion gap: 10 (ref 5–15)
BUN: 12 mg/dL (ref 6–20)
CO2: 28 mmol/L (ref 22–32)
Calcium: 9.9 mg/dL (ref 8.9–10.3)
Chloride: 99 mmol/L (ref 98–111)
Creatinine, Ser: 0.91 mg/dL (ref 0.44–1.00)
GFR calc Af Amer: >60 mL/min (ref >60)
GFR calc non Af Amer: >60 mL/min (ref >60)
Glucose, Bld: 255 mg/dL — ABNORMAL HIGH (ref 70–99)
Potassium: 3.7 mmol/L (ref 3.5–5.1)
Sodium: 137 mmol/L (ref 135–145)

## 2019-06-21 LAB — ABO/RH: ABO/RH(D): O POS

## 2019-06-21 LAB — GLUCOSE, CAPILLARY: Glucose-Capillary: 247 mg/dL — ABNORMAL HIGH (ref 70–99)

## 2019-06-21 LAB — HEMOGLOBIN A1C
Hgb A1c MFr Bld: 10.8 % — ABNORMAL HIGH (ref 4.8–5.6)
Mean Plasma Glucose: 263.26 mg/dL

## 2019-06-21 NOTE — Pre-Procedure Instructions (Addendum)
Carolyn Holland  06/21/2019     Your procedure is scheduled on Monday, August 10..  Report to [facility], Main Entrance or Entrance "A" at 6:00 A.M.                Your surgery or procedure is scheduled for 8:00 AM   Call this number if you have problems the morning of surgery: [phone]  This is the number for the Pre- Surgical Desk.    For any other questions, please call [phone], Monday - Friday 8 AM - 4 PM.  DO NOT take FARXIGA on Sunday or Monday    DO NOT take Metformin Monday am.   Remember:  Do not eat or drink after midnight Sunday, August 9.  Take these medicines the morning of surgery with A SIP OF WATER :  If needed : omeprazole (PRILOSEC)  Stop/COntinue Aspirin as directed by Dr. Zada Finders         1 Week prior to surgery STOP taking Aspirin Products (Goody Powder, Excedrin Migraine), Ibuprofen (Advil), Naproxen (Aleve), Vitamins and Herbal Products (ie Fish Oil). [facility]- Preparing For Surgery  Before surgery, you can play an important role. Because skin is not sterile, your skin needs to be as free of germs as possible. You can reduce the number of germs on your skin by washing with CHG (chlorahexidine gluconate) Soap before surgery.  CHG is an antiseptic cleaner which kills germs and bonds with the skin to continue killing germs even after washing.    Oral Hygiene is also important to reduce your risk of infection.  Remember - BRUSH YOUR TEETH THE MORNING OF SURGERY WITH YOUR REGULAR TOOTHPASTE  Please do not use if you have an allergy to CHG or antibacterial soaps. If your skin becomes reddened/irritated stop using the CHG.  Do not shave (including legs and underarms) for at least 48 hours prior to first CHG shower. It is OK to shave your face.  Please follow these instructions carefully.   1. Shower the NIGHT BEFORE SURGERY and the MORNING OF SURGERY with CHG.   2. If you chose to wash your hair, wash your hair first as usual with your normal  shampoo.  3. After you shampoo,wash your face and private area with the soap you use at home, then rinse your hair and body thoroughly to remove the shampoo and soap.  4. Use CHG as you would any other liquid soap. You can apply CHG directly to the skin and wash gently with a scrungie or a clean washcloth.   5. Apply the CHG Soap to your body ONLY FROM THE NECK DOWN.  Do not use on open wounds or open sores. Avoid contact with your eyes, ears, mouth and genitals (private parts).   6. Wash thoroughly, paying special attention to the area where your surgery will be performed.  7. Thoroughly rinse your body with warm water from the neck down.  8. DO NOT shower/wash with your normal soap after using and rinsing off the CHG Soap.  9. Pat yourself dry with a CLEAN TOWEL.  10. Wear CLEAN PAJAMAS to bed the night before surgery, wear comfortable clothes the morning of surgery  11. Place CLEAN SHEETS on your bed the night of your first shower and DO NOT SLEEP WITH PETS.  Day of Surgery: Do not wear lotions, powders, or perfumes, or deodorant. Do not apply any deodorants/lotions.  Please wear clean clothes to the hospital/surgery center.   Remember to  brush your teeth WITH YOUR REGULAR TOOTHPASTE.  Do not wear jewelry, make-up or nail polish.  Do not shave 48 hours prior to surgery.    Do not bring valuables to the hospital.  [facility] is not responsible for any belongings or valuables.  Contacts, dentures or bridgework may not be worn into surgery.  Leave your suitcase in the car.  After surgery it may be brought to your room.  For patients admitted to the hospital, discharge time will be determined by your treatment team.  Patients discharged the day of surgery will not be allowed to drive home.     Please read over the following fact sheets that you were given: Pain Booklet, Coughing and Deep Breathing, Surgical Site Infections.

## 2019-06-21 NOTE — Progress Notes (Signed)
   How to Manage Your Diabetes Before and After Surgery  Why is it important to control my blood sugar before and after surgery? . Improving blood sugar levels before and after surgery helps healing and can limit problems. . A way of improving blood sugar control is eating a healthy diet by: o  Eating less sugar and carbohydrates o  Increasing activity/exercise o  Talking with your doctor about reaching your blood sugar goals . High blood sugars (greater than 180 mg/dL) can raise your risk of infections and slow your recovery, so you will need to focus on controlling your diabetes during the weeks before surgery. . Make sure that the doctor who takes care of your diabetes knows about your planned surgery including the date and location.  How do I manage my blood sugar before surgery? . Check your blood sugar at least 4 times a day, starting 2 days before surgery, to make sure that the level is not too high or low. o Check your blood sugar the morning of your surgery when you wake up and every 2 hours until you get to the Short Stay unit. . If your blood sugar is less than 70 mg/dL, you will need to treat for low blood sugar: o Do not take insulin. o Treat a low blood sugar (less than 70 mg/dL) with  cup of clear juice (cranberry or apple), 4 glucose tablets, OR glucose gel. Recheck blood sugar in 15 minutes after treatment (to make sure it is greater than 70 mg/dL). If your blood sugar is not greater than 70 mg/dL on recheck, call [phone] for further instructions. . Report your blood sugar to the short stay nurse when you get to Short Stay.  . If you are admitted to the hospital after surgery: o Your blood sugar will be checked by the staff and you will probably be given insulin after surgery (instead of oral diabetes medicines) to make sure you have good blood sugar levels. o The goal for blood sugar control after surgery is 80-180 mg/dL.   WHAT DO I DO ABOUT MY DIABETES MEDICATION?   Do Not Take Farxiga Sunday or Monday  . Do not take oral diabetes medicines (pills) the morning of surgery. [name]

## 2019-06-21 NOTE — Progress Notes (Addendum)
PCP - Dr. Dagoberto Reef, [facility] in [facility].  Cardiologist - sees Dr Debara Pickett yearly, "I saw him because I had to to have gastric by pass, I then changed my mind about having gastic bypass, but then I chaned my mind again, but then I need this surgery."  Chest x-ray - na  EKG - 01/29/2019  Stress Test - 2017  ECHO - 2015  Cardiac Cath - no  Sleep Study - yes CPAP - no- "did not need one."  LABS-CBC< CMP, T/S, A1C,   ASA-no  ERAS-no  HA1C- Fasting Blood Sugar - 200's-"It has been running in the 200's since the put me on [place]" "It runs high no matter whatt I eat.  Checks Blood Sugar __1___ times a day  Anesthesia- Please review, Glucose was 255. A1C 10.8  Pt denies having chest pain, sob, or fever at this time. All instructions explained to the pt, with a verbal understanding of the material. Pt agrees to go over the instructions while at home for a better understanding. Pt also instructed to self quarantine after being tested for COVID-19. The opportunity to ask questions was provided.

## 2019-06-22 NOTE — Anesthesia Preprocedure Evaluation (Addendum)
Anesthesia Evaluation  Patient identified by MRN, date of birth, ID band Patient awake    Reviewed: Allergy & Precautions, NPO status , Patient's Chart, lab work & pertinent test results  Airway Mallampati: II  TM Distance: >3 FB Neck ROM: Full    Dental  (+) Teeth Intact, Dental Advisory Given   Pulmonary    breath sounds clear to auscultation       Cardiovascular hypertension,  Rhythm:Regular Rate:Normal     Neuro/Psych    GI/Hepatic   Endo/Other  diabetes  Renal/GU      Musculoskeletal   Abdominal (+) + obese,   Peds  Hematology   Anesthesia Other Findings   Reproductive/Obstetrics                            Anesthesia Physical Anesthesia Plan  ASA: III  Anesthesia Plan: General   Post-op Pain Management:    Induction:   PONV Risk Score and Plan: Ondansetron and Dexamethasone  Airway Management Planned: Oral ETT  Additional Equipment: Arterial line and CVP  Intra-op Plan:   Post-operative Plan: Possible Post-op intubation/ventilation  Informed Consent: I have reviewed the patients History and Physical, chart, labs and discussed the procedure including the risks, benefits and alternatives for the proposed anesthesia with the patient or authorized representative who has indicated his/her understanding and acceptance.       Plan Discussed with: CRNA and Anesthesiologist  Anesthesia Plan Comments: (Poorly controlled DMII. A1c 10.8. Follows with endocrinologist at [facility]. Results called to Dr. Colleen Can office. Will consult diabetes coordinator.  Previous workup by cardiology for chest discomfort and palpitations. Holter 12/2017 showed no significant dysrhythmia, nuclear stress 02/2016 was nonischemic. She was seen again recently by Almyra Deforest, PA on 01/29/19 for palpitations. He did not think she needed an ischemic workup.  Zio patch was ordered but has not yet been done.   Event  monitor 12/29/17: No significant abnormal findings. Isolated PVC / sinus arrhythmia - did not correlate with symptom diary.  Nuclear stress 03/04/16:  Plan GA with art line, central line and glucomander.  Roberts Gaudy  Nuclear stress EF: 48%. The LV function is mildly depressed.  There was no ST segment deviation noted during stress.  This is a low risk study. There is no evidence of ischemia or previous MI.  TTE 07/18/14: Conclusions: 1.  Normal study: Normal cardiac chamber sizes and function; LVEF 55-60%; normal valve anatomy and function; no pericardial effusion or intracardiac mass.  No intracardiac shunts.  Normal thoracic aorta and aortic arch.  )      Anesthesia Quick Evaluation

## 2019-06-28 ENCOUNTER — Other Ambulatory Visit (HOSPITAL_COMMUNITY)
Admission: RE | Admit: 2019-06-28 | Discharge: 2019-06-28 | Disposition: A | Discharge status: Home / Self Care | Payer: 59 | Source: Ambulatory Visit | Attending: Neurological Surgery | Admitting: Neurological Surgery

## 2019-06-28 DIAGNOSIS — Z20828 Contact with and (suspected) exposure to other viral communicable diseases: Secondary | ICD-10-CM | POA: Diagnosis not present

## 2019-06-28 DIAGNOSIS — Z01812 Encounter for preprocedural laboratory examination: Secondary | ICD-10-CM | POA: Diagnosis present

## 2019-06-28 LAB — SARS CORONAVIRUS 2 (TAT 6-24 HRS): SARS Coronavirus 2: NEGATIVE

## 2019-06-29 MED ORDER — VANCOMYCIN HCL 10 G IV SOLR
1500.0000 mg | INTRAVENOUS | Status: AC
  Administered 2019-07-02: 1500 mg via INTRAVENOUS
  Filled 2019-06-29 (×2): qty 1500

## 2019-07-02 ENCOUNTER — Encounter (HOSPITAL_COMMUNITY): Payer: Self-pay

## 2019-07-02 ENCOUNTER — Inpatient Hospital Stay (HOSPITAL_COMMUNITY): Payer: 59 | Admitting: Physician Assistant

## 2019-07-02 ENCOUNTER — Encounter (HOSPITAL_COMMUNITY)
Admission: RE | Disposition: A | Discharge status: Home / Self Care | Payer: Self-pay | Source: Home / Self Care | Attending: Neurological Surgery

## 2019-07-02 ENCOUNTER — Inpatient Hospital Stay (HOSPITAL_COMMUNITY): Payer: 59

## 2019-07-02 ENCOUNTER — Inpatient Hospital Stay (HOSPITAL_COMMUNITY)
Admission: RE | Admit: 2019-07-02 | Discharge: 2019-07-05 | DRG: 025 | Disposition: A | Discharge status: Home / Self Care | Payer: 59 | Attending: Neurological Surgery | Admitting: Neurological Surgery

## 2019-07-02 ENCOUNTER — Other Ambulatory Visit: Payer: Self-pay

## 2019-07-02 DIAGNOSIS — Z811 Family history of alcohol abuse and dependence: Secondary | ICD-10-CM

## 2019-07-02 DIAGNOSIS — Z823 Family history of stroke: Secondary | ICD-10-CM | POA: Diagnosis not present

## 2019-07-02 DIAGNOSIS — Z8249 Family history of ischemic heart disease and other diseases of the circulatory system: Secondary | ICD-10-CM

## 2019-07-02 DIAGNOSIS — Z452 Encounter for adjustment and management of vascular access device: Secondary | ICD-10-CM

## 2019-07-02 DIAGNOSIS — I10 Essential (primary) hypertension: Secondary | ICD-10-CM | POA: Diagnosis present

## 2019-07-02 DIAGNOSIS — H532 Diplopia: Secondary | ICD-10-CM | POA: Diagnosis not present

## 2019-07-02 DIAGNOSIS — G935 Compression of brain: Secondary | ICD-10-CM | POA: Diagnosis present

## 2019-07-02 DIAGNOSIS — Z813 Family history of other psychoactive substance abuse and dependence: Secondary | ICD-10-CM

## 2019-07-02 DIAGNOSIS — Z9889 Other specified postprocedural states: Secondary | ICD-10-CM

## 2019-07-02 DIAGNOSIS — D496 Neoplasm of unspecified behavior of brain: Secondary | ICD-10-CM | POA: Diagnosis present

## 2019-07-02 DIAGNOSIS — Z833 Family history of diabetes mellitus: Secondary | ICD-10-CM

## 2019-07-02 DIAGNOSIS — K219 Gastro-esophageal reflux disease without esophagitis: Secondary | ICD-10-CM | POA: Diagnosis present

## 2019-07-02 DIAGNOSIS — E119 Type 2 diabetes mellitus without complications: Secondary | ICD-10-CM | POA: Diagnosis present

## 2019-07-02 DIAGNOSIS — E785 Hyperlipidemia, unspecified: Secondary | ICD-10-CM | POA: Diagnosis present

## 2019-07-02 DIAGNOSIS — D320 Benign neoplasm of cerebral meninges: Principal | ICD-10-CM | POA: Diagnosis present

## 2019-07-02 DIAGNOSIS — D329 Benign neoplasm of meninges, unspecified: Secondary | ICD-10-CM | POA: Diagnosis present

## 2019-07-02 HISTORY — PX: RETROSIGMOID CRANIECTOMY FOR TUMOR RESECTION: SHX6072

## 2019-07-02 HISTORY — PX: APPLICATION OF CRANIAL NAVIGATION: SHX6578

## 2019-07-02 LAB — CBC
HCT: 41.3 % (ref 36.0–46.0)
Hemoglobin: 13.8 g/dL (ref 12.0–15.0)
MCH: 28.4 pg (ref 26.0–34.0)
MCHC: 33.4 g/dL (ref 30.0–36.0)
MCV: 85 fL (ref 80.0–100.0)
Platelets: 207 10*3/uL (ref 150–400)
RBC: 4.86 MIL/uL (ref 3.87–5.11)
RDW: 12.8 % (ref 11.5–15.5)
WBC: 13.3 10*3/uL — ABNORMAL HIGH (ref 4.0–10.5)
nRBC: 0 % (ref 0.0–0.2)

## 2019-07-02 LAB — GLUCOSE, CAPILLARY
Glucose-Capillary: 277 mg/dL — ABNORMAL HIGH (ref 70–99)
Glucose-Capillary: 158 mg/dL — ABNORMAL HIGH (ref 70–99)
Glucose-Capillary: 255 mg/dL — ABNORMAL HIGH (ref 70–99)

## 2019-07-02 LAB — MRSA PCR SCREENING: MRSA by PCR: NEGATIVE

## 2019-07-02 LAB — CREATININE, SERUM
Creatinine, Ser: 0.68 mg/dL (ref 0.44–1.00)
GFR calc Af Amer: >60 mL/min (ref >60)
GFR calc non Af Amer: >60 mL/min (ref >60)

## 2019-07-02 SURGERY — RETROSIGMOID CRANIECTOMY FOR TUMOR RESECTION
Anesthesia: General | Site: Head

## 2019-07-02 MED ORDER — DEXTROSE 50 % IV SOLN: 25.0000 mL | INTRAVENOUS | Status: DC | PRN

## 2019-07-02 MED ORDER — INSULIN REGULAR BOLUS VIA INFUSION
0.0000 [IU] | Freq: Three times a day (TID) | INTRAVENOUS | Status: DC
  Filled 2019-07-02: qty 10

## 2019-07-02 MED ORDER — SODIUM CHLORIDE 0.9 % IV SOLN
INTRAVENOUS | Status: DC | PRN
  Administered 2019-07-02: 30 ug/min via INTRAVENOUS
  Administered 2019-07-02: 12:00:00 via INTRAVENOUS

## 2019-07-02 MED ORDER — EPHEDRINE SULFATE-NACL 50-0.9 MG/10ML-% IV SOSY
PREFILLED_SYRINGE | INTRAVENOUS | Status: DC | PRN
  Administered 2019-07-02: 5 mg via INTRAVENOUS

## 2019-07-02 MED ORDER — HYDRALAZINE HCL 20 MG/ML IJ SOLN: 10.0000 mg | INTRAMUSCULAR | Status: DC | PRN

## 2019-07-02 MED ORDER — THROMBIN 5000 UNITS EX SOLR
OROMUCOSAL | Status: DC | PRN
  Administered 2019-07-02: 5 mL via TOPICAL

## 2019-07-02 MED ORDER — ACETAMINOPHEN 325 MG PO TABS
650.0000 mg | ORAL_TABLET | ORAL | Status: DC | PRN
  Administered 2019-07-04 – 2019-07-05 (×7): 650 mg via ORAL
  Filled 2019-07-02 (×7): qty 2

## 2019-07-02 MED ORDER — INSULIN ASPART 100 UNIT/ML ~~LOC~~ SOLN
0.0000 [IU] | Freq: Every day | SUBCUTANEOUS | Status: DC
  Administered 2019-07-02: 3 [IU] via SUBCUTANEOUS

## 2019-07-02 MED ORDER — CHLORHEXIDINE GLUCONATE CLOTH 2 % EX PADS: 6.0000 | MEDICATED_PAD | Freq: Once | CUTANEOUS | Status: DC

## 2019-07-02 MED ORDER — PROMETHAZINE HCL 12.5 MG PO TABS
12.5000 mg | ORAL_TABLET | ORAL | Status: DC | PRN
  Filled 2019-07-02: qty 2

## 2019-07-02 MED ORDER — GABAPENTIN 300 MG PO CAPS
300.0000 mg | ORAL_CAPSULE | Freq: Three times a day (TID) | ORAL | Status: DC
  Administered 2019-07-02 – 2019-07-04 (×5): 300 mg via ORAL
  Filled 2019-07-02 (×5): qty 1

## 2019-07-02 MED ORDER — INSULIN ASPART 100 UNIT/ML ~~LOC~~ SOLN
0.0000 [IU] | Freq: Three times a day (TID) | SUBCUTANEOUS | Status: DC
  Administered 2019-07-02 – 2019-07-03 (×4): 4 [IU] via SUBCUTANEOUS
  Administered 2019-07-04: 3 [IU] via SUBCUTANEOUS
  Administered 2019-07-04: 7 [IU] via SUBCUTANEOUS
  Administered 2019-07-04: 3 [IU] via SUBCUTANEOUS
  Administered 2019-07-05: 09:00:00 4 [IU] via SUBCUTANEOUS

## 2019-07-02 MED ORDER — CHLORTHALIDONE 25 MG PO TABS
25.0000 mg | ORAL_TABLET | Freq: Every day | ORAL | Status: DC
  Administered 2019-07-02 – 2019-07-04 (×3): 25 mg via ORAL
  Filled 2019-07-02 (×5): qty 1

## 2019-07-02 MED ORDER — PROPOFOL 10 MG/ML IV BOLUS
INTRAVENOUS | Status: DC | PRN
  Administered 2019-07-02: 180 mg via INTRAVENOUS
  Administered 2019-07-02: 50 mg via INTRAVENOUS
  Administered 2019-07-02: 30 mg via INTRAVENOUS

## 2019-07-02 MED ORDER — FENTANYL CITRATE (PF) 100 MCG/2ML IJ SOLN
INTRAMUSCULAR | Status: AC
  Administered 2019-07-02: 50 ug via INTRAVENOUS
  Filled 2019-07-02: qty 2

## 2019-07-02 MED ORDER — MIDAZOLAM HCL 2 MG/2ML IJ SOLN
INTRAMUSCULAR | Status: AC
  Filled 2019-07-02: qty 2

## 2019-07-02 MED ORDER — CANAGLIFLOZIN 100 MG PO TABS
100.0000 mg | ORAL_TABLET | Freq: Every day | ORAL | Status: DC
  Administered 2019-07-03 – 2019-07-05 (×3): 100 mg via ORAL
  Filled 2019-07-02 (×3): qty 1

## 2019-07-02 MED ORDER — LIDOCAINE-EPINEPHRINE 1 %-1:100000 IJ SOLN
INTRAMUSCULAR | Status: DC | PRN
  Administered 2019-07-02: 7 mL

## 2019-07-02 MED ORDER — FENTANYL CITRATE (PF) 100 MCG/2ML IJ SOLN
INTRAMUSCULAR | Status: DC | PRN
  Administered 2019-07-02: 150 ug via INTRAVENOUS
  Administered 2019-07-02 (×2): 50 ug via INTRAVENOUS

## 2019-07-02 MED ORDER — LIDOCAINE 2% (20 MG/ML) 5 ML SYRINGE
INTRAMUSCULAR | Status: DC | PRN
  Administered 2019-07-02: 60 mg via INTRAVENOUS

## 2019-07-02 MED ORDER — HYDROCODONE-ACETAMINOPHEN 5-325 MG PO TABS
1.0000 | ORAL_TABLET | ORAL | Status: DC | PRN
  Administered 2019-07-02 – 2019-07-03 (×3): 1 via ORAL
  Filled 2019-07-02 (×3): qty 1

## 2019-07-02 MED ORDER — ACETAMINOPHEN 650 MG RE SUPP: 650.0000 mg | RECTAL | Status: DC | PRN

## 2019-07-02 MED ORDER — MANNITOL 20 % IV SOLN: INTRAVENOUS | Status: DC | PRN

## 2019-07-02 MED ORDER — 0.9 % SODIUM CHLORIDE (POUR BTL) OPTIME
TOPICAL | Status: DC | PRN
  Administered 2019-07-02 (×3): 1000 mL

## 2019-07-02 MED ORDER — GLYCOPYRROLATE 0.2 MG/ML IJ SOLN
INTRAMUSCULAR | Status: DC | PRN
  Administered 2019-07-02: 0.1 mg via INTRAVENOUS

## 2019-07-02 MED ORDER — MANNITOL 25 % IV SOLN
INTRAVENOUS | Status: DC | PRN
  Administered 2019-07-02: 55 g via INTRAVENOUS

## 2019-07-02 MED ORDER — LIDOCAINE-EPINEPHRINE 1 %-1:100000 IJ SOLN
INTRAMUSCULAR | Status: AC
  Filled 2019-07-02: qty 1

## 2019-07-02 MED ORDER — SUCCINYLCHOLINE CHLORIDE 20 MG/ML IJ SOLN
INTRAMUSCULAR | Status: DC | PRN
  Administered 2019-07-02: 140 mg via INTRAVENOUS

## 2019-07-02 MED ORDER — THROMBIN 5000 UNITS EX SOLR
CUTANEOUS | Status: AC
  Filled 2019-07-02: qty 5000

## 2019-07-02 MED ORDER — PROPOFOL 10 MG/ML IV BOLUS
INTRAVENOUS | Status: AC
  Filled 2019-07-02: qty 20

## 2019-07-02 MED ORDER — FENTANYL CITRATE (PF) 100 MCG/2ML IJ SOLN
25.0000 ug | INTRAMUSCULAR | Status: DC | PRN
  Administered 2019-07-02 (×3): 50 ug via INTRAVENOUS

## 2019-07-02 MED ORDER — EPHEDRINE 5 MG/ML INJ
INTRAVENOUS | Status: AC
  Filled 2019-07-02: qty 10

## 2019-07-02 MED ORDER — BACITRACIN ZINC 500 UNIT/GM EX OINT
TOPICAL_OINTMENT | CUTANEOUS | Status: DC | PRN
  Administered 2019-07-02: 1 via TOPICAL

## 2019-07-02 MED ORDER — SODIUM CHLORIDE 0.9 % IV SOLN
0.0500 ug/kg/min | INTRAVENOUS | Status: AC
  Administered 2019-07-02: 0.1 ug/kg/min via INTRAVENOUS
  Filled 2019-07-02: qty 4000

## 2019-07-02 MED ORDER — INSULIN REGULAR(HUMAN) IN NACL 100-0.9 UT/100ML-% IV SOLN
INTRAVENOUS | Status: DC
  Administered 2019-07-02: 2 [IU]/h via INTRAVENOUS
  Filled 2019-07-02: qty 100

## 2019-07-02 MED ORDER — ONDANSETRON HCL 4 MG/2ML IJ SOLN
INTRAMUSCULAR | Status: AC
  Filled 2019-07-02: qty 2

## 2019-07-02 MED ORDER — LABETALOL HCL 5 MG/ML IV SOLN
10.0000 mg | INTRAVENOUS | Status: AC | PRN
  Administered 2019-07-02 (×2): 10 mg via INTRAVENOUS

## 2019-07-02 MED ORDER — MIDAZOLAM HCL 5 MG/5ML IJ SOLN
INTRAMUSCULAR | Status: DC | PRN
  Administered 2019-07-02: 2 mg via INTRAVENOUS

## 2019-07-02 MED ORDER — OXYCODONE HCL 5 MG/5ML PO SOLN: 5.0000 mg | Freq: Once | ORAL | Status: DC | PRN

## 2019-07-02 MED ORDER — HEPARIN SODIUM (PORCINE) 5000 UNIT/ML IJ SOLN
5000.0000 [IU] | Freq: Three times a day (TID) | INTRAMUSCULAR | Status: DC
  Administered 2019-07-04 – 2019-07-05 (×4): 5000 [IU] via SUBCUTANEOUS
  Filled 2019-07-02 (×4): qty 1

## 2019-07-02 MED ORDER — LABETALOL HCL 5 MG/ML IV SOLN
INTRAVENOUS | Status: AC
  Administered 2019-07-02: 10 mg via INTRAVENOUS
  Filled 2019-07-02: qty 4

## 2019-07-02 MED ORDER — PNEUMOCOCCAL VAC POLYVALENT 25 MCG/0.5ML IJ INJ
0.5000 mL | INJECTION | INTRAMUSCULAR | Status: DC
  Filled 2019-07-02: qty 0.5

## 2019-07-02 MED ORDER — HEMOSTATIC AGENTS (NO CHARGE) OPTIME
TOPICAL | Status: DC | PRN
  Administered 2019-07-02: 1 via TOPICAL

## 2019-07-02 MED ORDER — ONDANSETRON HCL 4 MG/2ML IJ SOLN: 4.0000 mg | Freq: Once | INTRAMUSCULAR | Status: DC | PRN

## 2019-07-02 MED ORDER — SODIUM CHLORIDE 0.9 % IV SOLN
INTRAVENOUS | Status: DC | PRN
  Administered 2019-07-02 (×2): via INTRAVENOUS

## 2019-07-02 MED ORDER — SODIUM CHLORIDE 0.9 % IV SOLN
0.0500 ug/kg/min | INTRAVENOUS | Status: DC
  Filled 2019-07-02: qty 5000

## 2019-07-02 MED ORDER — HYDROMORPHONE HCL 1 MG/ML IJ SOLN
0.5000 mg | INTRAMUSCULAR | Status: DC | PRN
  Administered 2019-07-02 – 2019-07-03 (×4): 0.5 mg via INTRAVENOUS
  Filled 2019-07-02 (×4): qty 1

## 2019-07-02 MED ORDER — DOCUSATE SODIUM 100 MG PO CAPS
100.0000 mg | ORAL_CAPSULE | Freq: Two times a day (BID) | ORAL | Status: DC
  Administered 2019-07-03 – 2019-07-05 (×5): 100 mg via ORAL
  Filled 2019-07-02 (×5): qty 1

## 2019-07-02 MED ORDER — ROSUVASTATIN CALCIUM 20 MG PO TABS
20.0000 mg | ORAL_TABLET | Freq: Every evening | ORAL | Status: DC
  Administered 2019-07-02 – 2019-07-04 (×3): 20 mg via ORAL
  Filled 2019-07-02 (×4): qty 1

## 2019-07-02 MED ORDER — ONDANSETRON HCL 4 MG/2ML IJ SOLN
INTRAMUSCULAR | Status: DC | PRN
  Administered 2019-07-02: 4 mg via INTRAVENOUS

## 2019-07-02 MED ORDER — INSULIN REGULAR(HUMAN) IN NACL 100-0.9 UT/100ML-% IV SOLN: INTRAVENOUS | Status: DC

## 2019-07-02 MED ORDER — POLYETHYLENE GLYCOL 3350 17 G PO PACK: 17.0000 g | PACK | Freq: Every day | ORAL | Status: DC | PRN

## 2019-07-02 MED ORDER — OXYCODONE HCL 5 MG PO TABS: 5.0000 mg | ORAL_TABLET | Freq: Once | ORAL | Status: DC | PRN

## 2019-07-02 MED ORDER — ONDANSETRON HCL 4 MG/2ML IJ SOLN
4.0000 mg | INTRAMUSCULAR | Status: DC | PRN
  Administered 2019-07-02 – 2019-07-05 (×5): 4 mg via INTRAVENOUS
  Filled 2019-07-02 (×5): qty 2

## 2019-07-02 MED ORDER — THROMBIN 20000 UNITS EX SOLR
CUTANEOUS | Status: AC
  Filled 2019-07-02: qty 20000

## 2019-07-02 MED ORDER — GLYCOPYRROLATE PF 0.2 MG/ML IJ SOSY
PREFILLED_SYRINGE | INTRAMUSCULAR | Status: AC
  Filled 2019-07-02: qty 1

## 2019-07-02 MED ORDER — LIDOCAINE 2% (20 MG/ML) 5 ML SYRINGE
INTRAMUSCULAR | Status: AC
  Filled 2019-07-02: qty 5

## 2019-07-02 MED ORDER — PHENYLEPHRINE 40 MCG/ML (10ML) SYRINGE FOR IV PUSH (FOR BLOOD PRESSURE SUPPORT)
PREFILLED_SYRINGE | INTRAVENOUS | Status: AC
  Filled 2019-07-02: qty 10

## 2019-07-02 MED ORDER — ONDANSETRON HCL 4 MG PO TABS
4.0000 mg | ORAL_TABLET | ORAL | Status: DC | PRN
  Administered 2019-07-04: 4 mg via ORAL
  Filled 2019-07-02: qty 1

## 2019-07-02 MED ORDER — BACITRACIN ZINC 500 UNIT/GM EX OINT
TOPICAL_OINTMENT | CUTANEOUS | Status: AC
  Filled 2019-07-02: qty 28.35

## 2019-07-02 MED ORDER — SODIUM CHLORIDE 0.9 % IV SOLN
INTRAVENOUS | Status: DC
  Administered 2019-07-02 – 2019-07-03 (×2): via INTRAVENOUS

## 2019-07-02 MED ORDER — FENTANYL CITRATE (PF) 250 MCG/5ML IJ SOLN
INTRAMUSCULAR | Status: AC
  Filled 2019-07-02: qty 5

## 2019-07-02 MED ORDER — THROMBIN 20000 UNITS EX SOLR
CUTANEOUS | Status: DC | PRN
  Administered 2019-07-02: 20 mL via TOPICAL

## 2019-07-02 MED ORDER — SUCCINYLCHOLINE CHLORIDE 200 MG/10ML IV SOSY
PREFILLED_SYRINGE | INTRAVENOUS | Status: AC
  Filled 2019-07-02: qty 10

## 2019-07-02 MED ORDER — LORAZEPAM 2 MG/ML IJ SOLN
1.0000 mg | Freq: Once | INTRAMUSCULAR | Status: AC | PRN
  Administered 2019-07-03: 1 mg via INTRAVENOUS
  Filled 2019-07-02: qty 1

## 2019-07-02 SURGICAL SUPPLY — 98 items
BENZOIN TINCTURE PRP APPL 2/3 (GAUZE/BANDAGES/DRESSINGS) IMPLANT
BLADE CLIPPER SURG (BLADE) ×3 IMPLANT
BLADE SAW GIGLI 16 STRL (MISCELLANEOUS) IMPLANT
BLADE SURG 15 STRL LF DISP TIS (BLADE) IMPLANT
BLADE SURG 15 STRL SS (BLADE)
BLADE ULTRA TIP 2M (BLADE) IMPLANT
BNDG GAUZE ELAST 4 BULKY (GAUZE/BANDAGES/DRESSINGS) IMPLANT
BNDG STRETCH 4X75 STRL LF (GAUZE/BANDAGES/DRESSINGS) IMPLANT
BUR ACORN 9.0 PRECISION (BURR) ×3 IMPLANT
BUR ROUND FLUTED 4 SOFT TCH (BURR) IMPLANT
BUR SPIRAL ROUTER 2.3 (BUR) ×3 IMPLANT
CANISTER SUCT 3000ML PPV (MISCELLANEOUS) ×6 IMPLANT
CATH VENTRIC 35X38 W/TROCAR LG (CATHETERS) IMPLANT
CLIP VESOCCLUDE MED 6/CT (CLIP) IMPLANT
CONT SPEC 4OZ CLIKSEAL STRL BL (MISCELLANEOUS) ×3 IMPLANT
COVER MAYO STAND STRL (DRAPES) IMPLANT
COVER WAND RF STERILE (DRAPES) ×3 IMPLANT
DECANTER SPIKE VIAL GLASS SM (MISCELLANEOUS) ×3 IMPLANT
DRAIN SUBARACHNOID (WOUND CARE) IMPLANT
DRAPE HALF SHEET 40X57 (DRAPES) ×3 IMPLANT
DRAPE MICROSCOPE LEICA (MISCELLANEOUS) ×3 IMPLANT
DRAPE NEUROLOGICAL W/INCISE (DRAPES) ×3 IMPLANT
DRAPE STERI IOBAN 125X83 (DRAPES) IMPLANT
DRAPE SURG 17X23 STRL (DRAPES) IMPLANT
DRAPE WARM FLUID 44X44 (DRAPES) ×3 IMPLANT
DRSG ADAPTIC 3X8 NADH LF (GAUZE/BANDAGES/DRESSINGS) IMPLANT
DRSG TELFA 3X8 NADH (GAUZE/BANDAGES/DRESSINGS) IMPLANT
DURAPREP 6ML APPLICATOR 50/CS (WOUND CARE) ×3 IMPLANT
ELECT REM PT RETURN 9FT ADLT (ELECTROSURGICAL) ×3
ELECTRODE REM PT RTRN 9FT ADLT (ELECTROSURGICAL) ×2 IMPLANT
EVACUATOR 1/8 PVC DRAIN (DRAIN) IMPLANT
EVACUATOR SILICONE 100CC (DRAIN) IMPLANT
FEE INTRAOP MONITOR IMPULS NCS (MISCELLANEOUS) ×2 IMPLANT
FELT TEFLON 6X6 (MISCELLANEOUS) IMPLANT
FORCEPS BIPOLAR SPETZLER 8 1.0 (NEUROSURGERY SUPPLIES) IMPLANT
GAUZE 4X4 16PLY RFD (DISPOSABLE) IMPLANT
GAUZE SPONGE 4X4 12PLY STRL (GAUZE/BANDAGES/DRESSINGS) IMPLANT
GLOVE BIO SURGEON STRL SZ7.5 (GLOVE) ×3 IMPLANT
GLOVE BIO SURGEON STRL SZ8 (GLOVE) ×3 IMPLANT
GLOVE BIO SURGEON STRL SZ8.5 (GLOVE) ×3 IMPLANT
GLOVE BIOGEL PI IND STRL 7.0 (GLOVE) ×8 IMPLANT
GLOVE BIOGEL PI IND STRL 7.5 (GLOVE) ×4 IMPLANT
GLOVE BIOGEL PI INDICATOR 7.0 (GLOVE) ×4
GLOVE BIOGEL PI INDICATOR 7.5 (GLOVE) ×2
GLOVE ECLIPSE 7.0 STRL STRAW (GLOVE) IMPLANT
GLOVE EXAM NITRILE XL STR (GLOVE) IMPLANT
GOWN STRL REUS W/ TWL LRG LVL3 (GOWN DISPOSABLE) ×2 IMPLANT
GOWN STRL REUS W/ TWL XL LVL3 (GOWN DISPOSABLE) ×4 IMPLANT
GOWN STRL REUS W/TWL 2XL LVL3 (GOWN DISPOSABLE) IMPLANT
GOWN STRL REUS W/TWL LRG LVL3 (GOWN DISPOSABLE) ×1
GOWN STRL REUS W/TWL XL LVL3 (GOWN DISPOSABLE) ×2
GRAFT DURAGEN MATRIX 1WX1L (Tissue) ×3 IMPLANT
HEMOSTAT POWDER KIT SURGIFOAM (HEMOSTASIS) ×3 IMPLANT
HEMOSTAT SURGICEL 2X14 (HEMOSTASIS) ×3 IMPLANT
INTRAOP MONITOR FEE IMPULS NCS (MISCELLANEOUS) ×2
INTRAOP MONITOR FEE IMPULSE (MISCELLANEOUS) ×1
IV NS 1000ML (IV SOLUTION)
IV NS 1000ML BAXH (IV SOLUTION) IMPLANT
KIT BASIN OR (CUSTOM PROCEDURE TRAY) ×3 IMPLANT
KIT DRAIN CSF ACCUDRAIN (MISCELLANEOUS) IMPLANT
KIT TURNOVER KIT B (KITS) ×3 IMPLANT
KNIFE ARACHNOID DISP AM-24-S (MISCELLANEOUS) IMPLANT
MARKER SPHERE PSV REFLC 13MM (MARKER) ×12 IMPLANT
NEEDLE HYPO 25X1 1.5 SAFETY (NEEDLE) ×3 IMPLANT
NEEDLE SPNL 18GX3.5 QUINCKE PK (NEEDLE) IMPLANT
NS IRRIG 1000ML POUR BTL (IV SOLUTION) ×9 IMPLANT
PACK CRANIOTOMY CUSTOM (CUSTOM PROCEDURE TRAY) ×3 IMPLANT
PATTIES SURGICAL .25X.25 (GAUZE/BANDAGES/DRESSINGS) IMPLANT
PATTIES SURGICAL .5 X.5 (GAUZE/BANDAGES/DRESSINGS) IMPLANT
PATTIES SURGICAL .5 X3 (DISPOSABLE) IMPLANT
PATTIES SURGICAL 1/4 X 3 (GAUZE/BANDAGES/DRESSINGS) IMPLANT
PATTIES SURGICAL 1X1 (DISPOSABLE) IMPLANT
PIN MAYFIELD SKULL DISP (PIN) ×3 IMPLANT
PLATE 1.5/0.5 25MM BURR HOLE (Plate) ×3 IMPLANT
PROBE PRASS NERVE STD .05 TIP (MISCELLANEOUS) ×3 IMPLANT
RUBBERBAND STERILE (MISCELLANEOUS) ×6 IMPLANT
SCREW SELF DRILL HT 1.5/4MM (Screw) ×12 IMPLANT
SPECIMEN JAR SMALL (MISCELLANEOUS) IMPLANT
SPONGE NEURO XRAY DETECT 1X3 (DISPOSABLE) IMPLANT
SPONGE SURGIFOAM ABS GEL 100 (HEMOSTASIS) ×3 IMPLANT
STAPLER VISISTAT 35W (STAPLE) ×3 IMPLANT
SUT ETHILON 3 0 FSL (SUTURE) IMPLANT
SUT ETHILON 3 0 PS 1 (SUTURE) IMPLANT
SUT MNCRL AB 3-0 PS2 18 (SUTURE) IMPLANT
SUT MNCRL AB 4-0 PS2 18 (SUTURE) ×3 IMPLANT
SUT MON AB 3-0 SH 27 (SUTURE) ×1
SUT MON AB 3-0 SH27 (SUTURE) ×2 IMPLANT
SUT NURALON 4 0 TR CR/8 (SUTURE) ×3 IMPLANT
SUT SILK 0 TIES 10X30 (SUTURE) IMPLANT
SUT VIC AB 2-0 CP2 18 (SUTURE) ×6 IMPLANT
TIP NONSTICK .5MMX23CM (INSTRUMENTS) ×1
TIP NONSTICK .5X23 (INSTRUMENTS) ×2 IMPLANT
TOWEL GREEN STERILE (TOWEL DISPOSABLE) ×3 IMPLANT
TOWEL GREEN STERILE FF (TOWEL DISPOSABLE) ×3 IMPLANT
TRAY FOLEY MTR SLVR 16FR STAT (SET/KITS/TRAYS/PACK) ×3 IMPLANT
TUBE CONNECTING 12X1/4 (SUCTIONS) ×3 IMPLANT
UNDERPAD 30X30 (UNDERPADS AND DIAPERS) IMPLANT
WATER STERILE IRR 1000ML POUR (IV SOLUTION) ×3 IMPLANT

## 2019-07-02 NOTE — Anesthesia Procedure Notes (Signed)
Procedure Name: Intubation Date/Time: 07/02/2019 8:38 AM Performed by: Lance Coon, CRNA Pre-anesthesia Checklist: Patient identified, Emergency Drugs available, Suction available, Patient being monitored and Timeout performed Patient Re-evaluated:Patient Re-evaluated prior to induction Oxygen Delivery Method: Circle system utilized Preoxygenation: Pre-oxygenation with 100% oxygen Induction Type: IV induction Ventilation: Mask ventilation without difficulty Laryngoscope Size: Miller and 3 Grade View: Grade I Tube type: Oral Tube size: 7.0 mm Number of attempts: 1 Airway Equipment and Method: Stylet Placement Confirmation: ETT inserted through vocal cords under direct vision,  positive ETCO2 and breath sounds checked- equal and bilateral Secured at: 21 cm Tube secured with: Tape Dental Injury: Teeth and Oropharynx as per pre-operative assessment

## 2019-07-02 NOTE — Brief Op Note (Signed)
07/02/2019  1:45 PM  PATIENT:  Carolyn Holland  48 y.o. female  PRE-OPERATIVE DIAGNOSIS:  Meningioma, Cerebral  POST-OPERATIVE DIAGNOSIS:  Meningioma, Cerebral  PROCEDURE:  Procedure(s): Left Retrosigmoid craniotomy for tumor resection (Left) APPLICATION OF CRANIAL NAVIGATION (N/A)  SURGEON:  Surgeon(s) and Role:    * Judith Part, MD - Primary    * Newman Pies, MD - Assisting  PHYSICIAN ASSISTANT:   ANESTHESIA:   general  EBL:  100 mL   BLOOD ADMINISTERED:none  DRAINS: none   LOCAL MEDICATIONS USED:  LIDOCAINE   SPECIMEN:  Source of Specimen:  Left CP angle tumor  DISPOSITION OF SPECIMEN:  PATHOLOGY  COUNTS:  YES  TOURNIQUET:  * No tourniquets in log *  DICTATION: .Note written in EPIC  PLAN OF CARE: Admit to inpatient   PATIENT DISPOSITION:  PACU - hemodynamically stable.   Delay start of Pharmacological VTE agent (>24hrs) due to surgical blood loss or risk of bleeding: yes

## 2019-07-02 NOTE — Op Note (Signed)
PATIENT: Carolyn Holland  DAY OF SURGERY: 07/02/19   PRE-OPERATIVE DIAGNOSIS:  Left cerebellopontine angle mass   POST-OPERATIVE DIAGNOSIS:   Left cerebellopontine angle mass   PROCEDURE:  Left retrosigmoid craniotomy for resection of tumor, use of frameless stereotaxy and electrophysiologic facial nerve monitoring   SURGEON:  Surgeon(s) and Role:    Judith Part, MD - Primary    Newman Pies, MD - Assisting   ANESTHESIA: ETGA   BRIEF HISTORY: This is a 48 year old woman who presented with a history of left facial parasthesias and numbness. The patient was found to have a left CP angle mass that appeared more consistent with a suprameatal meningioma than schwannoma. This was discussed with the patient as well as risks, benefits, and alternatives and wished to proceed with surgical resection.   OPERATIVE DETAIL: The patient was taken to the operating room and placed on the OR table in the supine position. A formal time out was performed with two patient identifiers and confirmed the operative site. Anesthesia was induced by the anesthesia team.  The [place] head holder was applied to the head and a registration array was attached to the [place]. The head was turned and shoulder taped down to maximize the space along the operative corridor. The array was co-registered with the patient's preoperative imaging, the fit appeared to be acceptable. Using frameless stereotaxy and standard landmarks, the operative trajectory was confirmed and the incision was marked. Hair was clipped with surgical clippers over the incision and the area was then prepped and draped in a sterile fashion.  A standard retrosigmoid linear incision was placed on the left post-auricular region. Dissection was performed through the soft tissues and the location of the transverse sinus was identified using landmarks and then confirmed with stereotaxy. A craniectomy was performed to provide a maximal exposure. The dura was  opened and CSF was drained to minimize retraction. The left CP angle cistern was located and opened, the tumor was immediately visible.   The normal facial nerve was identified and protected behind a cottonoid patty. The tentorium, superior petrosal vein, lower cranial nerves, 7th-8th complex, CN5, and CN4 were all identified. The tumor was coagulated along its base to allow for internal debulking, then dissected off the brainstem and cranial nerves. The tumor was then removed piecemeal and sent to pathology for analysis. The IAC was addressed last. The facial nerve was followed along its course and confirmed with stimulation along the course. Tumor was dissected out of the IAC with a curette while protecting the facial nerve.  Hemostasis was confirmed, the resection cavity limits were confirmed with frameless stereotaxy. The dura was re-approximated, a piece of duragen was placed over the dura, a large titanium burr hole cover was secured to the skull with titanium screws. All instrument and sponge counts were correct, the incision was then closed in layers. The patient was then returned to anesthesia for emergence. No apparent complications at the completion of the procedure.   EBL:  251mL   DRAINS: none   SPECIMENS: Left CP angle tumor   Judith Part, MD 07/02/19 7:28 AM

## 2019-07-02 NOTE — Anesthesia Procedure Notes (Signed)
Central Venous Catheter Insertion Performed by: Roberts Gaudy, MD, anesthesiologist Start/End8/08/2019 8:30 AM, 07/02/2019 8:40 AM Patient location: OR. Preanesthetic checklist: patient identified, IV checked, site marked, risks and benefits discussed, surgical consent, monitors and equipment checked, pre-op evaluation, timeout performed and anesthesia consent Lidocaine 1% used for infiltration and patient sedated Hand hygiene performed  and maximum sterile barriers used  Catheter size: 8 Fr Total catheter length 16. Central line was placed.Double lumen Procedure performed using ultrasound guided technique. Ultrasound Notes:image(s) printed for medical record Attempts: 1 Following insertion, dressing applied and line sutured. Post procedure assessment: blood return through all ports  Patient tolerated the procedure well with no immediate complications.

## 2019-07-02 NOTE — H&P (Signed)
Surgical H&P Update  HPI: 48 y.o. woman with left facial numbness and parasthesias that led to the diagnosis of a left CP angle tumor, likely meningiom. No changes in health since she was last seen. Still having left sided facial parasthesias and wishes to proceed with surgery.  PMHx:  Past Medical History:  Diagnosis Date  . Anemia 12/22/2012  . Anxiety 2012  . Arthritis    RA  . Bilateral anterior knee pain 2012  . Diabetes mellitus without complication ([place])   . GERD (gastroesophageal reflux disease) 2000   come and go  . Headache    As a child  . Hyperlipidemia   . Hypertension 2008  . Panic attack 2012   situational related to move from [place].    FamHx:  Family History  Problem Relation Age of Onset  . Diabetes Mother   . Hypertension Mother   . Heart disease Mother 11  . Stroke Mother 40       x 2  . Alcohol abuse Mother        previous   . Drug abuse Mother        previous cocaine   . Hypertension Father    SocHx:  reports that she has never smoked. She has never used smokeless tobacco. She reports that she does not drink alcohol or use drugs.  Physical Exam: AOx3, PERRL, FS, +L sided facial numbness in V2, TM  Strength 5/5 x4, SILTx4  Assesment/Plan: 48 y.o. woman with left CPA tumor, here for surgical resection. Risks, benefits, and alternatives discussed and the patient would like to continue with surgery.  -will need volumetric CT from preop for brainlab -OR today -4N post-op  Judith Part, MD 07/02/19 7:20 AM

## 2019-07-02 NOTE — Progress Notes (Signed)
Neurosurgery Service Post-operative progress note  Assessment & Plan: 48 y.o. woman with CP angle mass s/p craniotomy for resection. Seen in PACU, somnolent and still recovering form anesthesia but FCx4 with apparent facial symmetry, unable to assess other cranial nerves reliably due to somnolence.   -post-op MRI today or tomorrow  -off insulin gtt, going to ICU in case it needs to be restarted -SBP<160  Judith Part  07/02/19 2:33 PM

## 2019-07-02 NOTE — Progress Notes (Signed)
Inpatient Diabetes Program Recommendations  AACE/ADA: New Consensus Statement on Inpatient Glycemic Control (2015)  Target Ranges:  Prepandial:   less than 140 mg/dL      Peak postprandial:   less than 180 mg/dL (1-2 hours)      Critically ill patients:  140 - 180 mg/dL   Lab Results  Component Value Date   GLUCAP 277 (H) 07/02/2019   HGBA1C 10.8 (H) 06/21/2019    Review of Glycemic Control  Diabetes history: DM2 Outpatient Diabetes medications: Farxiga 10 mg QHS, metformin 500 mg QD  Current orders for Inpatient glycemic control: IV insulin   HgbA1C - 10.8% - uncontrolled Sees Endo at [facility]  Inpatient Diabetes Program Recommendations:     When transitioning off IV insulin, give Novolog 0-15 units Q4H x 12H, then tidwc and hs.  Will speak with pt regarding HgbA1C in am.  Thank you. Lorenda Peck, RD, LDN, CDE Inpatient Diabetes Coordinator [phone]

## 2019-07-02 NOTE — Transfer of Care (Signed)
Immediate Anesthesia Transfer of Care Note  Patient: Carolyn Holland  Procedure(s) Performed: Left Retrosigmoid craniotomy for tumor resection (Left Head) APPLICATION OF CRANIAL NAVIGATION (N/A Head)  Patient Location: PACU  Anesthesia Type:General  Level of Consciousness: drowsy and patient cooperative  Airway & Oxygen Therapy: Patient Spontanous Breathing  Post-op Assessment: Report given to RN and Post -op Vital signs reviewed and stable  Post vital signs: Reviewed and stable  Last Vitals:  Vitals Value Taken Time  BP    Temp    Pulse 106 07/02/19 1353  Resp 37 07/02/19 1353  SpO2 91 % 07/02/19 1353  Vitals shown include unvalidated device data.  Last Pain:  Vitals:   07/02/19 0707  TempSrc:   PainSc: 9       Patients Stated Pain Goal: 2 (09/81/19 1478)  Complications: No apparent anesthesia complications

## 2019-07-02 NOTE — Anesthesia Postprocedure Evaluation (Signed)
Anesthesia Post Note  Patient: Carolyn Holland  Procedure(s) Performed: Left Retrosigmoid craniotomy for tumor resection (Left Head) APPLICATION OF CRANIAL NAVIGATION (N/A Head)     Patient location during evaluation: PACU Anesthesia Type: General Level of consciousness: awake, oriented and patient cooperative Pain management: pain level controlled Vital Signs Assessment: post-procedure vital signs reviewed and stable Respiratory status: spontaneous breathing, respiratory function stable, nonlabored ventilation and patient connected to nasal cannula oxygen Cardiovascular status: blood pressure returned to baseline Anesthetic complications: no    Last Vitals:  Vitals:   07/02/19 0620 07/02/19 0621  BP: (!) 139/91   Pulse: 77   Resp: 18   Temp:  36.7 C  SpO2: 100%     Last Pain:  Vitals:   07/02/19 0707  TempSrc:   PainSc: 9                  Dillin Lofgren COKER

## 2019-07-03 ENCOUNTER — Inpatient Hospital Stay (HOSPITAL_COMMUNITY): Payer: 59

## 2019-07-03 LAB — BASIC METABOLIC PANEL
Anion gap: 11 (ref 5–15)
BUN: 12 mg/dL (ref 6–20)
CO2: 22 mmol/L (ref 22–32)
Calcium: 8.4 mg/dL — ABNORMAL LOW (ref 8.9–10.3)
Chloride: 105 mmol/L (ref 98–111)
Creatinine, Ser: 0.66 mg/dL (ref 0.44–1.00)
GFR calc Af Amer: >60 mL/min (ref >60)
GFR calc non Af Amer: >60 mL/min (ref >60)
Glucose, Bld: 241 mg/dL — ABNORMAL HIGH (ref 70–99)
Potassium: 3.6 mmol/L (ref 3.5–5.1)
Sodium: 138 mmol/L (ref 135–145)

## 2019-07-03 LAB — POCT I-STAT 7, (LYTES, BLD GAS, ICA,H+H)
Acid-base deficit: 3 mmol/L — ABNORMAL HIGH (ref 0.0–2.0)
Bicarbonate: 23.5 mmol/L (ref 20.0–28.0)
Calcium, Ion: 1.2 mmol/L (ref 1.15–1.40)
HCT: 39 % (ref 36.0–46.0)
Hemoglobin: 13.3 g/dL (ref 12.0–15.0)
O2 Saturation: 99 %
Potassium: 3.4 mmol/L — ABNORMAL LOW (ref 3.5–5.1)
Sodium: 141 mmol/L (ref 135–145)
TCO2: 25 mmol/L (ref 22–32)
pCO2 arterial: 45.1 mmHg (ref 32.0–48.0)
pH, Arterial: 7.325 — ABNORMAL LOW (ref 7.350–7.450)
pO2, Arterial: 132 mmHg — ABNORMAL HIGH (ref 83.0–108.0)

## 2019-07-03 LAB — CBC
HCT: 43.2 % (ref 36.0–46.0)
Hemoglobin: 14 g/dL (ref 12.0–15.0)
MCH: 27.9 pg (ref 26.0–34.0)
MCHC: 32.4 g/dL (ref 30.0–36.0)
MCV: 86.1 fL (ref 80.0–100.0)
Platelets: 209 10*3/uL (ref 150–400)
RBC: 5.02 MIL/uL (ref 3.87–5.11)
RDW: 12.9 % (ref 11.5–15.5)
WBC: 10.5 10*3/uL (ref 4.0–10.5)
nRBC: 0 % (ref 0.0–0.2)

## 2019-07-03 LAB — GLUCOSE, CAPILLARY
Glucose-Capillary: 256 mg/dL — ABNORMAL HIGH (ref 70–99)
Glucose-Capillary: 273 mg/dL — ABNORMAL HIGH (ref 70–99)
Glucose-Capillary: 234 mg/dL — ABNORMAL HIGH (ref 70–99)
Glucose-Capillary: 192 mg/dL — ABNORMAL HIGH (ref 70–99)
Glucose-Capillary: 167 mg/dL — ABNORMAL HIGH (ref 70–99)
Glucose-Capillary: 196 mg/dL — ABNORMAL HIGH (ref 70–99)
Glucose-Capillary: 160 mg/dL — ABNORMAL HIGH (ref 70–99)
Glucose-Capillary: 157 mg/dL — ABNORMAL HIGH (ref 70–99)
Glucose-Capillary: 123 mg/dL — ABNORMAL HIGH (ref 70–99)

## 2019-07-03 MED ORDER — OXYCODONE HCL 5 MG PO TABS
5.0000 mg | ORAL_TABLET | ORAL | Status: DC | PRN
  Administered 2019-07-04: 5 mg via ORAL
  Filled 2019-07-03 (×3): qty 1

## 2019-07-03 MED ORDER — GADOBUTROL 1 MMOL/ML IV SOLN
10.0000 mL | Freq: Once | INTRAVENOUS | Status: AC | PRN
  Administered 2019-07-03: 06:00:00 10 mL via INTRAVENOUS

## 2019-07-03 MED ORDER — OXYCODONE HCL 5 MG PO TABS
10.0000 mg | ORAL_TABLET | ORAL | Status: DC | PRN
  Administered 2019-07-03 – 2019-07-05 (×7): 10 mg via ORAL
  Filled 2019-07-03 (×7): qty 2

## 2019-07-03 NOTE — Evaluation (Signed)
Physical Therapy Evaluation Patient Details Name: Unnamed Hino MRN: [id number] DOB: 1971-09-19 Today's Date: 07/03/2019   History of Present Illness  This 48 y.o. female admitted for resection of suprameatal meningioma.  PMH includes: panic attack, HTN, HA, DM,   Clinical Impression  Pt admitted with above. Pt c/o 10/10 headache and trying to get back into bed from chair. Dtr present. Suspect once head pain under control patient will progress well. Pt tolerate in room ambulation with mild unsteadiness requiring minA for safety. Pt will need to be able to complete stair negotiation to access bedroom. Acute PT to con't to follow to progress mobility.    Follow Up Recommendations No PT follow up;Supervision/Assistance - 24 hour    Equipment Recommendations  None recommended by PT    Recommendations for Other Services       Precautions / Restrictions Precautions Precautions: Fall Restrictions Weight Bearing Restrictions: No      Mobility  Bed Mobility Overal bed mobility: Needs Assistance Bed Mobility: Sit to Supine       Sit to supine: Supervision   General bed mobility comments: pt lifted leg to get into bed instead of sitting and then laying down  Transfers Overall transfer level: Needs assistance Equipment used: 1 person hand held assist Transfers: Sit to/from Stand;Stand Pivot Transfers Sit to Stand: Min assist         General transfer comment: pt unsteady suspect due to headache and not opening eyes all the way  Ambulation/Gait Ambulation/Gait assistance: Min assist Gait Distance (Feet): 15 Feet Assistive device: 1 person hand held assist Gait Pattern/deviations: Step-through pattern;Decreased stride length;Wide base of support Gait velocity: slow   General Gait Details: pt unsteady with lateral sway, no knee buckling, pt with minimal eye opening due to headache  Stairs            Wheelchair Mobility    Modified Rankin (Stroke Patients Only)        Balance Overall balance assessment: Mild deficits observed, not formally tested                                           Pertinent Vitals/Pain Pain Assessment: Faces Faces Pain Scale: Hurts whole lot Pain Location: headache  Pain Descriptors / Indicators: Grimacing;Headache Pain Intervention(s): Patient requesting pain meds-RN notified(returned pt to bed)    Home Living Family/patient expects to be discharged to:: Private residence Living Arrangements: Children Available Help at Discharge: Family;Available PRN/intermittently Type of Home: House Home Access: Stairs to enter Entrance Stairs-Rails: Left;Right;Can reach both Entrance Stairs-Number of Steps: 2 Home Layout: Two level;Bed/bath upstairs Home Equipment: None      Prior Function Level of Independence: Independent         Comments: Pt works full time as an Scientist, physiological at [employer]: Right    Extremity/Trunk Assessment   Upper Extremity Assessment Upper Extremity Assessment: Overall WFL for tasks assessed    Lower Extremity Assessment Lower Extremity Assessment: Overall WFL for tasks assessed    Cervical / Trunk Assessment Cervical / Trunk Assessment: Other exceptions Cervical / Trunk Exceptions: pt with recent brain surgery  Communication   Communication: Expressive difficulties(low volume )  Cognition Arousal/Alertness: Lethargic Behavior During Therapy: Flat affect Overall Cognitive Status: Difficult to assess  General Comments: pt trying to get back into bed due to headache      General Comments General comments (skin integrity, edema, etc.): VSS    Exercises     Assessment/Plan    PT Assessment Patient needs continued PT services  PT Problem List Decreased strength;Decreased activity tolerance;Decreased balance;Decreased mobility;Decreased safety awareness;Pain       PT Treatment  Interventions DME instruction;Gait training;Stair training;Functional mobility training;Therapeutic activities;Therapeutic exercise;Balance training    PT Goals (Current goals can be found in the Care Plan section)  Acute Rehab PT Goals Patient Stated Goal: headache to go away PT Goal Formulation: With patient Time For Goal Achievement: 07/17/19 Potential to Achieve Goals: Good Additional Goals Additional Goal #1: Pt to score >19 on DGI to indicate minimal falls risk.    Frequency Min 4X/week   Barriers to discharge        Co-evaluation               AM-PAC PT "6 Clicks" Mobility  Outcome Measure Help needed turning from your back to your side while in a flat bed without using bedrails?: A Little Help needed moving from lying on your back to sitting on the side of a flat bed without using bedrails?: A Little Help needed moving to and from a bed to a chair (including a wheelchair)?: A Little Help needed standing up from a chair using your arms (e.g., wheelchair or bedside chair)?: A Little Help needed to walk in hospital room?: A Little Help needed climbing 3-5 steps with a railing? : A Little 6 Click Score: 18    End of Session Equipment Utilized During Treatment: Gait belt Activity Tolerance: Patient limited by pain Patient left: in bed;with call bell/phone within reach;with family/visitor present Nurse Communication: Mobility status PT Visit Diagnosis: Unsteadiness on feet (R26.81);Muscle weakness (generalized) (M62.81);Difficulty in walking, not elsewhere classified (R26.2);Pain Pain - part of body: (headache)    Time: 0272-5366 PT Time Calculation (min) (ACUTE ONLY): 16 min   Charges:   PT Evaluation $PT Eval Moderate Complexity: 1 Mod          Kittie Plater, PT, DPT Acute Rehabilitation Services Pager #: [phone] Office #: [phone]   Berline Lopes 07/03/2019, 1:53 PM

## 2019-07-03 NOTE — Progress Notes (Signed)
Neurosurgery Service Progress Note  Subjective: No acute events overnight, no new complaints   Objective: Vitals:   07/03/19 0600 07/03/19 0700 07/03/19 0800 07/03/19 0900  BP: 125/72 126/83 134/83 130/86  Pulse: (!) 102 94 93 90  Resp: 20 18    Temp:   (!) 97.5 F (36.4 C)   TempSrc:   Oral   SpO2: 92% 98% 93% 93%  Weight:      Height:       Temp (24hrs), Avg:97.7 F (36.5 C), Min:97 F (36.1 C), Max:98.1 F (36.7 C)  CBC Latest Ref Rng & Units 07/03/2019 07/02/2019 07/02/2019  WBC 4.0 - 10.5 K/uL 10.5 13.3(H) -  Hemoglobin 12.0 - 15.0 g/dL 14.0 13.8 13.3  Hematocrit 36.0 - 46.0 % 43.2 41.3 39.0  Platelets 150 - 400 K/uL 209 207 -   BMP Latest Ref Rng & Units 07/03/2019 07/02/2019 06/21/2019  Glucose 70 - 99 mg/dL 241(H) - 255(H)  BUN 6 - 20 mg/dL 12 - 12  Creatinine 0.44 - 1.00 mg/dL 0.66 0.68 0.91  Sodium 135 - 145 mmol/L 138 141 137  Potassium 3.5 - 5.1 mmol/L 3.6 3.4(L) 3.7  Chloride 98 - 111 mmol/L 105 - 99  CO2 22 - 32 mmol/L 22 - 28  Calcium 8.9 - 10.3 mg/dL 8.4(L) - 9.9    Intake/Output Summary (Last 24 hours) at 07/03/2019 1031 Last data filed at 07/03/2019 0830 Gross per 24 hour  Intake 2522.91 ml  Output 3450 ml  Net -927.09 ml    Current Facility-Administered Medications:  .  acetaminophen (TYLENOL) tablet 650 mg, 650 mg, Oral, Q4H PRN **OR** acetaminophen (TYLENOL) suppository 650 mg, 650 mg, Rectal, Q4H PRN, Judith Part, MD .  canagliflozin [facility]) tablet 100 mg, 100 mg, Oral, QAC breakfast, Judith Part, MD, 100 mg at 07/03/19 0846 .  chlorthalidone (HYGROTON) tablet 25 mg, 25 mg, Oral, Q1400, Judith Part, MD, 25 mg at 07/02/19 1750 .  docusate sodium (COLACE) capsule 100 mg, 100 mg, Oral, BID, Ostergard, Thomas A, MD .  gabapentin (NEURONTIN) capsule 300 mg, 300 mg, Oral, TID, Judith Part, MD, 300 mg at 07/02/19 1747 .  [START ON 07/04/2019] heparin injection 5,000 Units, 5,000 Units, Subcutaneous, Q8H, Ostergard, Thomas  A, MD .  hydrALAZINE (APRESOLINE) injection 10 mg, 10 mg, Intravenous, Q2H PRN, Ostergard, Joyice Faster, MD .  HYDROcodone-acetaminophen (NORCO/VICODIN) 5-325 MG per tablet 1 tablet, 1 tablet, Oral, Q4H PRN, Judith Part, MD, 1 tablet at 07/02/19 1617 .  HYDROmorphone (DILAUDID) injection 0.5 mg, 0.5 mg, Intravenous, Q3H PRN, Judith Part, MD, 0.5 mg at 07/03/19 0520 .  insulin aspart (novoLOG) injection 0-20 Units, 0-20 Units, Subcutaneous, TID WC, Judith Part, MD, 4 Units at 07/03/19 [phone] .  insulin aspart (novoLOG) injection 0-5 Units, 0-5 Units, Subcutaneous, QHS, Judith Part, MD, 3 Units at 07/02/19 2131 .  ondansetron (ZOFRAN) tablet 4 mg, 4 mg, Oral, Q4H PRN **OR** ondansetron (ZOFRAN) injection 4 mg, 4 mg, Intravenous, Q4H PRN, Judith Part, MD, 4 mg at 07/03/19 0131 .  pneumococcal 23 valent vaccine (PNU-IMMUNE) injection 0.5 mL, 0.5 mL, Intramuscular, Tomorrow-1000, Ostergard, Thomas A, MD .  polyethylene glycol (MIRALAX / GLYCOLAX) packet 17 g, 17 g, Oral, Daily PRN, Judith Part, MD .  promethazine (PHENERGAN) tablet 12.5-25 mg, 12.5-25 mg, Oral, Q4H PRN, Judith Part, MD .  rosuvastatin (CRESTOR) tablet 20 mg, 20 mg, Oral, QPM, Judith Part, MD, 20 mg at 07/02/19 1747   Physical Exam:  Somnolent 2/2 lorazepam for MRI sedation but sitting up in a chair, gaze grossly neutral, difficult to exam CNs well given sedation, FCx4  Incision c/d/i  Assessment & Plan: 48 y.o. woman s/p rsxn of suprameatal meningioma, recovering well.  -MRI shows excellent resection, images fused from preop, shows ~96.5% resection, ~73mm residual in the lateral IAC that we can watch radiographically, no diffusion changes, sinus patent -transfer to stepdown -PT/OT, mobilize -gabapentin for facial dysesthesias last night, none since, will likely d/c gabapentin tomorrow -cont RISS, BG with moderate control, high preop A1C, creatinine at baseline  Judith Part  07/03/19 10:31 AM

## 2019-07-03 NOTE — Progress Notes (Signed)
Occupational Therapy Evaluation  Pt admitted with below listed dx, and presents to OT with the below listed deficits.  She currently requires min A for functional transfers and mod A for ADLs.  This is likely due to lethargy secondary to receiving Ativan this am prior to MRI - she had a difficult time maintaining adequate arousal.  She reports she lives with her 2 adult children and was fully independent PTA including working as an Scientist, physiological at [employer].   I anticipate she will quickly progress to mod I - I level.  Will follow acutely.   No follow up OT recommended, and no recommended DME.    07/03/19 0800  OT Visit Information  Last OT Received On 07/03/19  History of Present Illness This 48 y.o. female admitted for resection of suprameatal meningioma.  PMH includes: panic attack, HTN, HA, DM,   Precautions  Precautions Fall  Home Living  Family/patient expects to be discharged to: Private residence  Living Arrangements Children  Available Help at Discharge Family;Available PRN/intermittently  Type of Home House  Home Access Stairs to enter  Entrance Stairs-Number of Steps 2  Entrance Stairs-Rails Left;Right;Can reach both  Home Layout Two level;Bed/bath upstairs  Alternate Level Stairs-Number of Steps full flight   Barrister's clerk None  Prior Function  Level of Independence Independent  Comments Pt works full time as an Scientist, physiological at [employer] difficulties (low volume )  Pain Assessment  Pain Assessment Faces  Faces Pain Scale 6  Pain Location headache   Pain Descriptors / Indicators Grimacing;Headache  Pain Intervention(s) Monitored during session;Repositioned  Cognition  Arousal/Alertness Lethargic  Behavior During Therapy Flat affect  Overall Cognitive Status Difficult to assess  Difficult to assess due to Level of arousal  Upper Extremity Assessment   Upper Extremity Assessment Overall WFL for tasks assessed  Lower Extremity Assessment  Lower Extremity Assessment Defer to PT evaluation  ADL  Overall ADL's  Needs assistance/impaired  Eating/Feeding Set up;Sitting  Grooming Wash/dry face;Set up;Sitting  Upper Body Bathing Moderate assistance;Sitting  Lower Body Bathing Moderate assistance;Sit to/from stand  Upper Body Dressing  Moderate assistance;Sitting  Lower Body Dressing Moderate assistance;Sit to/from stand  Toilet Transfer Minimal assistance;Stand-pivot;BSC  Toileting- Clothing Manipulation and Hygiene Moderate assistance;Sit to/from stand  Functional mobility during ADLs Minimal assistance  Vision- Assessment  Additional Comments unable to assess due to lethargy   Bed Mobility  Overal bed mobility Needs Assistance  Bed Mobility Supine to Sit  Supine to sit Supervision;HOB elevated  Transfers  Overall transfer level Needs assistance  Equipment used 1 person hand held assist  Transfers Sit to/from Stand;Stand Pivot Transfers  Sit to Stand Min guard  Stand pivot transfers Min assist  General transfer comment pt unsteady - she attributes this to receiving Ativan this am for MRI   Balance  Overall balance assessment Mild deficits observed, not formally tested  OT - End of Session  Equipment Utilized During Treatment Gait belt  Activity Tolerance Patient limited by lethargy  Patient left in chair;with call bell/phone within reach;with nursing/sitter in room  Nurse Communication Mobility status  OT Assessment  OT Recommendation/Assessment Patient needs continued OT Services  OT Visit Diagnosis Unsteadiness on feet (R26.81);Pain  Pain - part of body  (headache )  OT Problem List Decreased strength;Impaired balance (sitting and/or standing);Decreased activity tolerance;Decreased cognition;Pain  OT Plan  OT Frequency (ACUTE ONLY) Min 2X/week  OT Treatment/Interventions (ACUTE ONLY)  Self-care/ADL training;DME and/or AE  instruction;Therapeutic activities;Patient/family education;Balance training  AM-PAC OT "6 Clicks" Daily Activity Outcome Measure (Version 2)  Help from another person eating meals? 3  Help from another person taking care of personal grooming? 2  Help from another person toileting, which includes using toliet, bedpan, or urinal? 2  Help from another person bathing (including washing, rinsing, drying)? 2  Help from another person to put on and taking off regular upper body clothing? 2  Help from another person to put on and taking off regular lower body clothing? 2  6 Click Score 13  OT Recommendation  Follow Up Recommendations No OT follow up;Supervision - Intermittent  OT Equipment None recommended by OT  Individuals Consulted  Consulted and Agree with Results and Recommendations Patient  Acute Rehab OT Goals  Patient Stated Goal unable to state   OT Goal Formulation Patient unable to participate in goal setting  Time For Goal Achievement 07/17/19  Potential to Achieve Goals Good  OT Time Calculation  OT Start Time (ACUTE ONLY) 0826  OT Stop Time (ACUTE ONLY) 0853  OT Time Calculation (min) 27 min  OT General Charges  $OT Visit 1 Visit  OT Evaluation  $OT Eval Moderate Complexity 1 Mod  OT Treatments  $Therapeutic Activity 8-22 mins  Written Expression  Dominant Hand Right  Lucille Passy, OTR/L [place] Pager [phone] Office [phone]

## 2019-07-04 ENCOUNTER — Encounter (HOSPITAL_COMMUNITY): Payer: Self-pay | Admitting: Neurological Surgery

## 2019-07-04 LAB — GLUCOSE, CAPILLARY
Glucose-Capillary: 130 mg/dL — ABNORMAL HIGH (ref 70–99)
Glucose-Capillary: 203 mg/dL — ABNORMAL HIGH (ref 70–99)
Glucose-Capillary: 135 mg/dL — ABNORMAL HIGH (ref 70–99)
Glucose-Capillary: 167 mg/dL — ABNORMAL HIGH (ref 70–99)

## 2019-07-04 MED ORDER — BACITRACIN ZINC 500 UNIT/GM EX OINT
1.0000 "application " | TOPICAL_OINTMENT | Freq: Two times a day (BID) | CUTANEOUS | Status: DC
  Administered 2019-07-04 – 2019-07-05 (×3): 1 via TOPICAL
  Filled 2019-07-04: qty 28.4

## 2019-07-04 NOTE — Progress Notes (Signed)
Neurosurgery Service Progress Note  Subjective: No acute events overnight, having some visual symptoms that are bothering her balance and giving her headaches when looking upwards, but denies diplopia  Objective: Vitals:   07/03/19 1953 07/04/19 0015 07/04/19 0326 07/04/19 0757  BP: 113/78 132/67 137/89 (!) 134/94  Pulse: 91 96 89 87  Resp:   (!) 29 18  Temp: 99.3 F (37.4 C) 98.7 F (37.1 C) 98.2 F (36.8 C) 98.1 F (36.7 C)  TempSrc: Oral Oral Oral Oral  SpO2: 98% 97% 97% 97%  Weight:      Height:       Temp (24hrs), Avg:98.4 F (36.9 C), Min:98.1 F (36.7 C), Max:99.3 F (37.4 C)  CBC Latest Ref Rng & Units 07/03/2019 07/02/2019 07/02/2019  WBC 4.0 - 10.5 K/uL 10.5 13.3(H) -  Hemoglobin 12.0 - 15.0 g/dL 14.0 13.8 13.3  Hematocrit 36.0 - 46.0 % 43.2 41.3 39.0  Platelets 150 - 400 K/uL 209 207 -   BMP Latest Ref Rng & Units 07/03/2019 07/02/2019 06/21/2019  Glucose 70 - 99 mg/dL 241(H) - 255(H)  BUN 6 - 20 mg/dL 12 - 12  Creatinine 0.44 - 1.00 mg/dL 0.66 0.68 0.91  Sodium 135 - 145 mmol/L 138 141 137  Potassium 3.5 - 5.1 mmol/L 3.6 3.4(L) 3.7  Chloride 98 - 111 mmol/L 105 - 99  CO2 22 - 32 mmol/L 22 - 28  Calcium 8.9 - 10.3 mg/dL 8.4(L) - 9.9    Intake/Output Summary (Last 24 hours) at 07/04/2019 0900 Last data filed at 07/04/2019 0400 Gross per 24 hour  Intake 200 ml  Output -  Net 200 ml    Current Facility-Administered Medications:  .  acetaminophen (TYLENOL) tablet 650 mg, 650 mg, Oral, Q4H PRN, 650 mg at 07/04/19 0804 **OR** acetaminophen (TYLENOL) suppository 650 mg, 650 mg, Rectal, Q4H PRN, Judith Part, MD .  canagliflozin [facility]) tablet 100 mg, 100 mg, Oral, QAC breakfast, Judith Part, MD, 100 mg at 07/04/19 0804 .  chlorthalidone (HYGROTON) tablet 25 mg, 25 mg, Oral, Q1400, Judith Part, MD, 25 mg at 07/03/19 1423 .  docusate sodium (COLACE) capsule 100 mg, 100 mg, Oral, BID, Judith Part, MD, 100 mg at 07/04/19 0804 .   heparin injection 5,000 Units, 5,000 Units, Subcutaneous, Q8H, Ostergard, Joyice Faster, MD, 5,000 Units at 07/04/19 0549 .  hydrALAZINE (APRESOLINE) injection 10 mg, 10 mg, Intravenous, Q2H PRN, Judith Part, MD .  HYDROmorphone (DILAUDID) injection 0.5 mg, 0.5 mg, Intravenous, Q3H PRN, Judith Part, MD, 0.5 mg at 07/03/19 0520 .  insulin aspart (novoLOG) injection 0-20 Units, 0-20 Units, Subcutaneous, TID WC, Judith Part, MD, 3 Units at 07/04/19 0805 .  insulin aspart (novoLOG) injection 0-5 Units, 0-5 Units, Subcutaneous, QHS, Judith Part, MD, 3 Units at 07/02/19 2131 .  ondansetron (ZOFRAN) tablet 4 mg, 4 mg, Oral, Q4H PRN, 4 mg at 07/04/19 0313 **OR** ondansetron (ZOFRAN) injection 4 mg, 4 mg, Intravenous, Q4H PRN, Judith Part, MD, 4 mg at 07/03/19 2035 .  oxyCODONE (Oxy IR/ROXICODONE) immediate release tablet 10 mg, 10 mg, Oral, Q4H PRN, Judith Part, MD, 10 mg at 07/04/19 0313 .  oxyCODONE (Oxy IR/ROXICODONE) immediate release tablet 5 mg, 5 mg, Oral, Q4H PRN, Ostergard, Thomas A, MD .  pneumococcal 23 valent vaccine (PNU-IMMUNE) injection 0.5 mL, 0.5 mL, Intramuscular, Tomorrow-1000, Ostergard, Thomas A, MD .  polyethylene glycol (MIRALAX / GLYCOLAX) packet 17 g, 17 g, Oral, Daily PRN, Judith Part, MD .  promethazine (PHENERGAN) tablet 12.5-25 mg, 12.5-25 mg, Oral, Q4H PRN, Ostergard, Thomas A, MD .  rosuvastatin (CRESTOR) tablet 20 mg, 20 mg, Oral, QPM, Ostergard, Joyice Faster, MD, 20 mg at 07/03/19 2034   Physical Exam: AOx3, PERRL, gaze neutral, EOMI, FS with L V1/V2 distribution mild numbness, TM Strength 5/5x4, SILTx4, no drift Incision c/d/i  Assessment & Plan: 48 y.o. woman s/p rsxn of suprameatal meningioma, recovering well. MRI shows excellent resection, images fused from preop, shows ~96.5% resection, ~74mm residual in the lateral IAC that we can watch radiographically, no diffusion changes, sinus patent  -PT/OT, mobilize -d/c  gabapentin, likely adding to sedation and facial dysesthesias have stopped -cont RISS, BG with improved control compared to home -SCDs/TEDs, start [place]  07/04/19 9:00 AM

## 2019-07-04 NOTE — Progress Notes (Signed)
Inpatient Diabetes Program Recommendations  AACE/ADA: New Consensus Statement on Inpatient Glycemic Control (2015)  Target Ranges:  Prepandial:   less than 140 mg/dL      Peak postprandial:   less than 180 mg/dL (1-2 hours)      Critically ill patients:  140 - 180 mg/dL   Lab Results  Component Value Date   GLUCAP 130 (H) 07/04/2019   HGBA1C 10.8 (H) 06/21/2019    Review of Glycemic Control  Spoke with pt about her diabetes control and HgbA1C of 10.8%. Pt states she has started seeing an endocrinologist at [facility] and will need to make appt for f/u when discharged from hospital. Pt states weight stays around average of 250 pounds. Very little exercise, but walks at [employer] when working. Pt was on Farxiga 10 mg QHS and metformin 500 mg QD. Feels like it was not keeping blood sugars controlled.  Current: Invokana 100 mg QAM, Novolog 0-20 units tidwc and 0-5 units QHS. Pt states she would like to try Invokana at home, instead of [place]. Blood sugar 130 mg/dL this am. Pt uses a Libre for glucose monitoring.  Inpatient Diabetes Program Recommendations:     May benefit from Novolog correction insulin tidwc and hs at home, along with Invokana and metformin.  Stressed importance of lifestyle modifications with diet, exercise, stress management. Discussed importance of weight loss in diabetes control.  If pt is to go home on insulin, will order insulin starter kit and teach insulin pen administration.  Continue to follow.   Thank you. Lorenda Peck, RD, LDN, CDE Inpatient Diabetes Coordinator [phone]

## 2019-07-04 NOTE — Progress Notes (Signed)
Patients daughter is a Marine scientist.  She wanted to see if they could change pain meds to codeine and tylenol together because the oxy makes her sleepy and nothing else is working. Dr. Zada Finders didn't think that would be good for absorption. Patient also said she thinks insulin would be better than the pills at home. Dr. Zada Finders said she will go home with same pills she took before surgery.

## 2019-07-04 NOTE — Progress Notes (Signed)
Occupational Therapy Treatment Patient Details Name: Carolyn Holland MRN: [id number] DOB: 1971/01/20 Today's Date: 07/04/2019    History of present illness This 48 y.o. female admitted for resection of suprameatal meningioma.  PMH includes: panic attack, HTN, HA, DM,    OT comments  Pt had reported visual disturbances earlier which have resolved. She feels this was due to medication. Educated pt/daughter on compensatory strategies for ADL to reduce risk of falls and compensate for head pain when bending over. Daughter will be able to assist her mother at DC. Pt complaining of headache and wanting to try a different medication - nsg notified.   Follow Up Recommendations  No OT follow up;Supervision - Intermittent    Equipment Recommendations  None recommended by OT    Recommendations for Other Services      Precautions / Restrictions Precautions Precautions: Fall       Mobility Bed Mobility Overal bed mobility: Modified Independent                Transfers Overall transfer level: Needs assistance               General transfer comment: using RW to ambuulate to bathroom with daughter's assistance.     Balance                                           ADL either performed or assessed with clinical judgement   ADL                                         General ADL Comments: Pt overall set up for ADL tasks. Daughter present for session and educated on compensatory techqnieus for ADL due to head pain when bending over. Recommend pt purchase a reacher adn long handled sponge to assist with ADL and IADL tasks. Pt/daughter verbalized understanidng. Daugher will be able to assist as needed.      Vision   Additional Comments: Pt complaining of visual disturbances earleir. States her vision is blurred and she cna't focus. Gaze is conjugate with no complaints of diplopia. Pt reports vision is much improved since this am.   Perception      Praxis      Cognition Arousal/Alertness: Awake/alert Behavior During Therapy: WFL for tasks assessed/performed Overall Cognitive Status: Within Functional Limits for tasks assessed                                          Exercises     Shoulder Instructions       General Comments VSS    Pertinent Vitals/ Pain       Pain Assessment: 0-10 Pain Score: 6  Pain Location: headache  Pain Descriptors / Indicators: Grimacing;Headache Pain Intervention(s): Limited activity within patient's tolerance;Other (comment)(due for pain meds at 1700)  Home Living                                          Prior Functioning/Environment              Frequency  Progress Toward Goals  OT Goals(current goals can now be found in the care plan section)  Progress towards OT goals: Goals met/education completed, patient discharged from OT  Acute Rehab OT Goals Patient Stated Goal: feel better OT Goal Formulation: With patient/family Potential to Achieve Goals: Good ADL Goals Pt Will Perform Grooming: with modified independence;standing Pt Will Perform Upper Body Bathing: with modified independence;sitting;standing Pt Will Perform Lower Body Bathing: with modified independence;sit to/from stand Pt Will Perform Upper Body Dressing: with modified independence;sitting Pt Will Perform Lower Body Dressing: with modified independence;sit to/from stand Pt Will Transfer to Toilet: with modified independence;ambulating;regular height toilet Pt Will Perform Toileting - Clothing Manipulation and hygiene: with modified independence;sit to/from stand Pt Will Perform Tub/Shower Transfer: Shower transfer;with modified independence Additional ADL Goal #1: Pt will be able to alternate and divide attention independently to complete moderately complex path finding activity  Plan All goals met and education completed, patient discharged from OT services     Co-evaluation                 AM-PAC OT "6 Clicks" Daily Activity     Outcome Measure   Help from another person eating meals?: None Help from another person taking care of personal grooming?: A Little Help from another person toileting, which includes using toliet, bedpan, or urinal?: A Little Help from another person bathing (including washing, rinsing, drying)?: A Little Help from another person to put on and taking off regular upper body clothing?: A Little Help from another person to put on and taking off regular lower body clothing?: A Little 6 Click Score: 19    End of Session    OT Visit Diagnosis: Unsteadiness on feet (R26.81);Pain Pain - part of body: (head)   Activity Tolerance Patient tolerated treatment well   Patient Left in bed;with call bell/phone within reach;with family/visitor present   Nurse Communication Mobility status        Time: 1030-1314 OT Time Calculation (min): 13 min  Charges: OT General Charges $OT Visit: 1 Visit OT Treatments $Self Care/Home Management : 8-22 mins  Maurie Boettcher, OT/L   Acute OT Clinical Specialist [place] Pager [phone] Office [phone]    [facility] 07/04/2019, 4:08 PM

## 2019-07-04 NOTE — Progress Notes (Signed)
Physical Therapy Treatment Patient Details Name: Carolyn Holland MRN: [id number] DOB: December 10, 1970 Today's Date: 07/04/2019    History of Present Illness This 48 y.o. female admitted for resection of suprameatal meningioma.  PMH includes: panic attack, HTN, HA, DM,     PT Comments    Pt continues of have a headache and now reports altered vision and dizziness with movement. Pt requiring RW for safety for amb and was able to amb 27' with minA. Pt emotional with fear this is her new normal. Dr. Zada Finders present at end of session to answer questions and concerns. Pt to benefit from additional night stay to progress indep with amb and to complete stair negotiation as her bedroom is upstairs. Acute PT to cont to follow.    Follow Up Recommendations  No PT follow up;Supervision/Assistance - 24 hour     Equipment Recommendations  Rolling walker with 5" wheels(may not need if pain subsides)    Recommendations for Other Services       Precautions / Restrictions Precautions Precautions: Fall Restrictions Weight Bearing Restrictions: No    Mobility  Bed Mobility Overal bed mobility: Needs Assistance Bed Mobility: Supine to Sit     Supine to sit: Supervision;HOB elevated     General bed mobility comments: pt brought self to EOB, c/o dizziness but denies room spinning  Transfers Overall transfer level: Needs assistance Equipment used: Rolling walker (2 wheeled) Transfers: Sit to/from Stand Sit to Stand: Min guard Stand pivot transfers: Min guard       General transfer comment: verbal cues to push up from bed, pt asked to use RW, min guard for safety  Ambulation/Gait Ambulation/Gait assistance: Min assist Gait Distance (Feet): 75 Feet Assistive device: Rolling walker (2 wheeled) Gait Pattern/deviations: Step-through pattern;Decreased stride length;Wide base of support Gait velocity: slow Gait velocity interpretation: <1.31 ft/sec, indicative of household ambulator General Gait  Details: pt keeping eyes closed due to dizziness and altered vision. Pt requested use of RW due to dizziness and nausea. Pt with increased UE support but steady. pt stopped frequently and even became emotional due to 'I don't want to be like this for forever"   Stairs             Wheelchair Mobility    Modified Rankin (Stroke Patients Only)       Balance Overall balance assessment: Needs assistance Sitting-balance support: Feet unsupported;No upper extremity supported Sitting balance-Leahy Scale: Good     Standing balance support: During functional activity Standing balance-Leahy Scale: Poor Standing balance comment: relied on RW during amb                            Cognition Arousal/Alertness: Awake/alert Behavior During Therapy: Flat affect Overall Cognitive Status: Within Functional Limits for tasks assessed                                 General Comments: pt is emotional, with tears regarding blurred vision and L facial numbness stating "I don't want to be like this for forever."       Exercises      General Comments General comments (skin integrity, edema, etc.): VSS      Pertinent Vitals/Pain Pain Assessment: Faces Faces Pain Scale: Hurts even more Pain Location: headache  Pain Descriptors / Indicators: Grimacing;Headache Pain Intervention(s): Premedicated before session;Monitored during session    Home Living  Prior Function            PT Goals (current goals can now be found in the care plan section) Acute Rehab PT Goals Patient Stated Goal: feel better Progress towards PT goals: Progressing toward goals    Frequency    Min 4X/week      PT Plan Current plan remains appropriate    Co-evaluation              AM-PAC PT "6 Clicks" Mobility   Outcome Measure  Help needed turning from your back to your side while in a flat bed without using bedrails?: A Little Help needed  moving from lying on your back to sitting on the side of a flat bed without using bedrails?: A Little Help needed moving to and from a bed to a chair (including a wheelchair)?: A Little Help needed standing up from a chair using your arms (e.g., wheelchair or bedside chair)?: A Little Help needed to walk in hospital room?: A Little Help needed climbing 3-5 steps with a railing? : A Little 6 Click Score: 18    End of Session Equipment Utilized During Treatment: Gait belt Activity Tolerance: Patient limited by pain Patient left: with call bell/phone within reach;with family/visitor present;in chair Nurse Communication: Mobility status PT Visit Diagnosis: Unsteadiness on feet (R26.81);Muscle weakness (generalized) (M62.81);Difficulty in walking, not elsewhere classified (R26.2);Pain Pain - part of body: (headache)     Time: 4854-6270 PT Time Calculation (min) (ACUTE ONLY): 24 min  Charges:  $Gait Training: 8-22 mins $Therapeutic Activity: 8-22 mins                     Kittie Plater, PT, DPT Acute Rehabilitation Services Pager #: [phone] Office #: [phone]    [place] Lopes 07/04/2019, 8:41 AM

## 2019-07-05 ENCOUNTER — Other Ambulatory Visit: Payer: Self-pay | Admitting: Radiation Therapy

## 2019-07-05 LAB — GLUCOSE, CAPILLARY: Glucose-Capillary: 161 mg/dL — ABNORMAL HIGH (ref 70–99)

## 2019-07-05 MED ORDER — ONDANSETRON HCL 4 MG PO TABS: 4.0000 mg | ORAL_TABLET | Freq: Three times a day (TID) | ORAL | 0 refills | Status: DC | PRN

## 2019-07-05 MED ORDER — CANAGLIFLOZIN 100 MG PO TABS: 100.0000 mg | ORAL_TABLET | Freq: Every day | ORAL | 2 refills | Status: DC

## 2019-07-05 MED ORDER — OXYCODONE HCL 10 MG PO TABS: 10.0000 mg | ORAL_TABLET | ORAL | 0 refills | Status: DC | PRN

## 2019-07-05 MED ORDER — OXYCODONE HCL 5 MG PO TABS: 5.0000 mg | ORAL_TABLET | ORAL | 0 refills | Status: DC | PRN

## 2019-07-05 MED ORDER — DOCUSATE SODIUM 100 MG PO CAPS: 100.0000 mg | ORAL_CAPSULE | Freq: Two times a day (BID) | ORAL | 0 refills | Status: DC

## 2019-07-05 NOTE — Progress Notes (Signed)
Neurosurgery Service Progress Note  Subjective: No acute events overnight, feeling better but still having headaches, some diplopia when fatigued, facial sensation improving  Objective: Vitals:   07/04/19 1628 07/04/19 2005 07/05/19 0014 07/05/19 0407  BP: (!) 148/91 120/73 111/70 113/74  Pulse: 78 81 82 84  Resp: 20     Temp: 98.1 F (36.7 C) 98.6 F (37 C) 98.6 F (37 C) 98.5 F (36.9 C)  TempSrc: Oral Oral Oral Oral  SpO2: 100% 97% 97% 93%  Weight:      Height:       Temp (24hrs), Avg:98.5 F (36.9 C), Min:98.1 F (36.7 C), Max:98.8 F (37.1 C)  CBC Latest Ref Rng & Units 07/03/2019 07/02/2019 07/02/2019  WBC 4.0 - 10.5 K/uL 10.5 13.3(H) -  Hemoglobin 12.0 - 15.0 g/dL 14.0 13.8 13.3  Hematocrit 36.0 - 46.0 % 43.2 41.3 39.0  Platelets 150 - 400 K/uL 209 207 -   BMP Latest Ref Rng & Units 07/03/2019 07/02/2019 06/21/2019  Glucose 70 - 99 mg/dL 241(H) - 255(H)  BUN 6 - 20 mg/dL 12 - 12  Creatinine 0.44 - 1.00 mg/dL 0.66 0.68 0.91  Sodium 135 - 145 mmol/L 138 141 137  Potassium 3.5 - 5.1 mmol/L 3.6 3.4(L) 3.7  Chloride 98 - 111 mmol/L 105 - 99  CO2 22 - 32 mmol/L 22 - 28  Calcium 8.9 - 10.3 mg/dL 8.4(L) - 9.9   No intake or output data in the 24 hours ending 07/05/19 0830  Current Facility-Administered Medications:  .  acetaminophen (TYLENOL) tablet 650 mg, 650 mg, Oral, Q4H PRN, 650 mg at 07/05/19 0657 **OR** acetaminophen (TYLENOL) suppository 650 mg, 650 mg, Rectal, Q4H PRN, Judith Part, MD .  bacitracin ointment 1 application, 1 application, Topical, BID, Judith Part, MD, 1 application at 41/63/84 2220 .  canagliflozin [facility]) tablet 100 mg, 100 mg, Oral, QAC breakfast, Judith Part, MD, 100 mg at 07/04/19 0804 .  chlorthalidone (HYGROTON) tablet 25 mg, 25 mg, Oral, Q1400, Judith Part, MD, 25 mg at 07/04/19 1303 .  docusate sodium (COLACE) capsule 100 mg, 100 mg, Oral, BID, Hunter Pinkard A, MD, 100 mg at 07/04/19 2220 .  heparin  injection 5,000 Units, 5,000 Units, Subcutaneous, Q8H, Judith Part, MD, 5,000 Units at 07/05/19 [id number] .  hydrALAZINE (APRESOLINE) injection 10 mg, 10 mg, Intravenous, Q2H PRN, Judith Part, MD .  HYDROmorphone (DILAUDID) injection 0.5 mg, 0.5 mg, Intravenous, Q3H PRN, Judith Part, MD, 0.5 mg at 07/03/19 0520 .  insulin aspart (novoLOG) injection 0-20 Units, 0-20 Units, Subcutaneous, TID WC, Malva Diesing, Joyice Faster, MD, 3 Units at 07/04/19 1750 .  insulin aspart (novoLOG) injection 0-5 Units, 0-5 Units, Subcutaneous, QHS, Judith Part, MD, 3 Units at 07/02/19 2131 .  ondansetron (ZOFRAN) tablet 4 mg, 4 mg, Oral, Q4H PRN, 4 mg at 07/04/19 0313 **OR** ondansetron (ZOFRAN) injection 4 mg, 4 mg, Intravenous, Q4H PRN, Judith Part, MD, 4 mg at 07/03/19 2035 .  oxyCODONE (Oxy IR/ROXICODONE) immediate release tablet 10 mg, 10 mg, Oral, Q4H PRN, Judith Part, MD, 10 mg at 07/05/19 0657 .  oxyCODONE (Oxy IR/ROXICODONE) immediate release tablet 5 mg, 5 mg, Oral, Q4H PRN, Judith Part, MD, 5 mg at 07/04/19 1303 .  pneumococcal 23 valent vaccine (PNU-IMMUNE) injection 0.5 mL, 0.5 mL, Intramuscular, Tomorrow-1000, Kohl Polinsky A, MD .  polyethylene glycol (MIRALAX / GLYCOLAX) packet 17 g, 17 g, Oral, Daily PRN, Judith Part, MD .  promethazine (PHENERGAN)  tablet 12.5-25 mg, 12.5-25 mg, Oral, Q4H PRN, Judith Part, MD .  rosuvastatin (CRESTOR) tablet 20 mg, 20 mg, Oral, QPM, Denson Niccoli, Joyice Faster, MD, 20 mg at 07/04/19 2219   Physical Exam: AOx3, PERRL, gaze neutral, EOMI, FS with L V1/V2 distribution mild numbness, TM Strength 5/5x4, SILTx4, no drift Incision c/d/i  Assessment & Plan: 48 y.o. woman s/p rsxn of suprameatal meningioma, recovering well. MRI shows excellent resection, images fused from preop, shows ~96.5% resection, ~52mm residual in the lateral IAC that we can watch radiographically, no diffusion changes, sinus patent  -discharge  home today -BG better controlled on canagliflozin than home dapagliflozin, will discharge on canagliflozin  -SCDs/TEDs, SQH  Marcello Moores A Jannelle Notaro  07/05/19 8:30 AM

## 2019-07-05 NOTE — Discharge Instructions (Addendum)
Discharge Instructions  No restriction in activities, slowly increase your activity back to normal.   Your incision is closed with absorbable sutures. These will naturally fall off over the next 4-6 weeks. If they become bothersome or cause discomfort, apply some antibiotic ointment like bacitracin or neosporin on the sutures. This will soften them up and usually makes them more comfortable while they dissolve.  Okay to shower on the day of discharge. Be gentle when cleaning your incision. Use regular soap and water. If that is uncomfortable, try using baby shampoo. Do not submerge the wound under water for 2 weeks after surgery.  Follow up with Dr. Samanatha Brammer in 2 weeks after discharge. If you do not already have a discharge appointment, please call his office at [phone] to schedule a follow up appointment. If you have any concerns or questions, please call the office and let us know. 

## 2019-07-05 NOTE — Discharge Summary (Signed)
Discharge Summary  Date of Admission: 07/02/2019  Date of Discharge: 07/05/19  Attending Physician: Emelda Brothers, MD  Hospital Course: Patient was admitted following an uncomplicated left retrosigmoid craniotomy for resection of a suprameatal meningioma. She was recovered in PACU and transferred to 4N. A post-op MRI showed a good resection of her tumor with minimal residual. Post-op, she had no new deficits and improvement in her facial symptoms. Her hospital course was uncomplicated and the patient was discharged home on 07/05/2019. Canagliflozin was substitued for her home farxiga with good BG control so she was discharged on this instead. She will follow up in clinic with me in 2 weeks.  Neurologic exam at discharge:  AOx3, PERRL, EOMI, FS with mild L V1/V2 numbness, TM Strength 5/5 x4, SILTx4, no drift  Discharge diagnosis: Skull base meningioma  Judith Part, MD 07/05/19 8:33 AM

## 2019-07-11 ENCOUNTER — Other Ambulatory Visit (HOSPITAL_COMMUNITY): Payer: Self-pay | Admitting: Neurological Surgery

## 2019-07-11 ENCOUNTER — Ambulatory Visit (HOSPITAL_COMMUNITY): Admission: RE | Admit: 2019-07-11 | Payer: 59 | Source: Ambulatory Visit

## 2019-07-11 ENCOUNTER — Encounter (HOSPITAL_COMMUNITY): Payer: Self-pay

## 2019-07-11 ENCOUNTER — Other Ambulatory Visit: Payer: Self-pay | Admitting: Neurological Surgery

## 2019-07-11 DIAGNOSIS — D320 Benign neoplasm of cerebral meninges: Secondary | ICD-10-CM

## 2019-07-12 ENCOUNTER — Encounter (HOSPITAL_COMMUNITY): Payer: Self-pay | Admitting: Neurological Surgery

## 2019-07-13 ENCOUNTER — Encounter (HOSPITAL_COMMUNITY): Payer: Self-pay | Admitting: Neurological Surgery

## 2019-07-27 ENCOUNTER — Emergency Department (HOSPITAL_COMMUNITY)
Admission: EM | Admit: 2019-07-27 | Discharge: 2019-07-27 | Disposition: A | Discharge status: Home / Self Care | Payer: 59 | Attending: Emergency Medicine | Admitting: Emergency Medicine

## 2019-07-27 ENCOUNTER — Other Ambulatory Visit: Payer: Self-pay

## 2019-07-27 ENCOUNTER — Encounter (HOSPITAL_COMMUNITY): Payer: Self-pay | Admitting: *Deleted

## 2019-07-27 DIAGNOSIS — Z7984 Long term (current) use of oral hypoglycemic drugs: Secondary | ICD-10-CM | POA: Insufficient documentation

## 2019-07-27 DIAGNOSIS — E119 Type 2 diabetes mellitus without complications: Secondary | ICD-10-CM | POA: Diagnosis not present

## 2019-07-27 DIAGNOSIS — I10 Essential (primary) hypertension: Secondary | ICD-10-CM | POA: Insufficient documentation

## 2019-07-27 DIAGNOSIS — Z79899 Other long term (current) drug therapy: Secondary | ICD-10-CM | POA: Insufficient documentation

## 2019-07-27 DIAGNOSIS — N390 Urinary tract infection, site not specified: Secondary | ICD-10-CM | POA: Insufficient documentation

## 2019-07-27 DIAGNOSIS — Z8673 Personal history of transient ischemic attack (TIA), and cerebral infarction without residual deficits: Secondary | ICD-10-CM | POA: Insufficient documentation

## 2019-07-27 DIAGNOSIS — M545 Low back pain: Secondary | ICD-10-CM | POA: Diagnosis present

## 2019-07-27 LAB — URINALYSIS, ROUTINE W REFLEX MICROSCOPIC
Bilirubin Urine: NEGATIVE
Glucose, UA: >=500 mg/dL — AB
Hgb urine dipstick: NEGATIVE
Ketones, ur: 5 mg/dL — AB
Nitrite: NEGATIVE
Protein, ur: NEGATIVE mg/dL
Specific Gravity, Urine: 1.029 (ref 1.005–1.030)
WBC, UA: >50 WBC/hpf — ABNORMAL HIGH (ref 0–5)
pH: 6 (ref 5.0–8.0)

## 2019-07-27 MED ORDER — NITROFURANTOIN MONOHYD MACRO 100 MG PO CAPS: 100.0000 mg | ORAL_CAPSULE | Freq: Two times a day (BID) | ORAL | 0 refills | Status: DC

## 2019-07-27 MED ORDER — PHENAZOPYRIDINE HCL 200 MG PO TABS: 200.0000 mg | ORAL_TABLET | Freq: Three times a day (TID) | ORAL | 0 refills | Status: DC

## 2019-07-27 NOTE — ED Provider Notes (Signed)
[facility] EMERGENCY DEPARTMENT Provider Note   CSN: [id number] Arrival date & time: 07/27/19  [id number]     History   Chief Complaint Chief Complaint  Patient presents with  . Dysuria  . Back Pain    HPI Carolyn Holland is a 48 y.o. female.     48yo female presents with dysuria since 07/05/2019. Patient was admitted to the hospital 07/02/2019 for resection of menengioma, dc on 07/05/2019. States she feels like she has a UTI, also has low back pain, radiates around left and right sides, as well as struggling with constipation since her surgery- taking Colace daily. Denies fevers, abdominal pain, vomiting, hematuria, urgency/frequency.      Past Medical History:  Diagnosis Date  . Anemia 12/22/2012  . Anxiety 2012  . Arthritis    RA  . Bilateral anterior knee pain 2012  . Diabetes mellitus without complication ([place])   . GERD (gastroesophageal reflux disease) 2000   come and go  . Headache    As a child  . Hyperlipidemia   . Hypertension 2008  . Panic attack 2012   situational related to move from [place].     Patient Active Problem List   Diagnosis Date Noted  . Status post craniotomy 07/02/2019  . Brain tumor ([place]) 07/02/2019  . Palpitations 12/20/2017  . Essential hypertension 12/20/2017  . Hypercholesterolemia 12/20/2017  . Varicose veins of both lower extremities with pain 12/20/2017  . Medication management 02/11/2017  . Elevated LDL cholesterol level 02/11/2017  . Facial twitching   . Chest pain   . TIA (transient ischemic attack) 11/07/2014  . Hot flashes 12/14/2013  . Venous insufficiency 12/11/2013  . Acid reflux 10/04/2013  . Morbid obesity due to excess calories ([place]) 08/23/2013  . Atypical chest pain 06/15/2013  . Obstructive sleep apnea 03/02/2013  . Right hip pain 01/29/2013  . Diabetes mellitus, type 2 ([place]) 12/29/2012  . Vitamin D deficiency 12/22/2012  . Anxiety   . Benign hypertension   . Bilateral anterior knee pain      Past Surgical History:  Procedure Laterality Date  . ABDOMINAL HYSTERECTOMY    . APPLICATION OF CRANIAL NAVIGATION N/A 07/02/2019   Procedure: APPLICATION OF CRANIAL NAVIGATION;  Surgeon: Judith Part, MD;  Location: [facility];  Service: Neurosurgery;  Laterality: N/A;  . CESAREAN SECTION  2002  . laproscopic knee surgery  2011   L knee for bilateral meniscal tear. Following acute injury.   [name] PARTIAL HYSTERECTOMY  2009  . RETROSIGMOID CRANIECTOMY FOR TUMOR RESECTION Left 07/02/2019   Procedure: Left Retrosigmoid craniotomy for tumor resection;  Surgeon: Judith Part, MD;  Location: [facility];  Service: Neurosurgery;  Laterality: Left;     OB History    Gravida  4   Para  3   Term  3   Preterm      AB  1   Living  3     SAB      TAB  1   Ectopic      Multiple      Live Births               Home Medications    Prior to Admission medications   Medication Sig Start Date End Date Taking? Authorizing Provider  canagliflozin (INVOKANA) 100 MG TABS tablet Take 1 tablet (100 mg total) by mouth daily before breakfast. 07/05/19   Judith Part, MD  chlorthalidone (HYGROTON) 25 MG tablet Take 25 mg by mouth daily  at 2 PM.     [provider]  diclofenac sodium (VOLTAREN) 1 % GEL Apply 1-2 g topically 4 (four) times daily as needed for pain. 05/12/19   [provider]  docusate sodium (COLACE) 100 MG capsule Take 1 capsule (100 mg total) by mouth 2 (two) times daily. 07/05/19   Judith Part, MD  metFORMIN (GLUCOPHAGE-XR) 500 MG 24 hr tablet Take 500 mg by mouth daily. 02/21/19   [provider]  nitrofurantoin, macrocrystal-monohydrate, (MACROBID) 100 MG capsule Take 1 capsule (100 mg total) by mouth 2 (two) times daily. 07/27/19   Tacy Learn, PA-C  omeprazole (PRILOSEC) 40 MG capsule Take 40 mg by mouth daily as needed (acid reflux/indigestion.).    [provider]  ondansetron (ZOFRAN) 4 MG tablet Take 1 tablet (4 mg  total) by mouth every 8 (eight) hours as needed for nausea or vomiting. 07/05/19   Judith Part, MD  oxyCODONE 10 MG TABS Take 1 tablet (10 mg total) by mouth every 4 (four) hours as needed (pain). 07/05/19   Judith Part, MD  phenazopyridine (PYRIDIUM) 200 MG tablet Take 1 tablet (200 mg total) by mouth 3 (three) times daily. 07/27/19   Tacy Learn, PA-C  potassium chloride (K-DUR) 10 MEQ tablet Take 1 tablet (10 mEq total) by mouth daily. 01/29/19 06/19/20  Almyra Deforest, PA  rosuvastatin (CRESTOR) 20 MG tablet Take 20 mg by mouth every evening.     [provider]    Family History Family History  Problem Relation Age of Onset  . Diabetes Mother   . Hypertension Mother   . Heart disease Mother 28  . Stroke Mother 40       x 2  . Alcohol abuse Mother        previous   . Drug abuse Mother        previous cocaine   . Hypertension Father     Social History Social History   Tobacco Use  . Smoking status: Never Smoker  . Smokeless tobacco: Never Used  Substance Use Topics  . Alcohol use: No    Alcohol/week: 0.0 standard drinks    Comment: occassional, social, wine  . Drug use: No     Allergies   Penicillins   Review of Systems Review of Systems  Constitutional: Negative for fever.  Gastrointestinal: Positive for constipation. Negative for abdominal pain and vomiting.  Genitourinary: Positive for dysuria. Negative for frequency and urgency.  Musculoskeletal: Positive for back pain.  Skin: Negative for wound.  Allergic/Immunologic: Positive for immunocompromised state.  All other systems reviewed and are negative.    Physical Exam Updated Vital Signs BP (!) 130/91 (BP Location: Right Arm)   Pulse (!) 109   Temp 98.3 F (36.8 C) (Oral)   Resp 16   Ht 5\' 6"  (1.676 m)   Wt 106.6 kg   SpO2 100%   BMI 37.93 kg/m   Physical Exam Vitals signs and nursing note reviewed.  Constitutional:      General: She is not in acute distress.    Appearance:  She is well-developed. She is obese. She is not diaphoretic.  HENT:     Head: Normocephalic and atraumatic.  Cardiovascular:     Rate and Rhythm: Regular rhythm. Tachycardia present.     Pulses: Normal pulses.     Heart sounds: Normal heart sounds.  Pulmonary:     Effort: Pulmonary effort is normal.     Breath sounds: Normal breath sounds.  Abdominal:     Tenderness: There is abdominal tenderness in the left lower quadrant. There is no right CVA tenderness or left CVA tenderness.  Musculoskeletal:        General: No tenderness.  Skin:    General: Skin is warm and dry.  Neurological:     Mental Status: She is alert and oriented to person, place, and time.  Psychiatric:        Behavior: Behavior normal.      ED Treatments / Results  Labs (all labs ordered are listed, but only abnormal results are displayed) Labs Reviewed  URINALYSIS, ROUTINE W REFLEX MICROSCOPIC - Abnormal; Notable for the following components:      Result Value   APPearance HAZY (*)    Glucose, UA >=500 (*)    Ketones, ur 5 (*)    Leukocytes,Ua SMALL (*)    WBC, UA >50 (*)    Bacteria, UA RARE (*)    All other components within normal limits    EKG None  Radiology No results found.  Procedures Procedures (including critical care time)  Medications Ordered in ED Medications - No data to display   Initial Impression / Assessment and Plan / ED Course  I have reviewed the triage vital signs and the nursing notes.  Pertinent labs & imaging results that were available during my care of the patient were reviewed by me and considered in my medical decision making (see chart for details).  Clinical Course as of Jul 26 1141  Fri Jul 26, 6540  [id number] 48 year old female presents with complaint of dysuria after she was discharged in the hospital on August 13.  Also reports struggling with constipation, taking pain medications as prescribed as well as a Colace daily.  On exam patient does not have any CVA  tenderness, no musculoskeletal tenderness she has mild tenderness left lower quadrant.  Patient is anxious appearing. Urinalysis is positive for leukocytes and bacteria and greater than 50 white cells.  Glucosuria due to Invokana. Patient be discharged on Macrobid, given Pyridium to help with her discomfort.  Also recommend that she take an capful of MiraLAX twice daily and use glycerin suppositories as needed for her constipation.  Recommend she recheck with her PCP, return to ER for new or worsening symptoms.   [LM]    Clinical Course User Index [LM] Tacy Learn, PA-C      Final Clinical Impressions(s) / ED Diagnoses   Final diagnoses:  Urinary tract infection in female    ED Discharge Orders         Ordered    nitrofurantoin, macrocrystal-monohydrate, (MACROBID) 100 MG capsule  2 times daily     07/27/19 1135    phenazopyridine (PYRIDIUM) 200 MG tablet  3 times daily     07/27/19 1135           Tacy Learn, PA-C 07/27/19 1142    Carmin Muskrat, MD 07/27/19 1434

## 2019-07-27 NOTE — Discharge Instructions (Addendum)
Take Macrobid as prescribed and complete the full course. Take Pyridium as needed for painful urination. Use Glycerine suppositories to help with constipation. Add in 1 capful of Miralax twice daily if not improving with the suppositories.

## 2019-07-27 NOTE — ED Triage Notes (Signed)
Pt states bil lower back pain and pain with urination.  States symptoms started when she was discharged from the hospital on 8/13 after having brain surgery.

## 2019-08-22 DIAGNOSIS — Z8673 Personal history of transient ischemic attack (TIA), and cerebral infarction without residual deficits: Secondary | ICD-10-CM | POA: Insufficient documentation

## 2019-08-22 DIAGNOSIS — R7689 Other specified abnormal immunological findings in serum: Secondary | ICD-10-CM | POA: Insufficient documentation

## 2019-08-22 DIAGNOSIS — R768 Other specified abnormal immunological findings in serum: Secondary | ICD-10-CM | POA: Insufficient documentation

## 2019-08-22 DIAGNOSIS — R87810 Cervical high risk human papillomavirus (HPV) DNA test positive: Secondary | ICD-10-CM | POA: Insufficient documentation

## 2019-08-22 DIAGNOSIS — R87610 Atypical squamous cells of undetermined significance on cytologic smear of cervix (ASC-US): Secondary | ICD-10-CM | POA: Insufficient documentation

## 2019-09-22 IMAGING — MR MRI HEAD WITHOUT AND WITH CONTRAST
11 of 13 series · 36 of 48 positions shown · IV contrast (gadavist)
Comparison: Head CT from yesterday

CLINICAL DATA: Status post craniotomy for tumor resection

EXAM:
MRI HEAD WITHOUT AND WITH CONTRAST
TECHNIQUE: Multiplanar, multiecho pulse sequences of the brain and surrounding
structures were obtained without and with intravenous contrast.
CONTRAST:  10 cc Gadavist intravenous

[Series 5: DWI · axial · 4.0mm · 0.88mm/px · z∈[-73,+67]mm · 7 of 74 slices shown (1 of 4)]
[im 1/74]
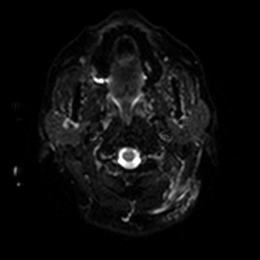
[im 13/74]
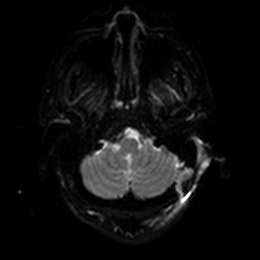
[im 25/74]
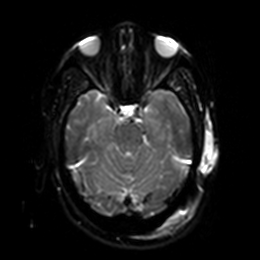
[im 37/74]
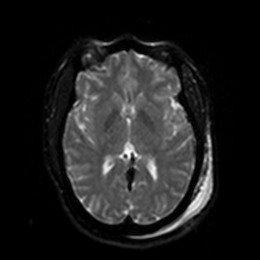
[im 49/74]
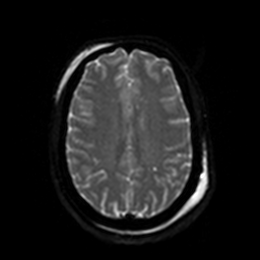
[im 61/74]
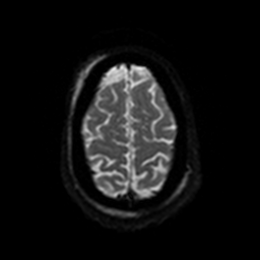
[im 74/74]
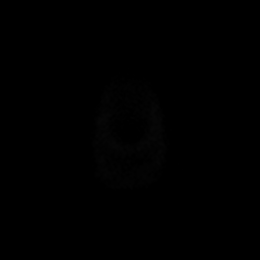

[Series 6: DWI · axial · 4.0mm · 0.88mm/px · z∈[-73,+67]mm · 4 of 37 slices shown (2 of 4)]
[im 1/37]
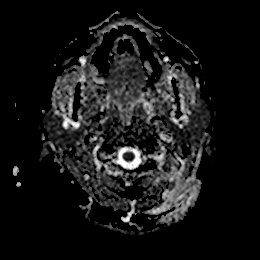
[im 13/37]
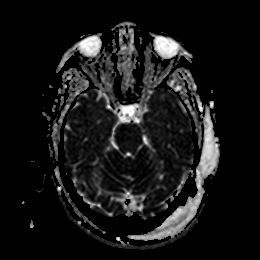
[im 25/37]
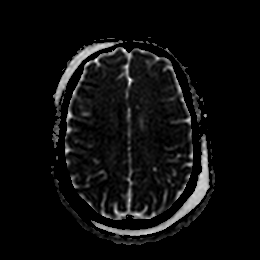
[im 37/37]
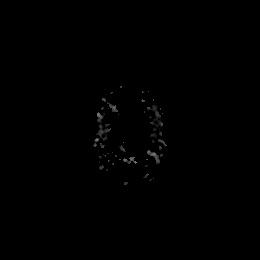

[Series 7: DWI · coronal · 4.0mm · 0.88mm/px · 6 of 64 slices shown (3 of 4)]
[im 1/64]
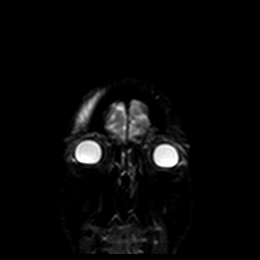
[im 13/64]
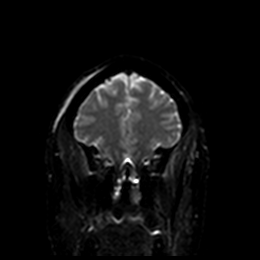
[im 26/64]
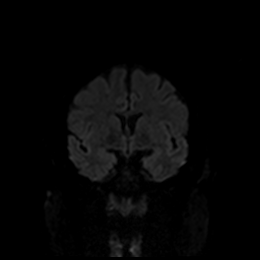
[im 38/64]
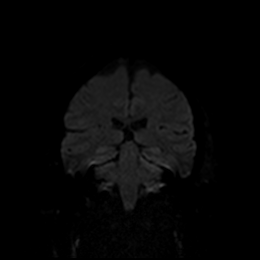
[im 51/64]
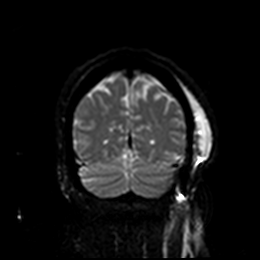
[im 64/64]
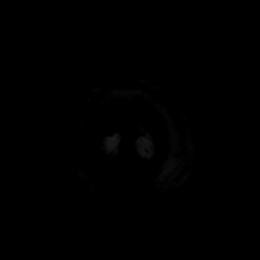

[Series 8: DWI · coronal · 4.0mm · 0.88mm/px · 3 of 32 slices shown (4 of 4)]
[im 1/32]
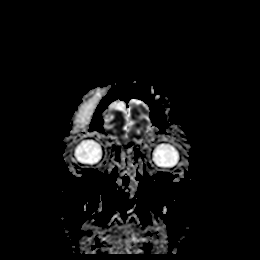
[im 16/32]
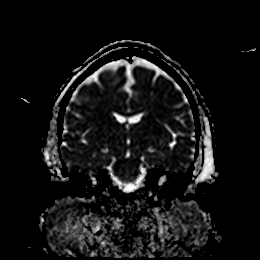
[im 32/32]
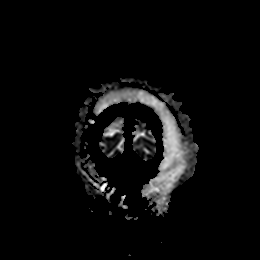

[Series 9: T1 · sagittal · 5.0mm · 0.75mm/px · 2 of 23 slices shown]
[im 1/23]
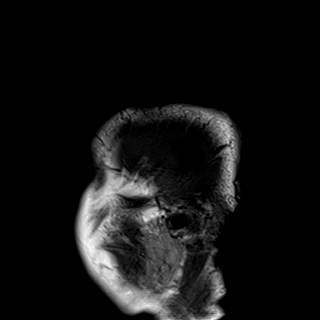
[im 23/23]
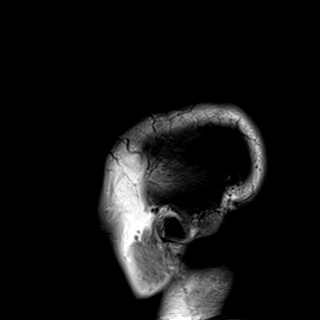

[Series 10: T2 · axial · 5.0mm · 0.72mm/px · z∈[-74,+67]mm · 2 of 25 slices shown]
[im 1/25]
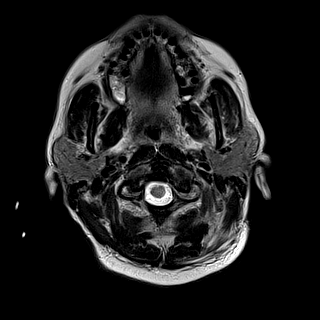
[im 25/25]
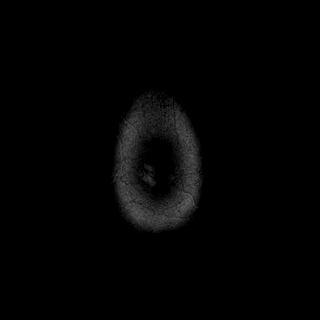

[Series 11: FLAIR · axial · 5.0mm · 0.90mm/px · z∈[-76,+64]mm · 2 of 25 slices shown]
[im 1/25]
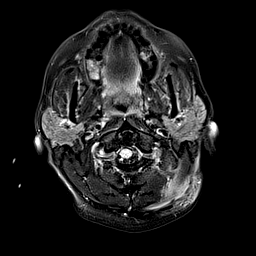
[im 25/25]
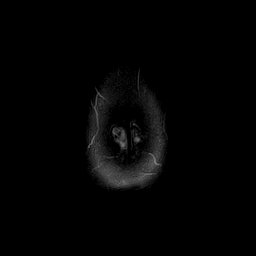

[Series 12: ax hemo · axial · 5.0mm · 0.86mm/px · z∈[-74,+66]mm · 2 of 25 slices shown]
[im 1/25]
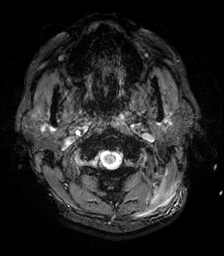
[im 25/25]
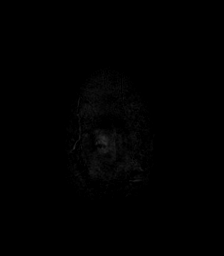

[Series 14: T2 post-contrast · coronal · 5.0mm · 0.72mm/px · 3 of 28 slices shown]
[im 1/28]
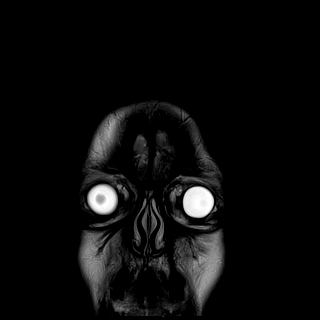
[im 14/28]
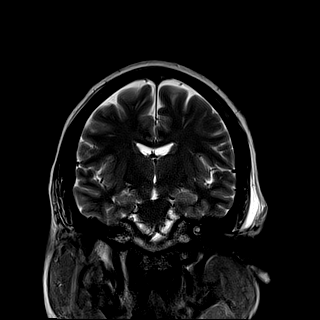
[im 28/28]
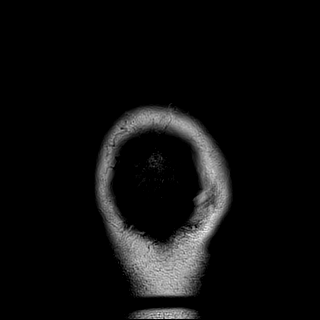

[Series 16: T1 post-contrast · coronal · 5.0mm · 0.34mm/px · 3 of 28 slices shown (1 of 2)]
[im 1/28]
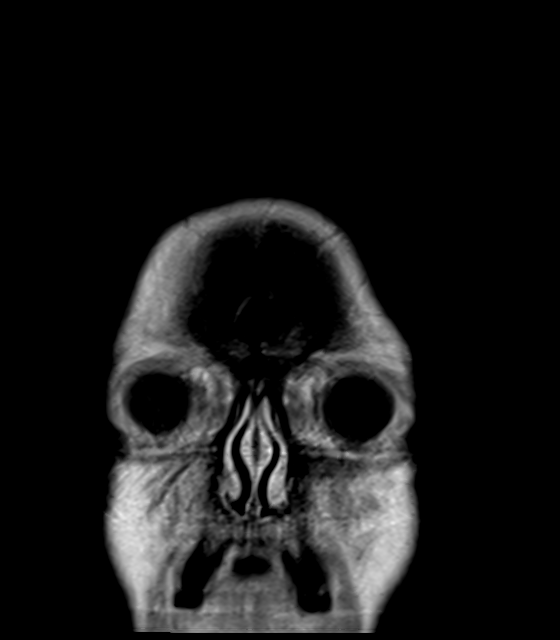
[im 14/28]
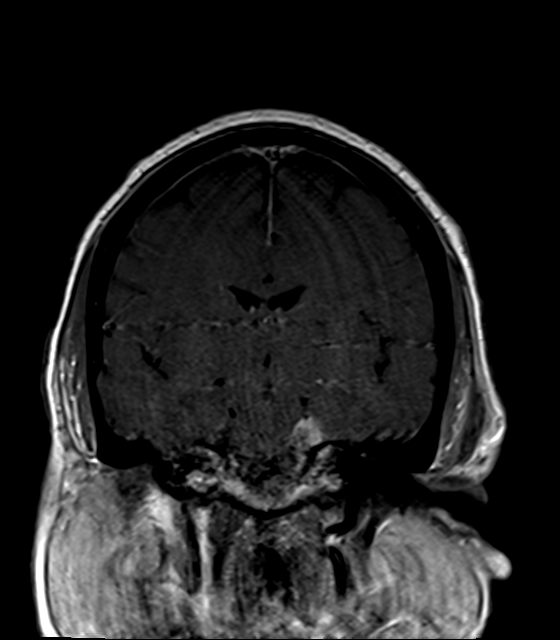
[im 28/28]
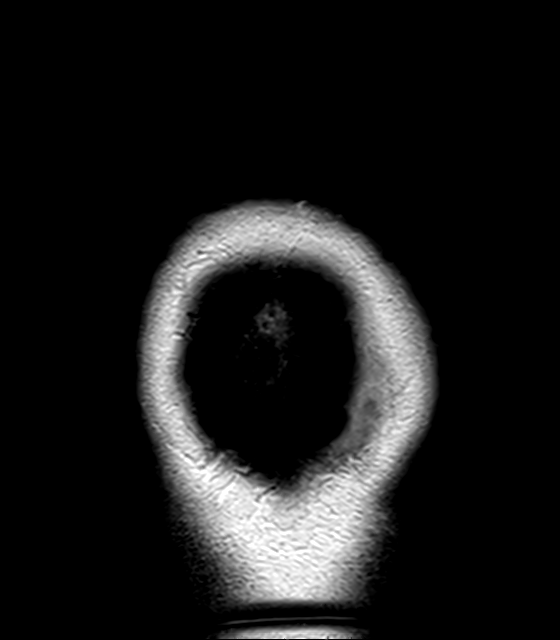

[Series 17: T1 post-contrast · sagittal · 5.0mm · 0.72mm/px · 2 of 23 slices shown (2 of 2)]
[im 1/23]
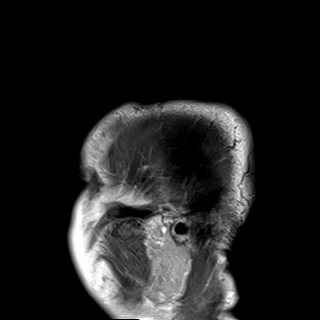
[im 23/23]
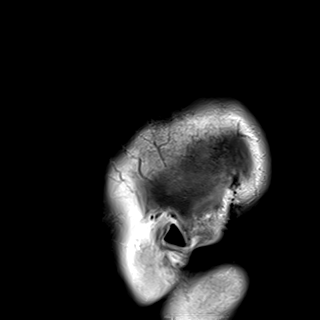

[36 of 48 positions shown; findings below may reference images not displayed]

FINDINGS: Brain: Interval resection of left CP angle mass via retromastoid
craniotomy. There is stable mild indentation of the pons at the site
of resected tumor. Mild dural thickening seen at the anterior left
tentorial leaflet. Small residual nodular component at the level of
the internal auditory canal measuring 4 mm. No complicating infarct
or hemorrhage. No hydrocephalus.

Vascular: Patent left sigmoid and transverse sinus adjacent to the
craniotomy.

Skull and upper cervical spine: Unremarkable craniotomy. No
organized CSF collection.

Sinuses/Orbits: Negative
IMPRESSION: No complicating feature after left CP angle mass (presumed
meningioma) resection. 4 mm nodular component persists at the
internal auditory canal and there is mild thickening of the anterior
left tentorial leaflet.

## 2019-10-08 ENCOUNTER — Other Ambulatory Visit: Payer: Self-pay | Admitting: Neurosurgery

## 2019-10-08 ENCOUNTER — Other Ambulatory Visit (HOSPITAL_COMMUNITY): Payer: Self-pay | Admitting: Neurosurgery

## 2019-10-08 DIAGNOSIS — D320 Benign neoplasm of cerebral meninges: Secondary | ICD-10-CM

## 2019-10-09 ENCOUNTER — Encounter (HOSPITAL_COMMUNITY): Payer: Self-pay

## 2019-10-09 ENCOUNTER — Other Ambulatory Visit: Payer: Self-pay

## 2019-10-09 ENCOUNTER — Ambulatory Visit (HOSPITAL_COMMUNITY)
Admission: RE | Admit: 2019-10-09 | Discharge: 2019-10-09 | Disposition: A | Discharge status: Home / Self Care | Payer: 59 | Source: Ambulatory Visit | Attending: Neurosurgery | Admitting: Neurosurgery

## 2019-10-09 DIAGNOSIS — D320 Benign neoplasm of cerebral meninges: Secondary | ICD-10-CM | POA: Insufficient documentation

## 2019-12-10 ENCOUNTER — Other Ambulatory Visit (HOSPITAL_COMMUNITY): Payer: Self-pay | Admitting: Neurological Surgery

## 2019-12-10 ENCOUNTER — Other Ambulatory Visit: Payer: Self-pay | Admitting: Neurological Surgery

## 2019-12-10 DIAGNOSIS — D320 Benign neoplasm of cerebral meninges: Secondary | ICD-10-CM

## 2020-01-06 ENCOUNTER — Ambulatory Visit (HOSPITAL_COMMUNITY): Payer: 59 | Attending: Neurological Surgery

## 2020-01-06 ENCOUNTER — Encounter (HOSPITAL_COMMUNITY): Payer: Self-pay

## 2020-01-22 ENCOUNTER — Other Ambulatory Visit (HOSPITAL_COMMUNITY): Payer: Self-pay | Admitting: General Surgery

## 2020-01-22 ENCOUNTER — Other Ambulatory Visit: Payer: Self-pay | Admitting: General Surgery

## 2020-01-22 DIAGNOSIS — Z01818 Encounter for other preprocedural examination: Secondary | ICD-10-CM

## 2020-01-28 ENCOUNTER — Other Ambulatory Visit: Payer: Self-pay | Admitting: Neurosurgery

## 2020-01-28 ENCOUNTER — Other Ambulatory Visit (HOSPITAL_COMMUNITY): Payer: Self-pay | Admitting: Neurosurgery

## 2020-01-28 DIAGNOSIS — D329 Benign neoplasm of meninges, unspecified: Secondary | ICD-10-CM

## 2020-01-29 DIAGNOSIS — M791 Myalgia, unspecified site: Secondary | ICD-10-CM | POA: Insufficient documentation

## 2020-01-31 ENCOUNTER — Other Ambulatory Visit (HOSPITAL_COMMUNITY): Payer: Self-pay | Admitting: Neurological Surgery

## 2020-01-31 DIAGNOSIS — D320 Benign neoplasm of cerebral meninges: Secondary | ICD-10-CM

## 2020-02-01 ENCOUNTER — Ambulatory Visit (HOSPITAL_COMMUNITY)
Admission: RE | Admit: 2020-02-01 | Discharge: 2020-02-01 | Disposition: A | Discharge status: Home / Self Care | Payer: 59 | Source: Ambulatory Visit | Attending: General Surgery | Admitting: General Surgery

## 2020-02-01 ENCOUNTER — Other Ambulatory Visit: Payer: Self-pay

## 2020-02-01 DIAGNOSIS — Z01818 Encounter for other preprocedural examination: Secondary | ICD-10-CM | POA: Diagnosis not present

## 2020-02-07 ENCOUNTER — Encounter (HOSPITAL_COMMUNITY): Payer: Self-pay

## 2020-02-07 ENCOUNTER — Ambulatory Visit (HOSPITAL_COMMUNITY): Payer: 59

## 2020-02-11 ENCOUNTER — Ambulatory Visit (HOSPITAL_COMMUNITY)
Admission: RE | Admit: 2020-02-11 | Discharge: 2020-02-11 | Disposition: A | Discharge status: Home / Self Care | Payer: 59 | Source: Ambulatory Visit | Attending: Neurological Surgery | Admitting: Neurological Surgery

## 2020-02-11 ENCOUNTER — Other Ambulatory Visit: Payer: Self-pay

## 2020-02-11 DIAGNOSIS — D320 Benign neoplasm of cerebral meninges: Secondary | ICD-10-CM

## 2020-02-11 MED ORDER — GADOBUTROL 1 MMOL/ML IV SOLN
10.0000 mL | Freq: Once | INTRAVENOUS | Status: AC | PRN
  Administered 2020-02-11: 10 mL via INTRAVENOUS

## 2020-02-12 ENCOUNTER — Other Ambulatory Visit: Payer: Self-pay

## 2020-02-12 ENCOUNTER — Encounter: Payer: 59 | Attending: General Surgery | Admitting: Dietician

## 2020-02-12 ENCOUNTER — Encounter: Payer: Self-pay | Admitting: Dietician

## 2020-02-12 DIAGNOSIS — E669 Obesity, unspecified: Secondary | ICD-10-CM | POA: Diagnosis not present

## 2020-02-12 NOTE — Progress Notes (Signed)
Nutrition Assessment for Bariatric Surgery Medical Nutrition Therapy  Appt Start Time: 8:45am     End Time: 9:45am  Patient was seen on 02/12/2020 for Pre-Operative Nutrition Assessment. Letter of approval faxed to [facility] bariatric surgery program coordinator on 02/12/2020.   Referral stated Supervised Weight Loss (SWL) visits needed: 6  Planned surgery: Sleeve Gastrectomy  Pt expectation of surgery: to lose weight prior to knee replacement surgery, control diabetes and cholesterol, to be healthier  Pt expectation of dietitian: diabetes-friendly meals, dietary guidance before surgery   NUTRITION ASSESSMENT   Anthropometrics  Start weight at [facility]: 245 lbs (date: 02/12/2020) Height: 66 in BMI: 39.5 kg/m2     Lifestyle & Dietary Hx Works 2 jobs, very busy. Very motivated and is already doing well at making better food choices and balancing out foods. Typical meal pattern is 3 meals per day. May have apple cinnamon oatmeal for breakfast. May have soup, tuna salad, cold cut with lettuce in a wrap with grapes, a [employer] kids meal or potato wedges for meals. May snack on Doritos if anything. Avoids certain vegetables. Blood sugars lately average 120-150.   24-Hr Dietary Recall First Meal: banana + yogurt  Snack: - Second Meal: salad Snack: - Third Meal: salmon + rice + broccoli Snack: - Beverages: water   NUTRITION DIAGNOSIS  Overweight/obesity ([place]-3.3) related to past poor dietary habits and physical inactivity as evidenced by patient w/ planned Sleeve Gastrectomy surgery following dietary guidelines for continued weight loss.    NUTRITION INTERVENTION  Nutrition counseling (C-1) and education (E-2) to facilitate bariatric surgery goals.  Pre-Op Goals Reviewed with the Patient . Track food and beverage intake (pen and paper, MyFitness Pal, Baritastic app, etc.) . Make healthy food choices while monitoring portion sizes . Consume 3 meals per day or try to eat  every 3-5 hours . Avoid concentrated sugars and fried foods . Keep sugar & fat in the single digits per serving on food labels . Practice CHEWING your food (aim for applesauce consistency) . Practice not drinking 15 minutes before, during, and 30 minutes after each meal and snack . Avoid all carbonated beverages (ex: soda, sparkling beverages)  . Limit caffeinated beverages (ex: coffee, tea, energy drinks) . Avoid all sugar-sweetened beverages (ex: regular soda, sports drinks)  . Avoid alcohol  . Aim for 64-100 ounces of FLUID daily (with at least half of fluid intake being plain water)  . Aim for at least 60-80 grams of PROTEIN daily . Look for a liquid protein source that contains ?15 g protein and ?5 g carbohydrate (ex: shakes, drinks, shots) . Make a list of non-food related activities . Physical activity is an important part of a healthy lifestyle so keep it moving! The goal is to reach 150 minutes of exercise per week, including cardiovascular and weight baring activity.  *Goals that are bolded indicate the pt would like to start working towards these  Handouts Provided Include  . Bariatric Surgery handouts (Nutrition Visits, Pre-Op Goals, Protein Shakes, Vitamins & Minerals) . MyPlate Portions & Meal Ideas  Learning Style & Readiness for Change Teaching method utilized: Visual & Auditory  Demonstrated degree of understanding via: Teach Back  Barriers to learning/adherence to lifestyle change: None Identified    MONITORING & EVALUATION Dietary intake, weekly physical activity, body weight, and pre-op goals reached at next nutrition visit.   Next Steps Patient is to return to [facility] in 1 month for 1st SWL Visit.

## 2020-03-11 ENCOUNTER — Ambulatory Visit: Payer: 59 | Admitting: Dietician

## 2020-06-11 ENCOUNTER — Encounter (HOSPITAL_COMMUNITY): Payer: Self-pay | Admitting: Emergency Medicine

## 2020-06-11 ENCOUNTER — Emergency Department (HOSPITAL_COMMUNITY)
Admission: EM | Admit: 2020-06-11 | Discharge: 2020-06-11 | Disposition: A | Discharge status: Home / Self Care | Payer: 59 | Attending: Emergency Medicine | Admitting: Emergency Medicine

## 2020-06-11 DIAGNOSIS — R739 Hyperglycemia, unspecified: Secondary | ICD-10-CM | POA: Insufficient documentation

## 2020-06-11 DIAGNOSIS — Z5321 Procedure and treatment not carried out due to patient leaving prior to being seen by health care provider: Secondary | ICD-10-CM | POA: Insufficient documentation

## 2020-06-11 LAB — CBC
HCT: 52.4 % — ABNORMAL HIGH (ref 36.0–46.0)
Hemoglobin: 17.1 g/dL — ABNORMAL HIGH (ref 12.0–15.0)
MCH: 27.7 pg (ref 26.0–34.0)
MCHC: 32.6 g/dL (ref 30.0–36.0)
MCV: 84.8 fL (ref 80.0–100.0)
Platelets: 236 10*3/uL (ref 150–400)
RBC: 6.18 MIL/uL — ABNORMAL HIGH (ref 3.87–5.11)
RDW: 12.7 % (ref 11.5–15.5)
WBC: 7.7 10*3/uL (ref 4.0–10.5)
nRBC: 0 % (ref 0.0–0.2)

## 2020-06-11 LAB — BASIC METABOLIC PANEL
Anion gap: 14 (ref 5–15)
BUN: 16 mg/dL (ref 6–20)
CO2: 22 mmol/L (ref 22–32)
Calcium: 10.4 mg/dL — ABNORMAL HIGH (ref 8.9–10.3)
Chloride: 99 mmol/L (ref 98–111)
Creatinine, Ser: 1.04 mg/dL — ABNORMAL HIGH (ref 0.44–1.00)
GFR calc Af Amer: >60 mL/min (ref >60)
GFR calc non Af Amer: >60 mL/min (ref >60)
Glucose, Bld: 295 mg/dL — ABNORMAL HIGH (ref 70–99)
Potassium: 3.4 mmol/L — ABNORMAL LOW (ref 3.5–5.1)
Sodium: 135 mmol/L (ref 135–145)

## 2020-06-11 LAB — URINALYSIS, ROUTINE W REFLEX MICROSCOPIC
Bilirubin Urine: NEGATIVE
Glucose, UA: >=500 mg/dL — AB
Hgb urine dipstick: NEGATIVE
Ketones, ur: NEGATIVE mg/dL
Leukocytes,Ua: NEGATIVE
Nitrite: NEGATIVE
Protein, ur: NEGATIVE mg/dL
Specific Gravity, Urine: 1.031 — ABNORMAL HIGH (ref 1.005–1.030)
pH: 6 (ref 5.0–8.0)

## 2020-06-11 LAB — CBG MONITORING, ED
Glucose-Capillary: 286 mg/dL — ABNORMAL HIGH (ref 70–99)
Glucose-Capillary: 208 mg/dL — ABNORMAL HIGH (ref 70–99)

## 2020-06-11 MED ORDER — ONDANSETRON 4 MG PO TBDP
4.0000 mg | ORAL_TABLET | Freq: Once | ORAL | Status: DC
  Filled 2020-06-11: qty 1

## 2020-06-11 NOTE — ED Notes (Signed)
Pt stated that she is going to [facility] and will return to ED lobby with her food

## 2020-06-11 NOTE — ED Triage Notes (Signed)
Pt here from work with c/o hyperglycemia , she has been out of meds for about 3 weeks , 451 at home , 286 on arrival

## 2020-06-11 NOTE — ED Notes (Signed)
Pt called for vital sign update. Pt stated that she is leaving [facility] ED due to the anticipated wait time to see a provider, and then she left the facility.

## 2020-06-25 ENCOUNTER — Encounter: Payer: Self-pay | Admitting: Internal Medicine

## 2020-08-07 ENCOUNTER — Other Ambulatory Visit: Payer: Self-pay

## 2020-08-25 ENCOUNTER — Telehealth: Payer: Self-pay | Admitting: *Deleted

## 2020-08-25 NOTE — Telephone Encounter (Signed)
Still no answer from pt. No show letter mailed to pt today and colon & PV cancelled.

## 2020-08-25 NOTE — Telephone Encounter (Signed)
Patient no show PV today, called pt no answer, left a message to call us back before 5 pm to reschedule PV.

## 2020-09-08 ENCOUNTER — Encounter: Payer: 59 | Admitting: Internal Medicine

## 2020-09-10 ENCOUNTER — Ambulatory Visit (HOSPITAL_COMMUNITY)
Admission: RE | Admit: 2020-09-10 | Discharge: 2020-09-10 | Disposition: A | Discharge status: Home / Self Care | Payer: 59 | Source: Ambulatory Visit | Attending: Neurological Surgery | Admitting: Neurological Surgery

## 2020-09-10 ENCOUNTER — Other Ambulatory Visit: Payer: Self-pay | Admitting: Neurological Surgery

## 2020-09-10 ENCOUNTER — Other Ambulatory Visit: Payer: Self-pay

## 2020-09-10 DIAGNOSIS — D320 Benign neoplasm of cerebral meninges: Secondary | ICD-10-CM

## 2020-10-03 ENCOUNTER — Emergency Department (HOSPITAL_COMMUNITY)
Admission: EM | Admit: 2020-10-03 | Discharge: 2020-10-03 | Disposition: A | Discharge status: Home / Self Care | Payer: 59 | Attending: Emergency Medicine | Admitting: Emergency Medicine

## 2020-10-03 ENCOUNTER — Emergency Department (HOSPITAL_COMMUNITY): Payer: 59

## 2020-10-03 ENCOUNTER — Other Ambulatory Visit: Payer: Self-pay

## 2020-10-03 ENCOUNTER — Encounter (HOSPITAL_COMMUNITY): Payer: Self-pay | Admitting: Emergency Medicine

## 2020-10-03 DIAGNOSIS — R519 Headache, unspecified: Secondary | ICD-10-CM | POA: Insufficient documentation

## 2020-10-03 DIAGNOSIS — F419 Anxiety disorder, unspecified: Secondary | ICD-10-CM | POA: Diagnosis not present

## 2020-10-03 DIAGNOSIS — Z7984 Long term (current) use of oral hypoglycemic drugs: Secondary | ICD-10-CM | POA: Diagnosis not present

## 2020-10-03 DIAGNOSIS — I10 Essential (primary) hypertension: Secondary | ICD-10-CM | POA: Insufficient documentation

## 2020-10-03 DIAGNOSIS — M79602 Pain in left arm: Secondary | ICD-10-CM

## 2020-10-03 DIAGNOSIS — R0789 Other chest pain: Secondary | ICD-10-CM | POA: Diagnosis not present

## 2020-10-03 DIAGNOSIS — M542 Cervicalgia: Secondary | ICD-10-CM | POA: Insufficient documentation

## 2020-10-03 DIAGNOSIS — E119 Type 2 diabetes mellitus without complications: Secondary | ICD-10-CM | POA: Diagnosis not present

## 2020-10-03 DIAGNOSIS — Z79899 Other long term (current) drug therapy: Secondary | ICD-10-CM | POA: Diagnosis not present

## 2020-10-03 LAB — BASIC METABOLIC PANEL
Anion gap: 10 (ref 5–15)
BUN: 15 mg/dL (ref 6–20)
CO2: 25 mmol/L (ref 22–32)
Calcium: 9.3 mg/dL (ref 8.9–10.3)
Chloride: 105 mmol/L (ref 98–111)
Creatinine, Ser: 0.88 mg/dL (ref 0.44–1.00)
GFR, Estimated: >60 mL/min (ref >60)
Glucose, Bld: 94 mg/dL (ref 70–99)
Potassium: 3.6 mmol/L (ref 3.5–5.1)
Sodium: 140 mmol/L (ref 135–145)

## 2020-10-03 LAB — CBC
HCT: 45.7 % (ref 36.0–46.0)
Hemoglobin: 14.5 g/dL (ref 12.0–15.0)
MCH: 27.5 pg (ref 26.0–34.0)
MCHC: 31.7 g/dL (ref 30.0–36.0)
MCV: 86.6 fL (ref 80.0–100.0)
Platelets: 259 10*3/uL (ref 150–400)
RBC: 5.28 MIL/uL — ABNORMAL HIGH (ref 3.87–5.11)
RDW: 12.6 % (ref 11.5–15.5)
WBC: 5 10*3/uL (ref 4.0–10.5)
nRBC: 0 % (ref 0.0–0.2)

## 2020-10-03 LAB — I-STAT BETA HCG BLOOD, ED (MC, WL, AP ONLY): I-stat hCG, quantitative: <5 m[IU]/mL (ref <5)

## 2020-10-03 LAB — TROPONIN I (HIGH SENSITIVITY)
Troponin I (High Sensitivity): <2 ng/L (ref <18)
Troponin I (High Sensitivity): <2 ng/L (ref <18)

## 2020-10-03 LAB — MAGNESIUM: Magnesium: 1.9 mg/dL (ref 1.7–2.4)

## 2020-10-03 MED ORDER — LORAZEPAM 2 MG/ML IJ SOLN
1.0000 mg | Freq: Once | INTRAMUSCULAR | Status: AC | PRN
  Administered 2020-10-03: 1 mg via INTRAVENOUS

## 2020-10-03 MED ORDER — HYDROXYZINE HCL 25 MG PO TABS: 25.0000 mg | ORAL_TABLET | Freq: Three times a day (TID) | ORAL | 0 refills | Status: DC | PRN

## 2020-10-03 MED ORDER — METHOCARBAMOL 500 MG PO TABS: 500.0000 mg | ORAL_TABLET | Freq: Every evening | ORAL | 0 refills | Status: DC | PRN

## 2020-10-03 MED ORDER — LIDOCAINE 5 % EX PTCH: 1.0000 | MEDICATED_PATCH | CUTANEOUS | 0 refills | Status: DC

## 2020-10-03 MED ORDER — GADOBUTROL 1 MMOL/ML IV SOLN
10.0000 mL | Freq: Once | INTRAVENOUS | Status: AC | PRN
  Administered 2020-10-03: 10 mL via INTRAVENOUS

## 2020-10-03 MED ORDER — LORAZEPAM 2 MG/ML IJ SOLN
1.0000 mg | Freq: Once | INTRAMUSCULAR | Status: AC
  Administered 2020-10-03: 1 mg via INTRAVENOUS
  Filled 2020-10-03: qty 1

## 2020-10-03 NOTE — ED Triage Notes (Addendum)
Pt states last night she got a sharp pain in her head that shot down her neck, states she woke up with severe L arm pain and numbness (elbow down to wrist). Also endorses heaviness in her chest. Pt a/ox4, speech clear, face symmetrical, grip strength equal, pt moves all limbs equally. Reports she had brain surgery last year to have a brain tumor removed, states that part of the tumor could not be removed due to it touching a nerve.

## 2020-10-03 NOTE — ED Notes (Signed)
Pt still in MRI 

## 2020-10-03 NOTE — Discharge Instructions (Signed)
Continue taking her medications as prescribed. Take 2 Aleve twice a day with meals to help with pain and swelling. Use Lidoderm patch to help with pain. Use muscle relaxer as needed for stiffness or soreness. Have caution, this may make you tired or groggy. You may take your gabapentin as needed for pain control. Use hydroxyzine as needed for anxiety. If this is helping you want refills, or if this did not help with usual further evaluation, follow-up with your primary care doctor. Call your neurosurgeon for further evaluation management of your tumor and arm pain. Return to the emergency room with any new, worsening, concerning symptoms.

## 2020-10-03 NOTE — ED Notes (Signed)
Pt returned from MRI °

## 2020-10-03 NOTE — ED Provider Notes (Signed)
[facility] EMERGENCY DEPARTMENT Provider Note   CSN: [id number] Arrival date & time: 10/03/20  2831     History Chief Complaint  Patient presents with  . Arm Pain  . Headache  . Chest Pain    Carolyn Holland is a 49 y.o. female presenting for evaluation of headache, neck pain, arm pain, and chest tightness.  Patient states last night she had acute onset severe sharp pain in the left side of her head.  Her pain radiated into the left side of her neck and into her shoulder and arm.  She then developed severe pain in her hand.  She felt she was having difficulty closing it due to pain.  At that time, she also had fasciculations/twitching of the left side of her cheek.  She took gabapentin and Aleve, reports head pain has resolved and fasciculations have resolved.  She continues to have pain in the left side of her neck and her left hand.  She denies fall, trauma, or injury.  She denies change in physical activity or heavy lifting.  She denies history of similar.  She denies pain on the right side.  She denies recent fevers, chills, cough, shortness of breath, nausea vomiting abdominal pain, urinary symptoms, normal bowel movements.  She states she had a brain tumor resected with Dr. Zada Finders in 2020.  They were unable to completely resect the mass due to it laying on a nerve.  She has not followed up with Dr. Zada Finders recently.  She denies change in diet.  Pain is not worse with movement and palpation.  Nothing makes it better or worse. Additionally, patient reports since this episode she has had chest tightness.  She does report a history of anxiety, and states she feels very anxious.  She denies previous history of heart problems.  Additional history taken chart review.  Patient with a history of anxiety, diabetes, GERD, hypertension, hyperlipidemia meningioma  HPI     Past Medical History:  Diagnosis Date  . Anemia 12/22/2012  . Anxiety 2012  . Arthritis    RA  .  Bilateral anterior knee pain 2012  . Diabetes mellitus without complication ([place])   . GERD (gastroesophageal reflux disease) 2000   come and go  . Headache    As a child  . Hyperlipidemia   . Hypertension 2008  . Panic attack 2012   situational related to move from [place].     Patient Active Problem List   Diagnosis Date Noted  . Status post craniotomy 07/02/2019  . Brain tumor ([place]) 07/02/2019  . Palpitations 12/20/2017  . Essential hypertension 12/20/2017  . Hypercholesterolemia 12/20/2017  . Varicose veins of both lower extremities with pain 12/20/2017  . Medication management 02/11/2017  . Elevated LDL cholesterol level 02/11/2017  . Facial twitching   . Chest pain   . TIA (transient ischemic attack) 11/07/2014  . Hot flashes 12/14/2013  . Venous insufficiency 12/11/2013  . Acid reflux 10/04/2013  . Obesity 08/23/2013  . Atypical chest pain 06/15/2013  . Obstructive sleep apnea 03/02/2013  . Right hip pain 01/29/2013  . Diabetes mellitus, type 2 ([place]) 12/29/2012  . Vitamin D deficiency 12/22/2012  . Anxiety   . Benign hypertension   . Bilateral anterior knee pain     Past Surgical History:  Procedure Laterality Date  . ABDOMINAL HYSTERECTOMY    . APPLICATION OF CRANIAL NAVIGATION N/A 07/02/2019   Procedure: APPLICATION OF CRANIAL NAVIGATION;  Surgeon: Judith Part, MD;  Location: [facility]  OR;  Service: Neurosurgery;  Laterality: N/A;  . CESAREAN SECTION  2002  . laproscopic knee surgery  2011   L knee for bilateral meniscal tear. Following acute injury.   [name] PARTIAL HYSTERECTOMY  2009  . RETROSIGMOID CRANIECTOMY FOR TUMOR RESECTION Left 07/02/2019   Procedure: Left Retrosigmoid craniotomy for tumor resection;  Surgeon: Judith Part, MD;  Location: [facility];  Service: Neurosurgery;  Laterality: Left;     OB History    Gravida  4   Para  3   Term  3   Preterm      AB  1   Living  3     SAB      TAB  1   Ectopic      Multiple      Live  Births              Family History  Problem Relation Age of Onset  . Diabetes Mother   . Hypertension Mother   . Heart disease Mother 49  . Stroke Mother 40       x 2  . Alcohol abuse Mother        previous   . Drug abuse Mother        previous cocaine   . Hypertension Father     Social History   Tobacco Use  . Smoking status: Never Smoker  . Smokeless tobacco: Never Used  Vaping Use  . Vaping Use: Never used  Substance Use Topics  . Alcohol use: No    Alcohol/week: 0.0 standard drinks    Comment: occassional, social, wine  . Drug use: No    Home Medications Prior to Admission medications   Medication Sig Start Date End Date Taking? Authorizing Provider  chlorthalidone (HYGROTON) 25 MG tablet Take 25 mg by mouth daily at 2 PM.    Yes [provider]  cholecalciferol (VITAMIN D3) 25 MCG (1000 UNIT) tablet Take 1,000 Units by mouth daily.   Yes [provider]  diclofenac sodium (VOLTAREN) 1 % GEL Apply 1-2 g topically 4 (four) times daily as needed for pain. 05/12/19  Yes [provider]  docusate sodium (COLACE) 100 MG capsule Take 1 capsule (100 mg total) by mouth 2 (two) times daily. 07/05/19  Yes Judith Part, MD  gabapentin (NEURONTIN) 300 MG capsule Take 300 mg by mouth 3 (three) times daily. 09/22/20  Yes [provider]  glimepiride (AMARYL) 2 MG tablet Take 2 mg by mouth 2 (two) times daily as needed (elevated sugar level).  07/17/20  Yes [provider]  JARDIANCE 25 MG TABS tablet Take 25 mg by mouth daily. 10/02/20  Yes [provider]  LORazepam (ATIVAN) 2 MG tablet Take 2 mg by mouth daily as needed for anxiety.   Yes [provider]  metFORMIN (GLUCOPHAGE-XR) 500 MG 24 hr tablet Take 500 mg by mouth daily. 02/21/19  Yes [provider]  omeprazole (PRILOSEC) 40 MG capsule Take 40 mg by mouth daily as needed (acid reflux/indigestion.).   Yes [provider]  ondansetron  (ZOFRAN) 4 MG tablet Take 1 tablet (4 mg total) by mouth every 8 (eight) hours as needed for nausea or vomiting. 07/05/19  Yes Judith Part, MD  phenazopyridine (PYRIDIUM) 200 MG tablet Take 1 tablet (200 mg total) by mouth 3 (three) times daily. Patient taking differently: Take 200 mg by mouth 3 (three) times daily as needed for pain.  07/27/19  Yes Tacy Learn, PA-C  potassium chloride SA (KLOR-CON) 20 MEQ tablet Take 20 mEq by mouth 2 (two) times daily. 07/30/20  Yes [provider]  rosuvastatin (CRESTOR) 20 MG tablet Take 20 mg by mouth daily.    Yes [provider]  TRULICITY 2.35 TD/3.2KG SOPN Inject 0.75 mg into the skin once a week. Thursday 08/27/20  Yes [provider]  vitamin C (ASCORBIC ACID) 250 MG tablet Take 250 mg by mouth daily.   Yes [provider]  canagliflozin (INVOKANA) 100 MG TABS tablet Take 1 tablet (100 mg total) by mouth daily before breakfast. Patient not taking: Reported on 10/03/2020 07/05/19   Judith Part, MD  Dulaglutide (TRULICITY) 1.5 UR/4.2HC SOPN Inject 1.5 mg into the skin once a week.    [provider]  hydrOXYzine (ATARAX/VISTARIL) 25 MG tablet Take 1 tablet (25 mg total) by mouth every 8 (eight) hours as needed. 10/03/20   Monik Lins, PA-C  lidocaine (LIDODERM) 5 % Place 1 patch onto the skin daily. Remove & Discard patch within 12 hours or as directed by MD 10/03/20   Abdulrahman Bracey, PA-C  methocarbamol (ROBAXIN) 500 MG tablet Take 1 tablet (500 mg total) by mouth at bedtime as needed for muscle spasms. 10/03/20   Armonie Mettler, PA-C  nitrofurantoin, macrocrystal-monohydrate, (MACROBID) 100 MG capsule Take 1 capsule (100 mg total) by mouth 2 (two) times daily. Patient not taking: Reported on 10/03/2020 07/27/19   Suella Broad A, PA-C  oxyCODONE 10 MG TABS Take 1 tablet (10 mg total) by mouth every 4 (four) hours as needed (pain). Patient not taking: Reported on 10/03/2020 07/05/19    Judith Part, MD  potassium chloride (K-DUR) 10 MEQ tablet Take 1 tablet (10 mEq total) by mouth daily. 01/29/19 06/19/20  Almyra Deforest, PA    Allergies    Penicillins  Review of Systems   Review of Systems  Respiratory: Positive for chest tightness.   Musculoskeletal: Positive for myalgias and neck pain (L side).  Neurological: Positive for headaches (resolved).  Psychiatric/Behavioral: The patient is nervous/anxious.   All other systems reviewed and are negative.   Physical Exam Updated Vital Signs BP 131/81   Pulse 84   Temp 98.3 F (36.8 C) (Oral)   Resp 16   Ht 5\' 6"  (1.676 m)   Wt 105.7 kg   SpO2 100%   BMI 37.61 kg/m   Physical Exam Vitals and nursing note reviewed.  Constitutional:      General: She is not in acute distress.    Appearance: She is well-developed.     Comments: Appears anxious, but otherwise nontoxic  HENT:     Head: Normocephalic and atraumatic.     Comments: No tenderness palpation of the left temple Eyes:     Extraocular Movements: Extraocular movements intact.     Conjunctiva/sclera: Conjunctivae normal.     Pupils: Pupils are equal, round, and reactive to light.     Comments: EOMI and PERRLA.  Neck:     Comments: No TTP of midline C-spine.  Full active range of motion of the head without pain Cardiovascular:     Rate and Rhythm: Normal rate and regular rhythm.     Pulses: Normal pulses.  Pulmonary:     Effort: Pulmonary effort is normal. No respiratory distress.     Breath sounds: Normal breath sounds. No wheezing.  Abdominal:     General: There is no distension.     Palpations: Abdomen is soft. There is no mass.  Tenderness: There is no abdominal tenderness. There is no guarding or rebound.  Musculoskeletal:        General: Normal range of motion.     Cervical back: Normal range of motion and neck supple.     Comments: Strength and sensation intact x4.  Radial pedal pulses 2+ bilaterally.  Full active range of motion of upper  extremities without pain or discomfort.  Skin:    General: Skin is warm and dry.     Capillary Refill: Capillary refill takes less than 2 seconds.  Neurological:     Mental Status: She is alert and oriented to person, place, and time.     ED Results / Procedures / Treatments   Labs (all labs ordered are listed, but only abnormal results are displayed) Labs Reviewed  CBC - Abnormal; Notable for the following components:      Result Value   RBC 5.28 (*)    All other components within normal limits  BASIC METABOLIC PANEL  MAGNESIUM  I-STAT BETA HCG BLOOD, ED ([facility], [facility], [facility] ONLY)  TROPONIN I (HIGH SENSITIVITY)  TROPONIN I (HIGH SENSITIVITY)    EKG EKG Interpretation  Date/Time:  Friday October 03 2020 06:05:55 EST Ventricular Rate:  69 PR Interval:  186 QRS Duration: 82 QT Interval:  378 QTC Calculation: 405 R Axis:   30 Text Interpretation: Normal sinus rhythm Low voltage QRS Septal infarct , age undetermined Abnormal ECG When compared with ECG of 02/01/2020, No significant change was found Confirmed by Delora Fuel ([id number]) on 10/03/2020 6:15:26 AM   Radiology DG Chest 2 View  Result Date: 10/03/2020 CLINICAL DATA:  Chest pain. EXAM: CHEST - 2 VIEW COMPARISON:  07/02/2019 FINDINGS: The cardiac silhouette, mediastinal and hilar contours are normal. The lungs are clear. No pleural effusion or pulmonary lesions. The bony thorax is intact. IMPRESSION: No acute cardiopulmonary findings. Electronically Signed   By: Marijo Sanes M.D.   On: 10/03/2020 06:35   MR Angiogram Neck W or Wo Contrast  Result Date: 10/03/2020 CLINICAL DATA:  Carotid artery aneurysm suspected. EXAM: MRA NECK WITHOUT AND WITH CONTRAST TECHNIQUE: Multiplanar and multiecho pulse sequences of the neck were obtained without and with intravenous contrast. Angiographic images of the neck were obtained using MRA technique without and with intravenous contrast. CONTRAST:  56mL GADAVIST GADOBUTROL 1 MMOL/ML IV SOLN  COMPARISON:  No pertinent prior exams are available for comparison. FINDINGS: Standard aortic branching. The bilateral common and internal carotid arteries are patent within the neck without evidence of hemodynamically significant stenosis. The vertebral arteries are codominant and patent within the neck with antegrade flow, and without evidence of hemodynamically significant stenosis. IMPRESSION: IMPRESSION The common carotid, internal carotid and vertebral arteries are patent within the neck without hemodynamically significant stenosis. No carotid aneurysm is identified within the neck. Electronically Signed   By: Kellie Simmering DO   On: 10/03/2020 12:59   MR Brain W and Wo Contrast  Result Date: 10/03/2020 CLINICAL DATA:  Brain mass or lesion. Additional history provided: Patient reports sharp pain in head radiating to neck, patient reports she awoke with severe left arm pain and numbness (elbow down to wrist), heaviness in chest. EXAM: MRI HEAD WITHOUT AND WITH CONTRAST TECHNIQUE: Multiplanar, multiecho pulse sequences of the brain and surrounding structures were obtained without and with intravenous contrast. CONTRAST:  34mL GADAVIST GADOBUTROL 1 MMOL/ML IV SOLN COMPARISON:  Head CT 09/10/2020. Prior brain MRI examinations 02/11/2020 and earlier. FINDINGS: Brain: Mild intermittent motion degradation. This includes mild motion  degradation of the postcontrast T1 weighted sequences. 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 Residual meningioma along the left tentorium, similar to slightly increased in size as compared to the brain MRI of 02/11/2020. This measures 1.7 x 0.6 cm in transaxial dimensions on the current exam (previously 1.4 x 0.6 cm, remeasured on prior). Mild mass effect upon the adjacent pons. Not significantly changed, enhancing tumor extends anteriorly toward Meckel's cave and possibly into the cavernous sinus region, as well as into the medial  aspect of the left middle cranial fossa. Unchanged subtle dural thickening and enhancement overlying the ventral pons (left greater than right) and along the margin of the left porous acusticus. There is no acute infarct. No chronic intracranial blood products. No extra-axial fluid collection. No midline shift. Partially empty sella turcica. Vascular: Normal proximal arterial flow voids. Expected vascular enhancement. Skull and upper cervical spine: Left occipital cranioplasty. No other focal marrow lesion. Sinuses/Orbits: Visualized orbits show no acute finding. No significant paranasal sinus disease. IMPRESSION: Mildly motion degraded examination. Prior left cerebellopontine angle meningioma resection. Residual enhancing tumor along the left tentorium is similar to slightly increased in size since the MRI of 02/11/2020, now 1.7 x 0.6 cm. Mild mass effect upon the adjacent pons. As before, tumor extends anteriorly toward Meckel's cave and possibly into the cavernous sinus region, as well as into the medial aspect of the left middle cranial fossa. Unchanged subtle dural thickening and enhancement overlying the ventral pons and along the margin of the left porous acusticus, which is nonspecific but could also reflect residual tumor. No evidence of acute infarct. Partially empty sella turcica. This finding is very commonly incidental, but can be associated with idiopathic intracranial hypertension. Electronically Signed   By: Kellie Simmering DO   On: 10/03/2020 12:40   MR Cervical Spine W or Wo Contrast  Result Date: 10/03/2020 CLINICAL DATA:  Neck pain, acute, known malignancy. EXAM: MRI CERVICAL SPINE WITHOUT AND WITH CONTRAST TECHNIQUE: Multiplanar and multiecho pulse sequences of the cervical spine, to include the craniocervical junction and cervicothoracic junction, were obtained without and with intravenous contrast. CONTRAST:  80mL GADAVIST GADOBUTROL 1 MMOL/ML IV SOLN COMPARISON:  Cervical spine MRI  05/23/2019. FINDINGS: Mild intermittent motion degradation. Alignment: Subtle nonspecific reversal of the expected cervical lordosis. No significant spondylolisthesis. Vertebrae: Vertebral body height is maintained. No significant marrow edema or focal suspicious osseous lesion. Cord: No spinal cord signal abnormality or abnormal spinal cord enhancement. Posterior Fossa, vertebral arteries, paraspinal tissues: Posterior fossa better assessed on concurrently performed brain MRI. Flow voids preserved within the imaged cervical vertebral arteries. Paraspinal soft tissues within normal limits. Disc levels: Unless otherwise stated, the level by level findings below have not significantly changed since prior MRI 05/23/2019. Mild multilevel disc degeneration. C2-C3: No significant disc herniation or stenosis. C3-C4: No significant disc herniation or stenosis. C4-C5: Minimal facet hypertrophy. No significant disc herniation or stenosis. C5-C6: Small central disc protrusion. No significant spinal canal stenosis or foraminal narrowing. C6-C7: No significant disc herniation or stenosis. C7-T1: No significant disc herniation or stenosis. IMPRESSION: Mildly motion degraded examination. Mild cervical spondylosis as outlined unchanged from the MRI of 05/23/2019. Most notably, there is a small C5-C6 central disc protrusion. No significant spinal canal or foraminal stenosis at any level. No suspicious osseous lesion. No spinal cord signal abnormality or abnormal spinal cord enhancement. Electronically Signed   By: Kellie Simmering DO   On: 10/03/2020 12:52    Procedures Procedures (including critical care  time)  Medications Ordered in ED Medications  LORazepam (ATIVAN) injection 1 mg (1 mg Intravenous Given 10/03/20 1025)  LORazepam (ATIVAN) injection 1 mg (1 mg Intravenous Given 10/03/20 1027)  gadobutrol (GADAVIST) 1 MMOL/ML injection 10 mL (10 mLs Intravenous Contrast Given 10/03/20 1212)    ED Course  I have reviewed  the triage vital signs and the nursing notes.  Pertinent labs & imaging results that were available during my care of the patient were reviewed by me and considered in my medical decision making (see chart for details).    MDM Rules/Calculators/A&P                          Patient presenting for evaluation of headache, neck pain, arm pain, and chest tightness.  On exam, patient appears nontoxic.  She is neurovascular intact.  The symptoms have improved, except for the hand pain.  Less likely radiculopathy.  Also consider complex migraine.  However as patient does have a history of brain mass, will obtain MRI head and C-spine for further evaluation.  Patient's chest tightness is likely secondary to anxiety, however considering her medical history will obtain troponin, EKG, chest x-ray.  Labs interpreted by me, overall reassuring.  Initial troponin negative.  Electrolytes stable.  EKG unchanged from previous.  Chest x-ray viewed interpreted by me, no pneumonia pneumothorax or effusion.  MRIs pending.  MRI continues to show tumor, concern for possible margin with possible mass-effect on pons.  MRI C-spine shows disc protrusion, this may be contributing to patient's radiculopathy symptoms.  MRA negative.  Will consult with neurosurgery.  Discussed with Dr. Christella Noa from neurosurgery who reviewed the imaging, does not feel that there has been any growth of the tumor.  Does not recommend any emergent treatment at this time including steroids.  Recommends outpatient follow-up in the office.    Discussed findings and plan with patient, who is agreeable.  Discussed symptomatic treatment for radiculopathy and importance of close follow-up.  Patient continues to be very anxious.  Discussed use of hydroxyzine and follow-up with primary care.  At this time, patient appears safe for discharge.  Return precautions given.  Patient states she understands and agrees to plan.  Final Clinical Impression(s) / ED  Diagnoses Final diagnoses:  Left arm pain  Anxiety    Rx / DC Orders ED Discharge Orders         Ordered    methocarbamol (ROBAXIN) 500 MG tablet  At bedtime PRN        10/03/20 1446    lidocaine (LIDODERM) 5 %  Every 24 hours        10/03/20 1446    hydrOXYzine (ATARAX/VISTARIL) 25 MG tablet  Every 8 hours PRN        10/03/20 1447           Ahava Kissoon, PA-C 10/03/20 1457    Carmin Muskrat, MD 10/06/20 2346

## 2020-10-03 NOTE — ED Notes (Signed)
DC instructions reviewed with Pt.  Pt verbalized understanding of instructions.  Pt DC

## 2021-04-28 ENCOUNTER — Encounter: Payer: Self-pay | Admitting: Nurse Practitioner

## 2021-04-30 ENCOUNTER — Other Ambulatory Visit: Payer: Self-pay

## 2021-05-29 ENCOUNTER — Other Ambulatory Visit (INDEPENDENT_AMBULATORY_CARE_PROVIDER_SITE_OTHER): Payer: 59

## 2021-05-29 ENCOUNTER — Ambulatory Visit (INDEPENDENT_AMBULATORY_CARE_PROVIDER_SITE_OTHER): Payer: 59 | Admitting: Nurse Practitioner

## 2021-05-29 ENCOUNTER — Encounter: Payer: Self-pay | Admitting: Nurse Practitioner

## 2021-05-29 VITALS — BP 128/90 | HR 84 | Ht 65.0 in | Wt 248.4 lb

## 2021-05-29 DIAGNOSIS — K648 Other hemorrhoids: Secondary | ICD-10-CM

## 2021-05-29 DIAGNOSIS — K644 Residual hemorrhoidal skin tags: Secondary | ICD-10-CM

## 2021-05-29 DIAGNOSIS — K625 Hemorrhage of anus and rectum: Secondary | ICD-10-CM | POA: Diagnosis not present

## 2021-05-29 LAB — CBC
HCT: 43.1 % (ref 36.0–46.0)
Hemoglobin: 14.7 g/dL (ref 12.0–15.0)
MCHC: 34.1 g/dL (ref 30.0–36.0)
MCV: 83.4 fl (ref 78.0–100.0)
Platelets: 229 10*3/uL (ref 150.0–400.0)
RBC: 5.17 Mil/uL — ABNORMAL HIGH (ref 3.87–5.11)
RDW: 14.4 % (ref 11.5–15.5)
WBC: 5.7 10*3/uL (ref 4.0–10.5)

## 2021-05-29 MED ORDER — HYDROCORTISONE ACETATE 25 MG RE SUPP: 25.0000 mg | Freq: Every evening | RECTAL | 0 refills | Status: AC

## 2021-05-29 MED ORDER — SUPREP BOWEL PREP KIT 17.5-3.13-1.6 GM/177ML PO SOLN: 1.0000 | ORAL | 0 refills | Status: DC

## 2021-05-29 NOTE — Progress Notes (Addendum)
05/29/2021 Carolyn Holland [id number] 10/15/1971   CHIEF COMPLAINT: Rectal bleeding  HISTORY OF PRESENT ILLNESS:  Carolyn Holland is a 50 year old female with a past medical history of anxiety, rheumatoid arthritis, hypertension, hyperlipidemia, venous insufficiency, DM II, meningioma s/p craniotomy and resection 06/2019, (denies history of OSA) and GERD. She presents to our office today as referred by Dr. Linda Hedges for further evaluation regarding rectal bleeding.  In early May, she passed a normal bowel movement and saw a small amount of bright red blood on the stool which occurred for 2 days and on the third day she passed a bowel movement with a moderate amount of bright red blood on the toilet tissue.  Was seen by her PCP who diagnosed her with hemorrhoids and prescribed  Hydrocortisone suppositories bid x 10 day May 2022 and her rectal bleeding resolved.  However, yesterday she passed 2 bowel movements and noticed blood was intertwined on the stool and earlier today she passed gas which included passing a small red blood clot which was quite alarming to her.  Passes 2-4 soft formed bowel movements most days.  She has intermittent constipation and takes senna laxative tab once or twice weekly as needed.  No abdominal or rectal pain.  No known family history of colorectal cancer.  She is anxious regarding her rectal bleeding.  No other complaints at this time.  She has a history of a meningioma s/p craniotomy with resection in 06/2019.  She has not seen her neurosurgeon for the past year.  She underwent a brain MRI 10/03/2020 when she was in the ED for headache, neck and arm pain which was reviewed by neurosurgery and was unchanged when compared to a prior brain image study.  She was advised to follow-up in the neurosurgical outpatient clinic which was not done.  She intends to schedule a follow-up with Dr. Zada Finders in the near future.  She denies having any headaches, visual changes or dizziness.  She  has chronic numbness to the left side of her head which is been present since her craniotomy surgery and has not changed.  CBC Latest Ref Rng & Units 10/03/2020 06/11/2020 07/03/2019  WBC 4.0 - 10.5 K/uL 5.0 7.7 10.5  Hemoglobin 12.0 - 15.0 g/dL 14.5 17.1(H) 14.0  Hematocrit 36.0 - 46.0 % 45.7 52.4(H) 43.2  Platelets 150 - 400 K/uL 259 236 209    CMP Latest Ref Rng & Units 10/03/2020 06/11/2020 07/03/2019  Glucose 70 - 99 mg/dL 94 295(H) 241(H)  BUN 6 - 20 mg/dL 15 16 12   Creatinine 0.44 - 1.00 mg/dL 0.88 1.04(H) 0.66  Sodium 135 - 145 mmol/L 140 135 138  Potassium 3.5 - 5.1 mmol/L 3.6 3.4(L) 3.6  Chloride 98 - 111 mmol/L 105 99 105  CO2 22 - 32 mmol/L 25 22 22   Calcium 8.9 - 10.3 mg/dL 9.3 10.4(H) 8.4(L)  Total Protein 6.5 - 8.1 g/dL - - -  Total Bilirubin 0.3 - 1.2 mg/dL - - -  Alkaline Phos 38 - 126 U/L - - -  AST 15 - 41 U/L - - -  ALT 0 - 44 U/L - - -    Brain MRI 10/03/2020 done in the ED.  Mildly motion degraded examination.  Prior left cerebellopontine angle meningioma resection. Residual enhancing tumor along the left tentorium is similar to slightly increased in size since the MRI of 02/11/2020, now 1.7 x 0.6 cm. Mild mass effect upon the adjacent pons. As before, tumor extends anteriorly toward Meckel's  cave and possibly into the cavernous sinus region, as well as into the medial aspect of the left middle cranial fossa.  Unchanged subtle dural thickening and enhancement overlying the ventral pons and along the margin of the left porous acusticus, which is nonspecific but could also reflect residual tumor.  No evidence of acute infarct.  Partially empty sella turcica. This finding is very commonly incidental, but can be associated with idiopathic intracranial hypertension. The ED physician discussed the above brain MRI results with Dr. Christella Noa from neurosurgery who reviewed the imaging, does not feel that there has been any growth of the tumor.  Does not recommend any  emergent treatment at this time including steroids.  Recommended outpatient follow-up in the office.      Past Medical History:  Diagnosis Date   Anemia 12/22/2012   Anxiety 2012   Arthritis    RA   Bilateral anterior knee pain 2012   Diabetes mellitus without complication ([place])    GERD (gastroesophageal reflux disease) 2000   come and go   Headache    As a child   Hyperlipidemia    Hypertension 2008   Panic attack 2012   situational related to move from [place].    Past Surgical History:  Procedure Laterality Date   ABDOMINAL HYSTERECTOMY     APPLICATION OF CRANIAL NAVIGATION N/A 07/02/2019   Procedure: APPLICATION OF CRANIAL NAVIGATION;  Surgeon: Judith Part, MD;  Location: [facility];  Service: Neurosurgery;  Laterality: N/A;   CESAREAN SECTION  2002   laproscopic knee surgery  2011   L knee for bilateral meniscal tear. Following acute injury.    PARTIAL HYSTERECTOMY  2009   RETROSIGMOID CRANIECTOMY FOR TUMOR RESECTION Left 07/02/2019   Procedure: Left Retrosigmoid craniotomy for tumor resection;  Surgeon: Judith Part, MD;  Location: [facility];  Service: Neurosurgery;  Laterality: Left;  She denies any problems with sedation, general anesthesia, airway management or intubation during past procedures/surgeries.  Social History: She is single.  She has 1 son and 2 daughters.  Non-smoker.  She drinks 3 glasses or less on the weekends. No drug use.   Family History: Mother age 57 with history. Father age 12 HTN. Maternal Grandfather lung cancer. Maternal aunt lung cancer.    Allergies  Allergen Reactions   Penicillins Hives, Rash and Other (See Comments)    Has patient had a PCN reaction causing immediate rash, facial/tongue/throat swelling, SOB or lightheadedness with hypotension: Yes Has patient had a PCN reaction causing severe rash involving mucus membranes or skin necrosis: No Has patient had a PCN reaction that required hospitalization emergency room visit but  did not stay Has patient had a PCN reaction occurring within the last 10 years: No If all of the above answers are "NO", then may proceed with Cephalosporin use        Outpatient Encounter Medications as of 05/29/2021  Medication Sig   ALPRAZolam (XANAX) 0.5 MG tablet alprazolam 0.5 mg tablet  TAKE 1 TABLET BY MOUTH THREE TIMES DAILY AS NEEDED FOR ANXIETY   canagliflozin (INVOKANA) 100 MG TABS tablet Take 1 tablet (100 mg total) by mouth daily before breakfast. (Patient not taking: Reported on 10/03/2020)   chlorthalidone (HYGROTON) 25 MG tablet Take 25 mg by mouth daily at 2 PM.    cholecalciferol (VITAMIN D3) 25 MCG (1000 UNIT) tablet Take 1,000 Units by mouth daily.   diclofenac sodium (VOLTAREN) 1 % GEL Apply 1-2 g topically 4 (four) times daily as needed for pain.  docusate sodium (COLACE) 100 MG capsule Take 1 capsule (100 mg total) by mouth 2 (two) times daily.   Dulaglutide (TRULICITY) 1.5 HA/1.9FX SOPN Inject 1.5 mg into the skin once a week.   gabapentin (NEURONTIN) 300 MG capsule Take 300 mg by mouth 3 (three) times daily.   glimepiride (AMARYL) 2 MG tablet Take 2 mg by mouth 2 (two) times daily as needed (elevated sugar level).    hydrocortisone (ANUCORT-HC) 25 MG suppository Anucort-HC 25 mg suppository   hydrOXYzine (ATARAX/VISTARIL) 25 MG tablet Take 1 tablet (25 mg total) by mouth every 8 (eight) hours as needed.   JARDIANCE 25 MG TABS tablet Take 25 mg by mouth daily.   lidocaine (LIDODERM) 5 % Place 1 patch onto the skin daily. Remove & Discard patch within 12 hours or as directed by MD   metFORMIN (GLUCOPHAGE-XR) 500 MG 24 hr tablet Take 500 mg by mouth daily.   metFORMIN (GLUCOPHAGE-XR) 500 MG 24 hr tablet Take by mouth.   methocarbamol (ROBAXIN) 500 MG tablet Take 1 tablet (500 mg total) by mouth at bedtime as needed for muscle spasms.   nitrofurantoin, macrocrystal-monohydrate, (MACROBID) 100 MG capsule Take 1 capsule (100 mg total) by mouth 2 (two) times daily.  (Patient not taking: Reported on 10/03/2020)   omeprazole (PRILOSEC) 40 MG capsule Take 40 mg by mouth daily as needed (acid reflux/indigestion.).   ondansetron (ZOFRAN) 4 MG tablet Take 1 tablet (4 mg total) by mouth every 8 (eight) hours as needed for nausea or vomiting.   oxyCODONE 10 MG TABS Take 1 tablet (10 mg total) by mouth every 4 (four) hours as needed (pain). (Patient not taking: Reported on 10/03/2020)   phenazopyridine (PYRIDIUM) 200 MG tablet Take 1 tablet (200 mg total) by mouth 3 (three) times daily. (Patient taking differently: Take 200 mg by mouth 3 (three) times daily as needed for pain. )   potassium chloride (K-DUR) 10 MEQ tablet Take 1 tablet (10 mEq total) by mouth daily.   potassium chloride SA (KLOR-CON) 20 MEQ tablet Take 20 mEq by mouth 2 (two) times daily.   promethazine-dextromethorphan (PROMETHAZINE-DM) 6.25-15 MG/5ML syrup promethazine-DM 6.25 mg-15 mg/5 mL oral syrup  TAKE 7.5 ML BY MOUTH FOUR TIMES DAILY   rosuvastatin (CRESTOR) 20 MG tablet Take 20 mg by mouth daily.    TRULICITY 9.02 IO/9.7DZ SOPN Inject 0.75 mg into the skin once a week. Thursday   vitamin C (ASCORBIC ACID) 250 MG tablet Take 250 mg by mouth daily.   No facility-administered encounter medications on file as of 05/29/2021.    REVIEW OF SYSTEMS:  Gen: Denies fever, sweats or chills. No weight loss.  CV: Denies chest pain, palpitations or edema. Resp: Denies cough, shortness of breath of hemoptysis.  GI: See HPI.  No GERD symptoms.   GU : Denies urinary burning, blood in urine, increased urinary frequency or incontinence. MS: Denies joint pain, muscles aches or weakness. Derm: Denies rash, itchiness, skin lesions or unhealing ulcers. Psych: Denies depression, anxiety, memory loss, suicidal ideation and confusion. Heme: Denies bruising, bleeding. Neuro: See HPI. Endo:  Denies any problems with DM, thyroid or adrenal function.  PHYSICAL EXAM: BP 128/90 (BP Location: Left Arm, Patient  Position: Sitting, Cuff Size: Large)   Pulse 84   Ht 5\' 5"  (1.651 m) Comment: height measured without shoes  Wt 248 lb 6 oz (112.7 kg)   BMI 41.33 kg/m   General: 50 year old female in no acute distress. Head: Normocephalic and atraumatic. Eyes:  Sclerae non-icteric, conjunctive  pink. Ears: Normal auditory acuity. Mouth: Dentition intact. No ulcers or lesions.  Neck: Supple, no lymphadenopathy or thyromegaly.  Lungs: Clear bilaterally to auscultation without wheezes, crackles or rhonchi. Heart: Regular rate and rhythm. No murmur, rub or gallop appreciated.  Abdomen: Soft, nontender, non distended. No masses. No hepatosplenomegaly. Normoactive bowel sounds x 4 quadrants.  Rectal: Noninflamed anterior external hemorrhoid.  Internal hemorrhoids without prolapse.  No blood or stool in the rectal vault.  CMA Melissa present during exam. Musculoskeletal: Symmetrical with no gross deformities. Skin: Warm and dry. No rash or lesions on visible extremities. Extremities: No edema. Neurological: Alert oriented x 4, no focal deficits.  Psychological:  Alert and cooperative. Normal mood and affect.  ASSESSMENT AND PLAN:  24.  50 year old female with internal and external hemorrhoids with rectal bleeding. -Colonoscopy benefits and risks discussed including risk with sedation, risk of bleeding, perforation and infection  -CBC -Hydrocortisone 25 mg suppository 1 PR nightly for 5 nights -Apply a small amount of Desitin inside the anal opening and to the external anal area tid as needed for anal or hemorrhoidal irritation/bleeding.  -Further recommendations to be determined after the above evaluation completed -Patient will call our office if her symptoms worsen  2.  History of meningioma s/p craniotomy with resection 06/2019 -Schedule follow-up with neurosurgeon  3.  History of GERD -Omeprazole 40 mg daily as needed  4. DM II  Addendum: Case was forwarded to Osvaldo Angst to review on  05/29/2021. Osvaldo Angst, CRNA  Noralyn Pick, NP; Mansouraty, Telford Nab., MD Jaclyn Shaggy,   This pt is cleared for anesthetic care at [facility].          CC:  [place], Physiological scientist*

## 2021-05-29 NOTE — Patient Instructions (Addendum)
If you are age 51 or younger, your body mass index should be between 19-25. Your Body mass index is 41.33 kg/m. If this is out of the aformentioned range listed, please consider follow up with your Primary Care Provider.   The [facility] providers would like to encourage you to use [facility] to communicate with providers for non-urgent requests or questions.  Due to long hold times on the telephone, sending your provider a message by [facility] may be faster and more efficient way to get a response. Please allow 48 business hours for a response.  Please remember that this is for non-urgent requests/questions.  PROCEDURES: You have been scheduled for a colonoscopy. Please follow the written instructions given to you at your visit today. Please pick up your prep supplies at the pharmacy within the next 1-3 days.  If you use inhalers (even only as needed), please bring them with you on the day of your procedure.  RECOMMENDATIONS: We refill the suppositories, use one a night for 5 nights. Desitin: Apply a small amount to the external anal area three times a day as needed. Miralax- Dissolve one capful in 8 ounces of water and drink before bed.  It was great seeing you today! Thank you for entrusting me with your care and choosing [facility].  Noralyn Pick, CRNP

## 2021-06-01 NOTE — Progress Notes (Signed)
Attending Physician's Attestation   I have reviewed the chart.   I agree with the Advanced Practitioner's note, impression, and recommendations with any updates as below. As long as patient is a candidate for outpatient [place] anesthesia, will proceed with planned/time colonoscopy.  Otherwise will need to be scheduled in the hospital.  Appreciate anesthesia team evaluating case.   Justice Britain, MD [facility] Advanced Endoscopy Office # [phone]

## 2021-06-01 NOTE — Progress Notes (Signed)
Dr. Rush Landmark, Osvaldo Angst reviewed Quillian Quince' history and consult note on 05/29/2021 and he cleared patient for procedure at [facility].

## 2021-06-02 ENCOUNTER — Emergency Department (HOSPITAL_COMMUNITY): Payer: 59

## 2021-06-02 ENCOUNTER — Emergency Department (HOSPITAL_COMMUNITY): Admission: EM | Admit: 2021-06-02 | Discharge: 2021-06-02 | Discharge status: Against Medical Advice | Payer: 59

## 2021-06-02 ENCOUNTER — Other Ambulatory Visit: Payer: Self-pay

## 2021-06-02 ENCOUNTER — Emergency Department (HOSPITAL_COMMUNITY)
Admission: EM | Admit: 2021-06-02 | Discharge: 2021-06-02 | Disposition: A | Discharge status: Home / Self Care | Payer: 59 | Attending: Emergency Medicine | Admitting: Emergency Medicine

## 2021-06-02 ENCOUNTER — Ambulatory Visit (HOSPITAL_COMMUNITY)
Admission: EM | Admit: 2021-06-02 | Discharge: 2021-06-02 | Disposition: A | Discharge status: Home / Self Care | Payer: 59

## 2021-06-02 ENCOUNTER — Encounter (HOSPITAL_COMMUNITY): Payer: Self-pay

## 2021-06-02 DIAGNOSIS — E119 Type 2 diabetes mellitus without complications: Secondary | ICD-10-CM | POA: Diagnosis not present

## 2021-06-02 DIAGNOSIS — R079 Chest pain, unspecified: Secondary | ICD-10-CM | POA: Diagnosis present

## 2021-06-02 DIAGNOSIS — Z7982 Long term (current) use of aspirin: Secondary | ICD-10-CM | POA: Diagnosis not present

## 2021-06-02 DIAGNOSIS — Z7984 Long term (current) use of oral hypoglycemic drugs: Secondary | ICD-10-CM | POA: Diagnosis not present

## 2021-06-02 DIAGNOSIS — Z79899 Other long term (current) drug therapy: Secondary | ICD-10-CM | POA: Insufficient documentation

## 2021-06-02 DIAGNOSIS — R0789 Other chest pain: Secondary | ICD-10-CM

## 2021-06-02 DIAGNOSIS — R110 Nausea: Secondary | ICD-10-CM | POA: Diagnosis not present

## 2021-06-02 DIAGNOSIS — I10 Essential (primary) hypertension: Secondary | ICD-10-CM | POA: Diagnosis not present

## 2021-06-02 DIAGNOSIS — R0602 Shortness of breath: Secondary | ICD-10-CM | POA: Insufficient documentation

## 2021-06-02 LAB — CBC
HCT: 43.5 % (ref 36.0–46.0)
Hemoglobin: 14.1 g/dL (ref 12.0–15.0)
MCH: 28.1 pg (ref 26.0–34.0)
MCHC: 32.4 g/dL (ref 30.0–36.0)
MCV: 86.7 fL (ref 80.0–100.0)
Platelets: 227 K/uL (ref 150–400)
RBC: 5.02 MIL/uL (ref 3.87–5.11)
RDW: 13.6 % (ref 11.5–15.5)
WBC: 6.3 K/uL (ref 4.0–10.5)
nRBC: 0 % (ref 0.0–0.2)

## 2021-06-02 LAB — TROPONIN I (HIGH SENSITIVITY): Troponin I (High Sensitivity): 2 ng/L

## 2021-06-02 LAB — HEPATIC FUNCTION PANEL
ALT: 19 U/L (ref 0–44)
AST: 19 U/L (ref 15–41)
Albumin: 3.7 g/dL (ref 3.5–5.0)
Alkaline Phosphatase: 55 U/L (ref 38–126)
Bilirubin, Direct: 0.2 mg/dL (ref 0.0–0.2)
Indirect Bilirubin: 1.1 mg/dL — ABNORMAL HIGH (ref 0.3–0.9)
Total Bilirubin: 1.3 mg/dL — ABNORMAL HIGH (ref 0.3–1.2)
Total Protein: 7 g/dL (ref 6.5–8.1)

## 2021-06-02 LAB — BASIC METABOLIC PANEL
Anion gap: 6 (ref 5–15)
BUN: 11 mg/dL (ref 6–20)
CO2: 30 mmol/L (ref 22–32)
Calcium: 9.2 mg/dL (ref 8.9–10.3)
Chloride: 107 mmol/L (ref 98–111)
Creatinine, Ser: 0.86 mg/dL (ref 0.44–1.00)
GFR, Estimated: >60 mL/min (ref >60)
Glucose, Bld: 124 mg/dL — ABNORMAL HIGH (ref 70–99)
Potassium: 3.1 mmol/L — ABNORMAL LOW (ref 3.5–5.1)
Sodium: 143 mmol/L (ref 135–145)

## 2021-06-02 LAB — LIPASE, BLOOD: Lipase: 26 U/L (ref 11–51)

## 2021-06-02 MED ORDER — KETOROLAC TROMETHAMINE 60 MG/2ML IM SOLN
30.0000 mg | Freq: Once | INTRAMUSCULAR | Status: AC
  Administered 2021-06-02: 30 mg via INTRAMUSCULAR
  Filled 2021-06-02: qty 2

## 2021-06-02 MED ORDER — NAPROXEN 500 MG PO TABS: 500.0000 mg | ORAL_TABLET | Freq: Two times a day (BID) | ORAL | 0 refills | Status: DC

## 2021-06-02 NOTE — ED Provider Notes (Signed)
Emergency Medicine Provider Triage Evaluation Note  Carolyn Holland , a 50 y.o. female  was evaluated in triage.  Pt complains of chest pain.  Symptoms have been intermittent for the past 2 days.  They typically worsen when standing.  She states that this morning upon waking she stood up and they were significantly worse than normal so she came to the emergency department for evaluation.  She has been taking muscle relaxer as well as OTC medications for indigestion and denies any significant relief.  Reports associated shortness of breath this morning.  States her pain is throbbing in nature and radiating beneath the left breast up through the left side of her chest.  Worsens with palpation as well.  Of note, patient reports a history of hematochezia for the past 2 months and is scheduled for a colonoscopy in 3 days.  Physical Exam  BP (!) 154/97 (BP Location: Left Arm)   Pulse 88   Temp 98 F (36.7 C) (Oral)   Resp 18   Ht 5\' 5"  (1.651 m)   Wt 112.5 kg   SpO2 98%   BMI 41.27 kg/m  Gen:   Awake, no distress   Resp:  Normal effort  MSK:   Moves extremities without difficulty  Other:  Moderate tenderness noted along the left anterior inferior chest wall beneath the left breast.  Additional mild tenderness noted along the upper abdomen.  Medical Decision Making  Medically screening exam initiated at 2:07 PM.  Appropriate orders placed.  Tammatha Cobb was informed that the remainder of the evaluation will be completed by another provider, this initial triage assessment does not replace that evaluation, and the importance of remaining in the ED until their evaluation is complete.   Rayna Sexton, PA-C 06/02/21 1409    Lennice Sites, DO 06/02/21 1533

## 2021-06-02 NOTE — ED Notes (Signed)
Pt left ED.

## 2021-06-02 NOTE — Discharge Instructions (Addendum)
The test today were reassuring.  No signs of heart problems or pneumonia.  Take the medications to help with the pain and discomfort.  Follow-up with your primary care doctor to be rechecked.  Return as needed for worsening symptoms

## 2021-06-02 NOTE — ED Provider Notes (Signed)
[facility] DEPT Provider Note   CSN: [id number] Arrival date & time: 06/02/21  1345     History Chief Complaint  Patient presents with   Chest Pain   Shortness of Breath    Carolyn Holland is a 50 y.o. female.   Chest Pain Associated symptoms: shortness of breath   Shortness of Breath Associated symptoms: chest pain    HPI: A 50 year old patient with a history of treated diabetes, hypertension, hypercholesterolemia and obesity presents for evaluation of chest pain. Initial onset of pain was more than 6 hours ago. The patient's chest pain is sharp and is not worse with exertion. The patient complains of nausea. The patient's chest pain is middle- or left-sided, is not well-localized, is not described as heaviness/pressure/tightness and does not radiate to the arms/jaw/neck. The patient denies diaphoresis. The patient has a family history of coronary artery disease in a first-degree relative with onset less than age 36. The patient has no history of stroke, has no history of peripheral artery disease and has not smoked in the past 90 days.  Patient states the pain is very sharp underneath her left breast.  Patient thought it could be gas related but she tried taking gas medications and it did not help.  She also thought it could be muscle related so she took a muscle relaxant but the pain has persisted.  It has been constant since Sunday and has not resolved. Past Medical History:  Diagnosis Date   Anemia 12/22/2012   Anxiety 2012   Arthritis    RA   Bilateral anterior knee pain 2012   Brain tumor ([facility])    Diabetes mellitus without complication ([facility])    GERD (gastroesophageal reflux disease) 2000   come and go   Headache    As a child   Hyperlipidemia    Hypertension 2008   Panic attack 2012   situational related to move from [place].     Patient Active Problem List   Diagnosis Date Noted   Myalgia 01/29/2020   Positive ANA (antinuclear  antibody) 08/22/2019   Atypical squamous cells of undetermined significance (ASCUS) on Papanicolaou smear of cervix 08/22/2019   Cervical high risk HPV (human papillomavirus) test positive 08/22/2019   History of transient ischemic attack (TIA) 08/22/2019   Status post craniotomy 07/02/2019   Meningioma ([facility]) 07/02/2019   Palpitations 12/20/2017   Essential hypertension 12/20/2017   Hypercholesterolemia 12/20/2017   Varicose veins of both lower extremities with pain 12/20/2017   GAD (generalized anxiety disorder) 02/27/2017   Medication management 02/11/2017   Elevated LDL cholesterol level 02/11/2017   Facial twitching    Chest pain    TIA (transient ischemic attack) 11/07/2014   Hot flashes 12/14/2013   Venous insufficiency 12/11/2013   Gastro-esophageal reflux disease without esophagitis 10/04/2013   Obesity 08/23/2013   Atypical chest pain 06/15/2013   Obstructive sleep apnea 03/02/2013   Right hip pain 01/29/2013   Diabetes mellitus, type 2 ([facility]) 12/29/2012   Vitamin D deficiency 12/22/2012   Anxiety    Benign hypertension    Bilateral anterior knee pain     Past Surgical History:  Procedure Laterality Date   APPLICATION OF CRANIAL NAVIGATION N/A 07/02/2019   Procedure: APPLICATION OF CRANIAL NAVIGATION;  Surgeon: Judith Part, MD;  Location: [facility];  Service: Neurosurgery;  Laterality: N/A;   CESAREAN SECTION  11/22/2000   MENISCUS REPAIR Left 11/22/2009   L knee for bilateral meniscal tear. Following acute injury.  PARTIAL HYSTERECTOMY  11/23/2007   RETROSIGMOID CRANIECTOMY FOR TUMOR RESECTION Left 07/02/2019   Procedure: Left Retrosigmoid craniotomy for tumor resection;  Surgeon: Judith Part, MD;  Location: [facility];  Service: Neurosurgery;  Laterality: Left;     OB History     Gravida  4   Para  3   Term  3   Preterm      AB  1   Living  3      SAB      IAB  1   Ectopic      Multiple      Live Births              Family  History  Problem Relation Age of Onset   Diabetes Mother    Hypertension Mother    Heart disease Mother 70   Stroke Mother 68       x 2   Alcohol abuse Mother        previous    Drug abuse Mother        previous cocaine    Heart attack Mother    Hypertension Father     Social History   Tobacco Use   Smoking status: Never   Smokeless tobacco: Never  Vaping Use   Vaping Use: Never used  Substance Use Topics   Alcohol use: Yes    Comment: occassional, social, wine   Drug use: No    Home Medications Prior to Admission medications   Medication Sig Start Date End Date Taking? Authorizing Provider  naproxen (NAPROSYN) 500 MG tablet Take 1 tablet (500 mg total) by mouth 2 (two) times daily with a meal. As needed for pain 06/02/21  Yes Dorie Rank, MD  ALPRAZolam Duanne Moron) 0.5 MG tablet alprazolam 0.5 mg tablet  TAKE 1 TABLET BY MOUTH THREE TIMES DAILY AS NEEDED FOR ANXIETY Patient not taking: Reported on 05/29/2021 10/13/20   [provider]  aspirin EC 81 MG tablet Take 81 mg by mouth daily. Swallow whole.    [provider]  chlorthalidone (HYGROTON) 25 MG tablet Take 25 mg by mouth daily at 2 PM.     [provider]  diclofenac sodium (VOLTAREN) 1 % GEL Apply 1-2 g topically 4 (four) times daily as needed for pain. 05/12/19   [provider]  furosemide (LASIX) 20 MG tablet Take 20 mg by mouth daily. Patient not taking: Reported on 05/29/2021 05/27/21   [provider]  gabapentin (NEURONTIN) 300 MG capsule Take 300 mg by mouth 3 (three) times daily. Patient not taking: Reported on 05/29/2021 09/22/20   [provider]  glimepiride (AMARYL) 2 MG tablet Take 2 mg by mouth 2 (two) times daily as needed (elevated sugar level).  07/17/20   [provider]  hydrocortisone (ANUCORT-HC) 25 MG suppository Place 1 suppository (25 mg total) rectally at bedtime for 5 days. 05/29/21 06/03/21  Noralyn Pick, NP  JARDIANCE 25 MG TABS  tablet Take 25 mg by mouth daily. 10/02/20   [provider]  losartan (COZAAR) 50 MG tablet Take 50 mg by mouth daily. Patient not taking: Reported on 05/29/2021 05/27/21   [provider]  metFORMIN (GLUCOPHAGE-XR) 500 MG 24 hr tablet Take 500 mg by mouth daily. 02/21/19   [provider]  omeprazole (PRILOSEC) 40 MG capsule Take 40 mg by mouth daily as needed (acid reflux/indigestion.).    [provider]  potassium chloride SA (KLOR-CON) 20 MEQ tablet Take 20 mEq by  mouth 2 (two) times daily. 07/30/20   [provider]  rosuvastatin (CRESTOR) 20 MG tablet Take 20 mg by mouth daily.     [provider]  SUPREP BOWEL PREP KIT 17.5-3.13-1.6 GM/177ML SOLN Take 1 kit by mouth as directed. For colonoscopy prep 05/29/21   Noralyn Pick, NP  TRULICITY 2.26 JF/3.5KT SOPN Inject 0.75 mg into the skin once a week. Thursday 08/27/20   [provider]    Allergies    Penicillins  Review of Systems   Review of Systems  Respiratory:  Positive for shortness of breath.   Cardiovascular:  Positive for chest pain.  All other systems reviewed and are negative.  Physical Exam Updated Vital Signs BP (!) 152/99   Pulse 73   Temp 98 F (36.7 C) (Oral)   Resp (!) 21   Ht 1.651 m (5' 5")   Wt 112.5 kg   SpO2 99%   BMI 41.27 kg/m   Physical Exam Vitals and nursing note reviewed.  Constitutional:      General: She is not in acute distress.    Appearance: She is well-developed.  HENT:     Head: Normocephalic and atraumatic.     Right Ear: External ear normal.     Left Ear: External ear normal.  Eyes:     General: No scleral icterus.       Right eye: No discharge.        Left eye: No discharge.     Conjunctiva/sclera: Conjunctivae normal.  Neck:     Trachea: No tracheal deviation.  Cardiovascular:     Rate and Rhythm: Normal rate and regular rhythm.  Pulmonary:     Effort: Pulmonary effort is normal. No respiratory distress.      Breath sounds: Normal breath sounds. No stridor. No wheezing or rales.  Chest:     Chest wall: Tenderness present.     Comments: Tenderness palpation left chest wall, no rash no crepitus Abdominal:     General: Bowel sounds are normal. There is no distension.     Palpations: Abdomen is soft.     Tenderness: There is no abdominal tenderness. There is no guarding or rebound.  Musculoskeletal:        General: No tenderness or deformity.     Cervical back: Neck supple.  Skin:    General: Skin is warm and dry.     Findings: No rash.  Neurological:     General: No focal deficit present.     Mental Status: She is alert.     Cranial Nerves: No cranial nerve deficit (no facial droop, extraocular movements intact, no slurred speech).     Sensory: No sensory deficit.     Motor: No abnormal muscle tone or seizure activity.     Coordination: Coordination normal.  Psychiatric:        Mood and Affect: Mood normal.    ED Results / Procedures / Treatments   Labs (all labs ordered are listed, but only abnormal results are displayed) Labs Reviewed  BASIC METABOLIC PANEL - Abnormal; Notable for the following components:      Result Value   Potassium 3.1 (*)    Glucose, Bld 124 (*)    All other components within normal limits  HEPATIC FUNCTION PANEL - Abnormal; Notable for the following components:   Total Bilirubin 1.3 (*)    Indirect Bilirubin 1.1 (*)    All other components within normal limits  CBC  LIPASE, BLOOD  TROPONIN  I (HIGH SENSITIVITY)  TROPONIN I (HIGH SENSITIVITY)    EKG EKG Interpretation  Date/Time:  Tuesday June 02 2021 13:58:40 EDT Ventricular Rate:  75 PR Interval:  174 QRS Duration: 88 QT Interval:  380 QTC Calculation: 424 R Axis:   34 Text Interpretation: Normal sinus rhythm Nonspecific T wave abnormality Abnormal ECG No significant change since last tracing Confirmed by Dorie Rank [phone]) on 06/02/2021 5:05:16 PM  Radiology DG Chest 2 View  Result Date:  06/02/2021 CLINICAL DATA:  Chest pain EXAM: CHEST - 2 VIEW COMPARISON:  10/03/2020 FINDINGS: The heart size and mediastinal contours are within normal limits. Both lungs are clear. The visualized skeletal structures are unremarkable. IMPRESSION: Negative. Electronically Signed   By: Rolm Baptise M.D.   On: 06/02/2021 14:21    Procedures Procedures   Medications Ordered in ED Medications  ketorolac (TORADOL) injection 30 mg (has no administration in time range)    ED Course  I have reviewed the triage vital signs and the nursing notes.  Pertinent labs & imaging results that were available during my care of the patient were reviewed by me and considered in my medical decision making (see chart for details).  Clinical Course as of 06/02/21 1728  Tue Jun 02, 2021  1658 Labs reviewed.  Potassium slightly decreased [JK]  1658 Initial troponin normal [JK]  1659 CBC normal and chest x-ray without acute findings [JK]  1711 DG Chest 2 View [JK]    Clinical Course User Index [JK] Dorie Rank, MD   MDM Rules/Calculators/A&P HEAR Score: 4                        Patient presented to the ED with complaints of chest pain.  Symptoms ongoing for several days now.  The pain is constant.  Initial troponin is normal.  EKG does not show any signs of acute cardiac ischemia.  Discussed doing a delta troponin but the patient is anxious to leave as she has to work in 1-1/2 hours.  Symptoms have been constant since _0 /12/22 1727             Dorie Rank, MD 06/02/21 1728

## 2021-06-02 NOTE — ED Notes (Signed)
Attempted to get pt's vitals and to have her sign the MSE waiver, she became very irritable because she hadn't been back for triage. She asked when she was going back and I told her I wasn't sure exactly. She told me if I couldn't tell her when she was going back then she wouldn't allow me to do her vitals.

## 2021-06-02 NOTE — ED Triage Notes (Addendum)
Patient reports that she began having mid chest pain that radiates under the left breast area x 2 days. Patient also c/o slight SOB. Patient states she took Copywriter, advertising and Tums yesterday with no relief. Patient also took Muscle relaxants with no relief. Patient states CP worse when she stands up. Patient c/o blood in her stool x 2 months and is scheduled for a colonoscopy 06/05/21.

## 2021-06-05 ENCOUNTER — Ambulatory Visit (AMBULATORY_SURGERY_CENTER): Payer: 59 | Admitting: Gastroenterology

## 2021-06-05 ENCOUNTER — Encounter: Payer: Self-pay | Admitting: Gastroenterology

## 2021-06-05 ENCOUNTER — Other Ambulatory Visit: Payer: 59

## 2021-06-05 ENCOUNTER — Other Ambulatory Visit: Payer: Self-pay

## 2021-06-05 VITALS — BP 143/89 | HR 88 | Temp 97.5°F | Resp 20 | Ht 65.0 in | Wt 248.0 lb

## 2021-06-05 DIAGNOSIS — K6289 Other specified diseases of anus and rectum: Secondary | ICD-10-CM

## 2021-06-05 DIAGNOSIS — D120 Benign neoplasm of cecum: Secondary | ICD-10-CM

## 2021-06-05 DIAGNOSIS — C188 Malignant neoplasm of overlapping sites of colon: Secondary | ICD-10-CM | POA: Diagnosis not present

## 2021-06-05 DIAGNOSIS — C20 Malignant neoplasm of rectum: Secondary | ICD-10-CM

## 2021-06-05 DIAGNOSIS — D122 Benign neoplasm of ascending colon: Secondary | ICD-10-CM

## 2021-06-05 DIAGNOSIS — D123 Benign neoplasm of transverse colon: Secondary | ICD-10-CM | POA: Diagnosis not present

## 2021-06-05 DIAGNOSIS — K625 Hemorrhage of anus and rectum: Secondary | ICD-10-CM | POA: Diagnosis not present

## 2021-06-05 MED ORDER — SODIUM CHLORIDE 0.9 % IV SOLN: 500.0000 mL | Freq: Once | INTRAVENOUS | Status: DC

## 2021-06-05 NOTE — Progress Notes (Signed)
pt tolerated well. VSS. awake and to recovery. Report given to RN.  

## 2021-06-05 NOTE — Op Note (Signed)
[name] Patient Name: Carolyn Holland Procedure Date: 06/05/2021 11:59 AM MRN: [id number] Endoscopist: Justice Britain , MD Age: 50 Referring MD:  Date of Birth: [birth year] Gender: Female Account #: [id number] Procedure:                Colonoscopy Indications:              Screening for colorectal malignant neoplasm, This                            is the patient's first colonoscopy, Incidental -                            Hematochezia Medicines:                Monitored Anesthesia Care Procedure:                Pre-Anesthesia Assessment:                           - Prior to the procedure, a History and Physical                            was performed, and patient medications and                            allergies were reviewed. The patient's tolerance of                            previous anesthesia was also reviewed. The risks                            and benefits of the procedure and the sedation                            options and risks were discussed with the patient.                            All questions were answered, and informed consent                            was obtained. Prior Anticoagulants: The patient has                            taken no previous anticoagulant or antiplatelet                            agents except for NSAID medication. ASA Grade                            Assessment: III - A patient with severe systemic                            disease. After reviewing the risks and benefits,  the patient was deemed in satisfactory condition to                            undergo the procedure.                           After obtaining informed consent, the colonoscope                            was passed under direct vision. Throughout the                            procedure, the patient's blood pressure, pulse, and                            oxygen saturations were monitored continuously. The                             Olympus CF-HQ190L ([id number]) Colonoscope was                            introduced through the anus and advanced to the 5                            cm into the ileum. The colonoscopy was performed                            without difficulty. The patient tolerated the                            procedure. The quality of the bowel preparation was                            adequate. The terminal ileum, ileocecal valve,                            appendiceal orifice, and rectum were photographed. Scope In: 12:11:51 PM Scope Out: 12:32:05 PM Scope Withdrawal Time: 0 hours 16 minutes 45 seconds  Total Procedure Duration: 0 hours 20 minutes 14 seconds  Findings:                 The digital rectal exam findings include palpable                            rectal mass and hemorrhoids. Pertinent negatives                            include normal sphincter tone.                           The terminal ileum and ileocecal valve appeared                            normal.  Three sessile polyps were found in the transverse                            colon, ascending colon and cecum. The polyps were 2                            to 6 mm in size. These polyps were removed with a                            cold snare. Resection and retrieval were complete.                           A frond-like/villous, fungating, infiltrative and                            ulcerated non-obstructing medium-sized mass was                            found in the proximal rectum. The mass was                            partially circumferential (involving one-third of                            the lumen circumference). The mass measured at                            least three cm in length and 2 cm in width. This is                            noted from 10-12 cm from the anal verge (upon deep                            DRE able to palpate the mass). Evidence of recent                             oozing was present. Biopsies were taken with a cold                            forceps for histology. Area on contralateral wall                            was tattooed with an injection of Spot (carbon                            black).                           Normal mucosa was found in the entire colon                            otherwise.  Non-bleeding non-thrombosed external and internal                            hemorrhoids were found during retroflexion, during                            perianal exam and during digital exam. The                            hemorrhoids were Grade II (internal hemorrhoids                            that prolapse but reduce spontaneously). Complications:            No immediate complications. Estimated Blood Loss:     Estimated blood loss was minimal. Impression:               - Palpable rectal mass and hemorrhoids found on                            digital rectal exam.                           - The examined portion of the ileum was normal.                           - Three 2 to 6 mm polyps in the transverse colon,                            in the ascending colon and in the cecum, removed                            with a cold snare. Resected and retrieved.                           - Rule out malignancy, tumor in the proximal rectum                            (10-12 cm). Biopsied. Tattooed.                           - Normal mucosa in the entire examined colon                            otherwise.                           - Non-bleeding non-thrombosed external and internal                            hemorrhoids. Recommendation:           - The patient will be observed post-procedure,                            until all discharge criteria are met.                           -  Discharge patient to home.                           - Patient has a contact number available for                             emergencies. The signs and symptoms of potential                            delayed complications were discussed with the                            patient. Return to normal activities tomorrow.                            Written discharge instructions were provided to the                            patient.                           - High fiber diet.                           - Use FiberCon 1-2 tablets PO daily.                           - Anusol suppositories can be considered if patient                            desires, but not the clear source of bleeding she                            has been experieincing.                           - Await pathology results.                           - Repeat colonoscopy in 1 year for surveillance.                           - Will proceed with referrals to Colorectal Surgery                            and Oncology and Radiation Oncology once we have                            pathology return.                           - Obtain CEA level (today or within next week).                           - Proceed with scheduling a CT-CAP for staging  purposes within next 1-2 weeks after pathology has                            returned (my team will work on scheduling).                           - In setting of rectal lesion, Pelvic MRI is to be                            obtained as well within next 1-2 weeks after                            pathology has returned (my team will work on                            scheduling).                           - The findings and recommendations were discussed                            with the patient.                           - The findings and recommendations were discussed                            with the patient's family. Justice Britain, MD 06/05/2021 12:46:21 PM

## 2021-06-05 NOTE — Progress Notes (Signed)
Called to room to assist during endoscopic procedure.  Patient ID and intended procedure confirmed with present staff. Received instructions for my participation in the procedure from the performing physician.  

## 2021-06-05 NOTE — Progress Notes (Signed)
VS taken by C.W. 

## 2021-06-05 NOTE — Patient Instructions (Signed)
Impression/Recommendations:  Polyp, hemorrhoid,  and high fiber diet handouts given to patient.  FiberCon 1-2 tablets by mouth daily. Anusol suppositories can be considered if patient desires.  Await pathology results.  Repeat colonoscopy in 1 year for surveillance.  Proceed with referrals to Colorectal surger and oncology and radiation oncology once we have pathology return.  Obtain CEA level.  Proceed with scheduling a CT-CAP for staging purposes within next 1-2 weeks after pathology has returned.  YOU HAD AN ENDOSCOPIC PROCEDURE TODAY AT [facility]:   Refer to the procedure report that was given to you for any specific questions about what was found during the examination.  If the procedure report does not answer your questions, please call your gastroenterologist to clarify.  If you requested that your care partner not be given the details of your procedure findings, then the procedure report has been included in a sealed envelope for you to review at your convenience later.  YOU SHOULD EXPECT: Some feelings of bloating in the abdomen. Passage of more gas than usual.  Walking can help get rid of the air that was put into your GI tract during the procedure and reduce the bloating. If you had a lower endoscopy (such as a colonoscopy or flexible sigmoidoscopy) you may notice spotting of blood in your stool or on the toilet paper. If you underwent a bowel prep for your procedure, you may not have a normal bowel movement for a few days.  Please Note:  You might notice some irritation and congestion in your nose or some drainage.  This is from the oxygen used during your procedure.  There is no need for concern and it should clear up in a day or so.  SYMPTOMS TO REPORT IMMEDIATELY:  Following lower endoscopy (colonoscopy or flexible sigmoidoscopy):  Excessive amounts of blood in the stool  Significant tenderness or worsening of abdominal pains  Swelling of the abdomen that  is new, acute  Fever of 100F or higher For urgent or emergent issues, a gastroenterologist can be reached at any hour by calling [phone]. Do not use MyChart messaging for urgent concerns.    DIET:  We do recommend a small meal at first, but then you may proceed to your regular diet.  Drink plenty of fluids but you should avoid alcoholic beverages for 24 hours.  ACTIVITY:  You should plan to take it easy for the rest of today and you should NOT DRIVE or use heavy machinery until tomorrow (because of the sedation medicines used during the test).    FOLLOW UP: Our staff will call the number listed on your records 48-72 hours following your procedure to check on you and address any questions or concerns that you may have regarding the information given to you following your procedure. If we do not reach you, we will leave a message.  We will attempt to reach you two times.  During this call, we will ask if you have developed any symptoms of COVID 19. If you develop any symptoms (ie: fever, flu-like symptoms, shortness of breath, cough etc.) before then, please call [phone].  If you test positive for Covid 19 in the 2 weeks post procedure, please call and report this information to [place].    If any biopsies were taken you will be contacted by phone or by letter within the next 1-3 weeks.  Please call us at [phone] if you have not heard about the biopsies in 3 weeks.  SIGNATURES/CONFIDENTIALITY: You and/or your care partner have signed paperwork which will be entered into your electronic medical record.  These signatures attest to the fact that that the information above on your After Visit Summary has been reviewed and is understood.  Full responsibility of the confidentiality of this discharge information lies with you and/or your care-partner.

## 2021-06-06 LAB — CEA: CEA: 1.5 ng/mL

## 2021-06-08 ENCOUNTER — Other Ambulatory Visit: Payer: Self-pay

## 2021-06-08 ENCOUNTER — Telehealth: Payer: Self-pay

## 2021-06-08 MED ORDER — LORAZEPAM 1 MG PO TABS: 1.0000 mg | ORAL_TABLET | ORAL | 0 refills | Status: DC

## 2021-06-08 NOTE — Telephone Encounter (Signed)
I have sent a message to the schedulers to set up imaging ASAP.  I have also called in the Ativan to the pt pharmacy.  Will await further results and recommendations.

## 2021-06-08 NOTE — Telephone Encounter (Signed)
-----   Message from Irving Copas., MD sent at 06/06/2021 11:03 AM EDT ----- Regarding: Scheduling scans and referrals Ishani Goldwasser, I performed a colonoscopy on this patient on Friday. 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 She needs staging CT scans (CT chest/abdomen/pelvis) as well as a pelvic MRI to be scheduled. I believe I already placed the orders at the time of the colonoscopy. She will need Ativan 1 mg before each of her tests in an effort of trying to get that completed. If you can set this up as a telephone note and go ahead and send in a prescription for 2 tablets of Ativan 1 mg no refills that would be great. I will be happy to sign it. She knows that the pathology will not be back until Monday or Tuesday but we can go ahead and start getting the CT scheduled. Once I have the tissue diagnosis, we will put in the referrals to oncology/surgery/radiation oncology. Thanks. GM

## 2021-06-09 ENCOUNTER — Telehealth: Payer: Self-pay

## 2021-06-09 ENCOUNTER — Encounter: Payer: Self-pay | Admitting: Gastroenterology

## 2021-06-09 ENCOUNTER — Other Ambulatory Visit: Payer: Self-pay

## 2021-06-09 ENCOUNTER — Telehealth: Payer: Self-pay | Admitting: *Deleted

## 2021-06-09 DIAGNOSIS — C20 Malignant neoplasm of rectum: Secondary | ICD-10-CM

## 2021-06-09 NOTE — Progress Notes (Signed)
We received a new patient referral from Dr Joycie Peek.  I called Ms Diviney and discussed appt options.  She needs an early morning appt.  I have scheduled her with Dr Lorenso Courier on 06/18/2021 at 0900.  I instructed her to arrive at [facility] for registration.  I told her we have free valet service, she may bring one person with her to the appt.  I provided our address and my direct phone number.  She verbalized understanding.

## 2021-06-09 NOTE — Telephone Encounter (Signed)
Message left

## 2021-06-09 NOTE — Telephone Encounter (Signed)
Hi Milagros Middendorf, I am Cheryl Anderson's replacement.  I have Ms Blomgren schedule with Dr Lorenso Courier on 7/28/ at 0900.  She needed an appt in the beginning of the day.  She also mentioned to me she may go to [place].  Have a good day.  KB Ihor Gully RN)

## 2021-06-09 NOTE — Telephone Encounter (Signed)
Left message on follow up call. 

## 2021-06-11 ENCOUNTER — Telehealth: Payer: Self-pay | Admitting: Gastroenterology

## 2021-06-11 NOTE — Telephone Encounter (Signed)
Returned call and office closed will call Monday

## 2021-06-11 NOTE — Telephone Encounter (Signed)
Inbound call from [place] with [facility] calling in regards to referral that was sent over for patient.  Can be reached at [phone].

## 2021-06-12 ENCOUNTER — Other Ambulatory Visit: Payer: Self-pay

## 2021-06-12 ENCOUNTER — Telehealth: Payer: Self-pay | Admitting: Gastroenterology

## 2021-06-12 NOTE — Telephone Encounter (Signed)
Patient calling to inform the insurance company is requesting more detailed information to have the MRI at [facility] 06/16/21 at 7:00am   [facility] provider line [phone].[name]  Pt is requesting to keep this appt.. Plz advise   thanks

## 2021-06-15 NOTE — Telephone Encounter (Signed)
Inbound call from pt requesting a call back to inquire about pre auth. Please advise. Thanks

## 2021-06-15 NOTE — Telephone Encounter (Signed)
Left message on machine to call back  

## 2021-06-15 NOTE — Telephone Encounter (Signed)
Es, see note below on case you worked on. Thanks

## 2021-06-15 NOTE — Telephone Encounter (Signed)
The pt has been advised that [facility] will call her with appt. Phone number provided to call if she has not heard from that office in 1 week.

## 2021-06-16 ENCOUNTER — Ambulatory Visit (HOSPITAL_COMMUNITY): Payer: 59

## 2021-06-16 ENCOUNTER — Ambulatory Visit (HOSPITAL_COMMUNITY)
Admission: RE | Admit: 2021-06-16 | Discharge: 2021-06-16 | Disposition: A | Discharge status: Home / Self Care | Payer: 59 | Source: Ambulatory Visit | Attending: Gastroenterology | Admitting: Gastroenterology

## 2021-06-16 ENCOUNTER — Telehealth: Payer: Self-pay | Admitting: Gastroenterology

## 2021-06-16 ENCOUNTER — Other Ambulatory Visit: Payer: Self-pay

## 2021-06-16 DIAGNOSIS — D122 Benign neoplasm of ascending colon: Secondary | ICD-10-CM | POA: Diagnosis present

## 2021-06-16 DIAGNOSIS — K644 Residual hemorrhoidal skin tags: Secondary | ICD-10-CM

## 2021-06-16 DIAGNOSIS — K648 Other hemorrhoids: Secondary | ICD-10-CM | POA: Diagnosis present

## 2021-06-16 DIAGNOSIS — D123 Benign neoplasm of transverse colon: Secondary | ICD-10-CM

## 2021-06-16 DIAGNOSIS — C20 Malignant neoplasm of rectum: Secondary | ICD-10-CM | POA: Diagnosis present

## 2021-06-16 DIAGNOSIS — D120 Benign neoplasm of cecum: Secondary | ICD-10-CM | POA: Insufficient documentation

## 2021-06-16 DIAGNOSIS — K625 Hemorrhage of anus and rectum: Secondary | ICD-10-CM | POA: Diagnosis present

## 2021-06-16 DIAGNOSIS — K6289 Other specified diseases of anus and rectum: Secondary | ICD-10-CM | POA: Diagnosis present

## 2021-06-16 NOTE — Telephone Encounter (Addendum)
New order has been placed and pt has been r/s for this evening 06/16/21 at 9:00pm- TOA 8:45pm @ [facility]. Pt has been informed and voiced understanding.

## 2021-06-16 NOTE — Telephone Encounter (Signed)
Pt called again, she is very upset because her MRI was cancelled and is requesting a call back from Dr. Rush Landmark. Her insurance told her that we failed to provide documentation for her case.

## 2021-06-16 NOTE — Telephone Encounter (Signed)
Spoke to pt and advised that her MRI of the pelvis was approved, pt states that she was going to call radiology to see if her MRI can be done today.

## 2021-06-18 ENCOUNTER — Inpatient Hospital Stay: Payer: 59

## 2021-06-18 ENCOUNTER — Telehealth: Payer: Self-pay | Admitting: *Deleted

## 2021-06-18 ENCOUNTER — Telehealth: Payer: Self-pay | Admitting: Hematology and Oncology

## 2021-06-18 ENCOUNTER — Inpatient Hospital Stay: Payer: 59 | Admitting: Hematology and Oncology

## 2021-06-18 NOTE — Telephone Encounter (Signed)
Pt has been rescheduled to see Dr. Lorenso Courier on 8/1 at [place]. I cld and lft the appt date and time n her vm. I asked that she call back to confirm her new appt.

## 2021-06-18 NOTE — Telephone Encounter (Signed)
TCT patient as she did not show for her new patient appt with Dr. Lorenso Courier. Spoke with pt and she states she has gotten her appts dates and times mixed up as she she several in the next few days. She states she cannot come this morning. Advised we can re-schedule for 06/22/21 @ 9am.  Pt is agreeable to this. She has an appt at [facility] @ noon the same day for colo-rectal surgeon. Advised that she should have plenty of time to get there after her appt here.  Pt is in agreement.  Dr. Lorenso Courier aware.

## 2021-06-18 NOTE — Progress Notes (Signed)
Rescheduled

## 2021-06-19 ENCOUNTER — Other Ambulatory Visit: Payer: Self-pay

## 2021-06-19 ENCOUNTER — Ambulatory Visit (HOSPITAL_COMMUNITY)
Admission: RE | Admit: 2021-06-19 | Discharge: 2021-06-19 | Disposition: A | Discharge status: Home / Self Care | Payer: 59 | Source: Ambulatory Visit | Attending: Gastroenterology | Admitting: Gastroenterology

## 2021-06-19 DIAGNOSIS — K6289 Other specified diseases of anus and rectum: Secondary | ICD-10-CM | POA: Insufficient documentation

## 2021-06-19 DIAGNOSIS — K625 Hemorrhage of anus and rectum: Secondary | ICD-10-CM | POA: Insufficient documentation

## 2021-06-19 MED ORDER — IOHEXOL 350 MG/ML SOLN
85.0000 mL | Freq: Once | INTRAVENOUS | Status: AC | PRN
  Administered 2021-06-19: 85 mL via INTRAVENOUS

## 2021-06-22 ENCOUNTER — Inpatient Hospital Stay: Payer: 59

## 2021-06-22 ENCOUNTER — Encounter: Payer: Self-pay | Admitting: Hematology and Oncology

## 2021-06-22 ENCOUNTER — Other Ambulatory Visit: Payer: Self-pay | Admitting: Hematology and Oncology

## 2021-06-22 ENCOUNTER — Inpatient Hospital Stay: Payer: 59 | Attending: Hematology and Oncology | Admitting: Hematology and Oncology

## 2021-06-22 ENCOUNTER — Other Ambulatory Visit: Payer: Self-pay

## 2021-06-22 VITALS — BP 143/103 | HR 89 | Temp 98.1°F | Resp 18 | Wt 236.9 lb

## 2021-06-22 DIAGNOSIS — Z801 Family history of malignant neoplasm of trachea, bronchus and lung: Secondary | ICD-10-CM | POA: Insufficient documentation

## 2021-06-22 DIAGNOSIS — I10 Essential (primary) hypertension: Secondary | ICD-10-CM | POA: Diagnosis not present

## 2021-06-22 DIAGNOSIS — Z79899 Other long term (current) drug therapy: Secondary | ICD-10-CM | POA: Insufficient documentation

## 2021-06-22 DIAGNOSIS — C20 Malignant neoplasm of rectum: Secondary | ICD-10-CM

## 2021-06-22 DIAGNOSIS — E785 Hyperlipidemia, unspecified: Secondary | ICD-10-CM | POA: Insufficient documentation

## 2021-06-22 DIAGNOSIS — E119 Type 2 diabetes mellitus without complications: Secondary | ICD-10-CM | POA: Insufficient documentation

## 2021-06-22 DIAGNOSIS — M129 Arthropathy, unspecified: Secondary | ICD-10-CM | POA: Insufficient documentation

## 2021-06-22 DIAGNOSIS — D649 Anemia, unspecified: Secondary | ICD-10-CM | POA: Insufficient documentation

## 2021-06-22 DIAGNOSIS — K219 Gastro-esophageal reflux disease without esophagitis: Secondary | ICD-10-CM | POA: Insufficient documentation

## 2021-06-22 LAB — CMP (CANCER CENTER ONLY)
ALT: 17 U/L (ref 0–44)
AST: 15 U/L (ref 15–41)
Albumin: 4.3 g/dL (ref 3.5–5.0)
Alkaline Phosphatase: 57 U/L (ref 38–126)
Anion gap: 9 (ref 5–15)
BUN: 20 mg/dL (ref 6–20)
CO2: 29 mmol/L (ref 22–32)
Calcium: 10.6 mg/dL — ABNORMAL HIGH (ref 8.9–10.3)
Chloride: 102 mmol/L (ref 98–111)
Creatinine: 0.99 mg/dL (ref 0.44–1.00)
GFR, Estimated: >60 mL/min (ref >60)
Glucose, Bld: 139 mg/dL — ABNORMAL HIGH (ref 70–99)
Potassium: 4 mmol/L (ref 3.5–5.1)
Sodium: 140 mmol/L (ref 135–145)
Total Bilirubin: 1.5 mg/dL — ABNORMAL HIGH (ref 0.3–1.2)
Total Protein: 8.2 g/dL — ABNORMAL HIGH (ref 6.5–8.1)

## 2021-06-22 LAB — CBC WITH DIFFERENTIAL (CANCER CENTER ONLY)
Abs Immature Granulocytes: 0.03 10*3/uL (ref 0.00–0.07)
Basophils Absolute: 0.1 10*3/uL (ref 0.0–0.1)
Basophils Relative: 1 %
Eosinophils Absolute: 0.5 10*3/uL (ref 0.0–0.5)
Eosinophils Relative: 7 %
HCT: 50.9 % — ABNORMAL HIGH (ref 36.0–46.0)
Hemoglobin: 16.8 g/dL — ABNORMAL HIGH (ref 12.0–15.0)
Immature Granulocytes: 0 %
Lymphocytes Relative: 26 %
Lymphs Abs: 1.9 10*3/uL (ref 0.7–4.0)
MCH: 28.1 pg (ref 26.0–34.0)
MCHC: 33 g/dL (ref 30.0–36.0)
MCV: 85.3 fL (ref 80.0–100.0)
Monocytes Absolute: 0.6 10*3/uL (ref 0.1–1.0)
Monocytes Relative: 8 %
Neutro Abs: 4.2 10*3/uL (ref 1.7–7.7)
Neutrophils Relative %: 58 %
Platelet Count: 270 10*3/uL (ref 150–400)
RBC: 5.97 MIL/uL — ABNORMAL HIGH (ref 3.87–5.11)
RDW: 13.1 % (ref 11.5–15.5)
WBC Count: 7.4 10*3/uL (ref 4.0–10.5)
nRBC: 0 % (ref 0.0–0.2)

## 2021-06-22 MED ORDER — ONDANSETRON HCL 8 MG PO TABS: 8.0000 mg | ORAL_TABLET | Freq: Three times a day (TID) | ORAL | 0 refills | Status: AC | PRN

## 2021-06-22 MED ORDER — PROCHLORPERAZINE MALEATE 10 MG PO TABS: 10.0000 mg | ORAL_TABLET | Freq: Four times a day (QID) | ORAL | 0 refills | Status: DC | PRN

## 2021-06-22 MED ORDER — LIDOCAINE-PRILOCAINE 2.5-2.5 % EX CREA: 1.0000 "application " | TOPICAL_CREAM | CUTANEOUS | 0 refills | Status: DC | PRN

## 2021-06-22 NOTE — Progress Notes (Signed)
START ON PATHWAY REGIMEN - Colorectal     A cycle is every 14 days:     Oxaliplatin      Leucovorin      Fluorouracil      Fluorouracil   **Always confirm dose/schedule in your pharmacy ordering system**  Patient Characteristics: Preoperative or Nonsurgical Candidate (Clinical Staging), Rectal, cT3 - cT4, cN0 or Any cT, cN+ Tumor Location: Rectal Therapeutic Status: Preoperative or Nonsurgical Candidate (Clinical Staging) AJCC T Category: cT3 AJCC N Category: cN2 AJCC M Category: cM0 AJCC 8 Stage Grouping: Unknown Intent of Therapy: Curative Intent, Discussed with Patient

## 2021-06-22 NOTE — Progress Notes (Signed)
[place] [phone]   Fax:([phone]) [place] NOTE  Patient Care Team: [facility] as PCP - General (Family Medicine) Debara Pickett, Nadean Corwin, MD as PCP - Cardiology (Cardiology) Mcarthur Rossetti, MD as Consulting Physician (Orthopedic Surgery) Markus Daft, MD as Consulting Physician (Interventional Radiology)  Hematological/Oncological History # Rectal Adenocarcinoma [phone] 06/05/2021: colonoscopy performed, results showed a frond like fungating infiltrative mass in the proximal rectum. Noted to be 10-12 cm from the anal verge. Biopsy consistent with adenocarcinoma. CEA 1.5 (WNL).  06/16/2021: MRI Pelvis WO showed high rectal tumor may extend into the distal sigmoid, best defined this T3 B N2 disease at this time 06/19/2021: CT C/A/P showed short segment of asymmetric wall thickening with luminal narrowing involving the rectosigmoid colon corresponding to patient's colonic neoplasm. Also noted to have prominent mesorectal lymph nodes. No clear evidence of metastatic disease.  06/22/2021: establish care with Dr. Lorenso Courier   CHIEF COMPLAINTS/PURPOSE OF CONSULTATION:  "Rectal Adenocarcinoma "  HISTORY OF PRESENTING ILLNESS:  Carolyn Holland 50 y.o. female with medical history significant for DM type II, GERD, HLD, HTN, and arthritis who presents for evaluation of a newly diagnosed adenocarcinoma of the colon.   On review of the previous records Carolyn Holland underwent colonoscopy on 06/05/2021.  At that time she was found to have a infiltrating mass in the proximal rectum, approximately 10 to 12 cm from the anal verge.  Biopsy was performed which was consistent with adenocarcinoma.  CEA was at 1.5, within normal limits.  She had an MRI performed on 06/16/2021 with a read still pending.  She presents today to establish care for the newly diagnosed rectal adenocarcinoma.  On exam today Carolyn Holland is quite anxious.  She notes that her  symptoms began approximately 2 months ago when she began noticing blood in her stool.  She notes that she does have an external hemorrhoid and thought that that was the source of it.  Eventually she became more bloated and the became harder to pass stools.  She notes that eventually she had such heavy bleeding look like "a menstrual cycle on the toilet paper".  This is what prompted her to seek further evaluation with her PCP who then referred her to GI.  Today Carolyn Holland reports that she is not having bleeding any further.  She notes to stopped approximately 2 days after her colonoscopy.  She is also been taking MiraLAX and Beano every day or try to help with soft stools and bloating.  She reports that she does not smoke cigarettes but does smoke marijuana on a regular basis.  She used to only drink alcohol on the weekends.  She notes that she currently works for [employer] group.  She does have a history of rheumatoid arthritis for which she has not established with rheumatology yet.  She does take Aleve which tends to help with her symptoms.  On further discussion she has family history of lung cancer in a maternal grandfather and maternal aunt.  There is some otherwise heart disease and cholesterol in the family but nothing else remarkable.  She currently denies any fevers, chills, sweats, nausea, vomiting or diarrhea.  A full 10 point ROS is listed below.  MEDICAL HISTORY:  Past Medical History:  Diagnosis Date   Anemia 12/22/2012   Anxiety 2012   Arthritis    RA   Bilateral anterior knee pain 2012   Brain tumor ([place])    Diabetes mellitus without complication ([place])  GERD (gastroesophageal reflux disease) 2000   come and go   Headache    As a child   Hyperlipidemia    Hypertension 2008   Panic attack 2012   situational related to move from [place].     SURGICAL HISTORY: Past Surgical History:  Procedure Laterality Date   APPLICATION OF CRANIAL NAVIGATION N/A 07/02/2019    Procedure: APPLICATION OF CRANIAL NAVIGATION;  Surgeon: Judith Part, MD;  Location: [place];  Service: Neurosurgery;  Laterality: N/A;   CESAREAN SECTION  11/22/2000   MENISCUS REPAIR Left 11/22/2009   L knee for bilateral meniscal tear. Following acute injury.    PARTIAL HYSTERECTOMY  11/23/2007   RETROSIGMOID CRANIECTOMY FOR TUMOR RESECTION Left 07/02/2019   Procedure: Left Retrosigmoid craniotomy for tumor resection;  Surgeon: Judith Part, MD;  Location: [place];  Service: Neurosurgery;  Laterality: Left;    SOCIAL HISTORY: Social History   Socioeconomic History   Marital status: Single    Spouse name: Not on file   Number of children: 3   Years of education: assoc. deg   Highest education level: Not on file  Occupational History   Occupation: Facilities manager: [employer]   Occupation: Charity fundraiser: Theme park manager  Tobacco Use   Smoking status: Never   Smokeless tobacco: Never  Vaping Use   Vaping Use: Never used  Substance and Sexual Activity   Alcohol use: Yes    Comment: occassional, social, wine   Drug use: No   Sexual activity: Not Currently    Partners: Male    Birth control/protection: Condom, Surgical  Other Topics Concern   Not on file  Social History Narrative   Live with two children 16 and 11.       epworth sleepiness scale = 3 (02/27/16)       Social Determinants of Health   Financial Resource Strain: Not on file  Food Insecurity: Not on file  Transportation Needs: Not on file  Physical Activity: Not on file  Stress: Not on file  Social Connections: Not on file  Intimate Partner Violence: Not on file    FAMILY HISTORY: Family History  Problem Relation Age of Onset   Diabetes Mother    Hypertension Mother    Heart disease Mother 101   Stroke Mother 8       x 2   Alcohol abuse Mother        previous    Drug abuse Mother        previous cocaine    Heart attack Mother    Hypertension Father     Colon cancer Neg Hx    Esophageal cancer Neg Hx    Stomach cancer Neg Hx    Rectal cancer Neg Hx     ALLERGIES:  is allergic to penicillins.  MEDICATIONS:  Current Outpatient Medications  Medication Sig Dispense Refill   ALPRAZolam (XANAX) 0.5 MG tablet alprazolam 0.5 mg tablet  TAKE 1 TABLET BY MOUTH THREE TIMES DAILY AS NEEDED FOR ANXIETY     chlorthalidone (HYGROTON) 25 MG tablet Take 25 mg by mouth daily at 2 PM.     Continuous Blood Gluc Sensor (FREESTYLE LIBRE 2 SENSOR) MISC See admin instructions.     glimepiride (AMARYL) 2 MG tablet Take 2 mg by mouth 2 (two) times daily as needed (elevated sugar level).     lidocaine-prilocaine (EMLA) cream Apply 1 application topically as needed. 30 g 0   LORazepam (  ATIVAN) 1 MG tablet Take 1 tablet (1 mg total) by mouth as directed. Take prior to MRI and CT scan 2 tablet 0   ondansetron (ZOFRAN) 8 MG tablet Take 1 tablet (8 mg total) by mouth every 8 (eight) hours as needed. 30 tablet 0   prochlorperazine (COMPAZINE) 10 MG tablet Take 1 tablet (10 mg total) by mouth every 6 (six) hours as needed for nausea or vomiting. 30 tablet 0   aspirin 325 MG tablet 1 tablet     diclofenac sodium (VOLTAREN) 1 % GEL Apply 1-2 g topically 4 (four) times daily as needed for pain.     gabapentin (NEURONTIN) 300 MG capsule Take 300 mg by mouth 3 (three) times daily. (Patient not taking: Reported on 05/29/2021)     JARDIANCE 25 MG TABS tablet Take 25 mg by mouth daily.     metFORMIN (GLUCOPHAGE-XR) 500 MG 24 hr tablet Take 500 mg by mouth daily.     Multiple Vitamins-Minerals (MULTI FOR HER) TABS See admin instructions.     omeprazole (PRILOSEC) 40 MG capsule Take 40 mg by mouth daily as needed (acid reflux/indigestion.).     potassium chloride SA (KLOR-CON) 20 MEQ tablet Take 20 mEq by mouth 2 (two) times daily.     rosuvastatin (CRESTOR) 20 MG tablet Take 20 mg by mouth daily.      TRULICITY [id number] SOPN Inject 0.75 mg into the skin once a week.  Thursday     No current facility-administered medications for this visit.    REVIEW OF SYSTEMS:   Constitutional: ( - ) fevers, ( - )  chills , ( - ) night sweats Eyes: ( - ) blurriness of vision, ( - ) double vision, ( - ) watery eyes Ears, nose, mouth, throat, and face: ( - ) mucositis, ( - ) sore throat Respiratory: ( - ) cough, ( - ) dyspnea, ( - ) wheezes Cardiovascular: ( - ) palpitation, ( - ) chest discomfort, ( - ) lower extremity swelling Gastrointestinal:  ( - ) nausea, ( - ) heartburn, ( - ) change in bowel habits Skin: ( - ) abnormal skin rashes Lymphatics: ( - ) new lymphadenopathy, ( - ) easy bruising Neurological: ( - ) numbness, ( - ) tingling, ( - ) new weaknesses Behavioral/Psych: ( - ) mood change, ( - ) new changes  All other systems were reviewed with the patient and are negative.  PHYSICAL EXAMINATION: ECOG PERFORMANCE STATUS: 1 - Symptomatic but completely ambulatory  Vitals:   06/22/21 0928  BP: (!) 143/103  Pulse: 89  Resp: 18  Temp: 98.1 F (36.7 C)  SpO2: 98%   Filed Weights   06/22/21 0928  Weight: 236 lb 14.4 oz (107.5 kg)    GENERAL: well appearing middle-aged African-American female in NAD  SKIN: skin color, texture, turgor are normal, no rashes or significant lesions EYES: conjunctiva are pink and non-injected, sclera clear LUNGS: clear to auscultation and percussion with normal breathing effort HEART: regular rate & rhythm and no murmurs and no lower extremity edema PSYCH: alert & oriented x 3, fluent speech NEURO: no focal motor/sensory deficits  LABORATORY DATA:  I have reviewed the data as listed CBC Latest Ref Rng & Units 06/22/2021 06/02/2021 05/29/2021  WBC 4.0 - 10.5 K/uL 7.4 6.3 5.7  Hemoglobin 12.0 - 15.0 g/dL 16.8(H) 14.1 14.7  Hematocrit 36.0 - 46.0 % 50.9(H) 43.5 43.1  Platelets 150 - 400 K/uL 270 227 229.0    CMP Latest Ref  Rng & Units 06/22/2021 06/02/2021 10/03/2020  Glucose 70 - 99 mg/dL 139(H) 124(H) 94  BUN 6 - 20 mg/dL  '20 11 15  '$ Creatinine 0.44 - 1.00 mg/dL 0.99 0.86 0.88  Sodium 135 - 145 mmol/L 140 143 140  Potassium 3.5 - 5.1 mmol/L 4.0 3.1(L) 3.6  Chloride 98 - 111 mmol/L 102 107 105  CO2 22 - 32 mmol/L '29 30 25  '$ Calcium 8.9 - 10.3 mg/dL 10.6(H) 9.2 9.3  Total Protein 6.5 - 8.1 g/dL 8.2(H) 7.0 -  Total Bilirubin 0.3 - 1.2 mg/dL 1.5(H) 1.3(H) -  Alkaline Phos 38 - 126 U/L 57 55 -  AST 15 - 41 U/L 15 19 -  ALT 0 - 44 U/L 17 19 -     PATHOLOGY:   RADIOGRAPHIC STUDIES: I have personally reviewed the radiological images as listed and agreed with the findings in the report. DG Chest 2 View  Result Date: 06/02/2021 CLINICAL DATA:  Chest pain EXAM: CHEST - 2 VIEW COMPARISON:  10/03/2020 FINDINGS: The heart size and mediastinal contours are within normal limits. Both lungs are clear. The visualized skeletal structures are unremarkable. IMPRESSION: Negative. Electronically Signed   By: Rolm Baptise M.D.   On: 06/02/2021 14:21   MR PELVIS WO CONTRAST  Result Date: 06/18/2021 CLINICAL DATA:  Rectal cancer, follow-up colonoscopy with diagnosis of rectal mass. EXAM: MRI PELVIS WITHOUT CONTRAST TECHNIQUE: Multiplanar multisequence MR imaging of the pelvis was performed. No intravenous contrast was administered. Ultrasound gel was administered per rectum to optimize tumor evaluation. COMPARISON:  None currently, chest abdomen pelvis CT pending. FINDINGS: TUMOR LOCATION Tumor distance from Anal Verge/Skin Surface:  12 14 cm Tumor distance to Internal Anal Sphincter: 6-9 cm TUMOR DESCRIPTION Circumferential Extent: Nearly circumferential tumor involving the proximal rectum and distal sigmoid beginning just at the level of the anterior peritoneal reflection. In most areas approximately 270 degrees involvement is suggested. Assessment limited by rectal and colonic spasm. Filling defect in the lumen and or thickening of the rectum along the posterior wall seen on image 25 of series 3 may represent extension of the lesion  or adherent stool. Definitive rectal thickening is seen just proximal to this area (image 20/3). Lesion appears polypoid and mainly along the posterior wall and superior wall of the rectosigmoid. Tumor Length: 4-7 cm T - CATEGORY Extension through Muscularis Propria: Suspect T3 B disease along the LEFT lateral rectal wall best seen on image 27 of series 5 approximately 4 mm extension. Shortest Distance of any tumor/node from Mesorectal Fascia: Difficult to quantify as areas are mainly above the APR. The area of potential extension beyond the muscularis appears just above the anterior peritoneal reflection. Extramural Vascular Invasion/Tumor Thrombus: No Invasion of Anterior Peritoneal Reflection: No Involvement of Adjacent Organs or Pelvic Sidewall: No Levator Ani Involvement: No N - CATEGORY Mesorectal Lymph Nodes >=69m: N2 disease. Three lymph nodes above the 5 mm threshold in the mesorectum/superior rectal vein territory (image 30/9) 5 mm lymph node to the RIGHT and superior to the rectum. (Image 32/9) 5 mm lymph node posteriorly oriented in the mesorectum approximately 5 mm from the upper margin of the mesorectal fascia. Two lymph nodes approximately 5 mm on image 26/9) just to the RIGHT and superior to the rectum. Extra-mesorectal Lymphadenopathy: No Other: Urinary bladder unremarkable. No distal ureteral dilation. No suspicious bony lesions on limited pelvic imaging. No signs of bowel dilation. IMPRESSION: High rectal tumor may extend into the distal sigmoid, best defined this T3 B N2 disease at  this time. Much of the tumor is just above the anterior peritoneal reflection but does not appear to involve the anterior peritoneal reflection. Exam mildly limited by rectal spasm. Electronically Signed   By: Zetta Bills M.D.   On: 06/18/2021 11:00   CT CHEST ABDOMEN PELVIS W CONTRAST  Result Date: 06/19/2021 CLINICAL DATA:  Colorectal cancer, staging. EXAM: CT CHEST, ABDOMEN, AND PELVIS WITH CONTRAST  TECHNIQUE: Multidetector CT imaging of the chest, abdomen and pelvis was performed following the standard protocol during bolus administration of intravenous contrast. CONTRAST:  71m OMNIPAQUE IOHEXOL 350 MG/ML SOLN COMPARISON:  MRI pelvis June 16, 2021. FINDINGS: CT CHEST FINDINGS Cardiovascular: No thoracic aortic aneurysm. Normal size heart. Left circumflex coronary artery calcifications. No significant pericardial effusion/thickening. Mediastinum/Nodes: No supraclavicular adenopathy. No suspicious thyroid nodule. No pathologically enlarged mediastinal, hilar or axillary lymph nodes. The trachea and esophagus are unremarkable. Lungs/Pleura: Ground grass 3 mm left upper lobe pulmonary nodule on image 26/4. Solid 4 mm left upper lobe pulmonary nodule on image 40/4. Tiny 3 mm or smaller pleural based pulmonary nodules in the left lower lobe for instance on image 69/4 and 76/4. Solid 3 mm right middle lobe perifissural nodule on image 74/4. There are 2 adjacent solid 2-3 mm peripheral pulmonary nodules in the right middle lobe on images 93/4. No pleural effusion.  No pneumothorax. Musculoskeletal: No aggressive lytic or blastic lesion of bone visualized. CT ABDOMEN PELVIS FINDINGS Hepatobiliary: Diffuse hepatic steatosis. No suspicious hepatic lesion. Gallbladder is unremarkable. No biliary ductal dilation. Pancreas: Within normal limits. Spleen: Within normal limits. Adrenals/Urinary Tract: Bilateral adrenal glands are unremarkable. Bilateral hypodense renal cysts. No solid enhancing renal masses. No hydronephrosis. Urinary bladder is unremarkable for degree of distension. Stomach/Bowel: Radiopaque enteric contrast traverses the hepatic flexure. Tiny hiatal hernia otherwise the stomach is unremarkable. No pathologic dilation of small bowel. Normal appendix. Short segment of asymmetric wall thickening with luminal narrowing involving the rectosigmoid colon measuring on image 97/2 corresponding patient's colonic  neoplasm. Vascular/Lymphatic: No abdominal aortic aneurysm. Prominent mesorectal lymph nodes measuring up to 4 mm on image 93/2, better evaluated on prior MRI. No pathologically enlarged abdominal or pelvic lymph nodes. Reproductive: Uterus and bilateral adnexa are unremarkable. Other: No abdominopelvic ascites. Musculoskeletal: No aggressive lytic or blastic lesion of bone. IMPRESSION: 1. Short segment of asymmetric wall thickening with luminal narrowing involving the rectosigmoid colon corresponding to patient's colonic neoplasm. 2. Prominent mesorectal lymph nodes measuring up to 4 mm, better evaluated on prior MRI. 3. Multiple small bilateral pulmonary nodules measuring up to 4 mm, nonspecific and possibly infectious or inflammatory. Attention on close interval follow-up imaging is suggested. 4. No pathologically enlarged lymph nodes or evidence of osseous metastatic disease in the chest, abdomen or pelvis. 5. No findings to suggest solid organ metastases in the abdomen or pelvis. 6. Diffuse hepatic steatosis. Electronically Signed   By: JDahlia BailiffMD   On: 06/19/2021 15:02    ASSESSMENT & PLAN RDonnell Harsha540y.o. female with medical history significant for DM type II, GERD, HLD, HTN, and arthritis who presents for evaluation of a newly diagnosed adenocarcinoma of the colon.   After review of the labs, review of the records, and discussion with the patient the patients findings are most consistent with rectal adenocarcinoma.  Findings are consistent with localized disease, therefore I would recommend neoadjuvant chemotherapy, followed by neoadjuvant chemoradiation, followed by surgical resection. (Ann Surg Oncol. 2021 Nov;28(12):7476-7486).   # Adenocarcinoma of the Rectum, T3bN2M0 -- Patient has completed staging with  MRI and has biopsy confirmed adenocarcinoma of the rectum. --Patient has a clinic visit today with Dr. Ebony Hail at [facility] --We will refer the patient to radiation  oncology today for consideration of the radiation portion of treatment --My current recommendation would be to start with chemotherapy consisting of FOLFOX x8 cycles followed by chemoradiation with Xeloda then reimaging.  Afterwards would consider resection of the residual tumor. --We will assure that surgery, radiation, and oncology are all on the same page before starting treatment. --We will place orders today for port placement and chemotherapy education. --Return to clinic in approximately 2 to 3 weeks time in order to start treatment.  #Supportive Care -- chemotherapy education to be scheduled  -- port placement to be scheduled.  -- zofran '8mg'$  q8H PRN and compazine '10mg'$  PO q6H for nausea -- EMLA cream for port -- no pain medication required at this time.   Orders Placed This Encounter  Procedures   IR IMAGING GUIDED PORT INSERTION    Standing Status:   Future    Standing Expiration Date:   06/22/2022    Order Specific Question:   Reason for Exam (SYMPTOM  OR DIAGNOSIS REQUIRED)    Answer:   port required for chemotherapy, hx of rectal cancer    Order Specific Question:   Is the patient pregnant?    Answer:   No    Order Specific Question:   Preferred Imaging Location?    Answer:   [facility]   All questions were answered. The patient knows to call the clinic with any problems, questions or concerns.  A total of more than 60 minutes were spent on this encounter with face-to-face time and non-face-to-face time, including preparing to see the patient, ordering tests and/or medications, counseling the patient and coordination of care as outlined above.   Ledell Peoples, MD Department of Hematology/Oncology [place] [facility] Phone: [phone] Pager: [phone] Email: [email or web address]  06/22/2021 1:11 PM

## 2021-06-23 NOTE — Progress Notes (Signed)
I left a vm with Ms Mallek requesting a return call.  I provided my direct contact information.

## 2021-06-25 ENCOUNTER — Ambulatory Visit: Admission: RE | Admit: 2021-06-25 | Payer: 59 | Source: Ambulatory Visit | Admitting: Radiation Oncology

## 2021-06-25 ENCOUNTER — Ambulatory Visit: Payer: 59

## 2021-06-25 NOTE — Progress Notes (Signed)
Carolyn Holland left a vm stating she would not be able to keep her appts this am with Rad/Onc as she has a felxsig at [facility] this am at 0930.  I sent Blenda Nicely a message.

## 2021-06-29 ENCOUNTER — Inpatient Hospital Stay: Payer: 59

## 2021-06-30 ENCOUNTER — Ambulatory Visit: Admission: RE | Admit: 2021-06-30 | Payer: 59 | Source: Ambulatory Visit

## 2021-06-30 ENCOUNTER — Ambulatory Visit: Payer: 59 | Admitting: Radiation Oncology

## 2021-06-30 ENCOUNTER — Other Ambulatory Visit: Payer: Self-pay

## 2021-07-03 ENCOUNTER — Encounter: Payer: Self-pay | Admitting: Hematology and Oncology

## 2021-07-13 ENCOUNTER — Ambulatory Visit: Payer: 59

## 2021-07-13 ENCOUNTER — Other Ambulatory Visit: Payer: 59

## 2021-07-13 ENCOUNTER — Ambulatory Visit: Payer: 59 | Admitting: Hematology and Oncology

## 2021-07-17 ENCOUNTER — Other Ambulatory Visit: Payer: Self-pay | Admitting: Obstetrics & Gynecology

## 2021-07-17 DIAGNOSIS — N6320 Unspecified lump in the left breast, unspecified quadrant: Secondary | ICD-10-CM

## 2021-07-20 ENCOUNTER — Ambulatory Visit
Admission: RE | Admit: 2021-07-20 | Discharge: 2021-07-20 | Disposition: A | Discharge status: Home / Self Care | Payer: 59 | Source: Ambulatory Visit | Attending: Obstetrics & Gynecology | Admitting: Obstetrics & Gynecology

## 2021-07-20 ENCOUNTER — Other Ambulatory Visit: Payer: 59

## 2021-07-20 ENCOUNTER — Other Ambulatory Visit: Payer: Self-pay

## 2021-07-20 ENCOUNTER — Encounter: Payer: Self-pay | Admitting: Hematology and Oncology

## 2021-07-20 DIAGNOSIS — N6320 Unspecified lump in the left breast, unspecified quadrant: Secondary | ICD-10-CM

## 2021-07-29 ENCOUNTER — Other Ambulatory Visit: Payer: 59

## 2021-07-29 ENCOUNTER — Ambulatory Visit: Payer: 59 | Admitting: Hematology and Oncology

## 2021-07-29 ENCOUNTER — Ambulatory Visit: Payer: 59

## 2021-08-04 ENCOUNTER — Telehealth: Payer: Self-pay | Admitting: Hematology and Oncology

## 2021-08-04 NOTE — Telephone Encounter (Signed)
Scheduled per sch msg. Called and left msg  

## 2021-08-04 NOTE — Progress Notes (Signed)
I left vm for Carolyn Holland letting her know DrDorsey would like to schedule a follow-up appt and our schedulers will be reaching out to her.  I asked her to return my call and provided my direct contact number.

## 2021-08-16 ENCOUNTER — Other Ambulatory Visit: Payer: Self-pay | Admitting: Hematology and Oncology

## 2021-08-16 DIAGNOSIS — C20 Malignant neoplasm of rectum: Secondary | ICD-10-CM

## 2021-08-17 ENCOUNTER — Telehealth: Payer: Self-pay | Admitting: *Deleted

## 2021-08-17 ENCOUNTER — Inpatient Hospital Stay: Payer: 59

## 2021-08-17 ENCOUNTER — Inpatient Hospital Stay: Payer: 59 | Attending: Hematology and Oncology | Admitting: Hematology and Oncology

## 2021-08-17 NOTE — Telephone Encounter (Signed)
TCT patient regarding her appt for today with Dr. Lorenso Courier. She has no shown for this appt yet. No answer but was able to leave vm message for her to return call regarding her appt for today.

## 2021-09-03 ENCOUNTER — Other Ambulatory Visit: Payer: Self-pay | Admitting: Neurological Surgery

## 2021-09-03 DIAGNOSIS — D320 Benign neoplasm of cerebral meninges: Secondary | ICD-10-CM

## 2021-09-09 ENCOUNTER — Other Ambulatory Visit (HOSPITAL_COMMUNITY): Payer: Self-pay | Admitting: Neurological Surgery

## 2021-09-09 ENCOUNTER — Encounter (HOSPITAL_COMMUNITY): Payer: Self-pay

## 2021-09-09 ENCOUNTER — Other Ambulatory Visit: Payer: Self-pay | Admitting: Neurological Surgery

## 2021-09-09 ENCOUNTER — Ambulatory Visit (HOSPITAL_COMMUNITY): Payer: 59

## 2021-09-09 DIAGNOSIS — D320 Benign neoplasm of cerebral meninges: Secondary | ICD-10-CM

## 2021-09-10 ENCOUNTER — Other Ambulatory Visit (HOSPITAL_COMMUNITY): Payer: 59

## 2021-09-10 ENCOUNTER — Other Ambulatory Visit: Payer: Self-pay | Admitting: Neurological Surgery

## 2021-09-10 DIAGNOSIS — D320 Benign neoplasm of cerebral meninges: Secondary | ICD-10-CM

## 2021-09-11 ENCOUNTER — Other Ambulatory Visit: Payer: Self-pay

## 2021-09-11 ENCOUNTER — Ambulatory Visit (HOSPITAL_COMMUNITY)
Admission: RE | Admit: 2021-09-11 | Discharge: 2021-09-11 | Disposition: A | Discharge status: Home / Self Care | Payer: 59 | Source: Ambulatory Visit | Attending: Neurological Surgery | Admitting: Neurological Surgery

## 2021-09-11 DIAGNOSIS — D320 Benign neoplasm of cerebral meninges: Secondary | ICD-10-CM | POA: Diagnosis present

## 2021-09-11 MED ORDER — GADOBUTROL 1 MMOL/ML IV SOLN
10.0000 mL | Freq: Once | INTRAVENOUS | Status: AC | PRN
  Administered 2021-09-11: 10 mL via INTRAVENOUS

## 2021-09-14 ENCOUNTER — Other Ambulatory Visit: Payer: Self-pay | Admitting: Radiation Therapy

## 2021-09-25 ENCOUNTER — Other Ambulatory Visit: Payer: Self-pay | Admitting: Radiation Therapy

## 2021-09-25 DIAGNOSIS — D320 Benign neoplasm of cerebral meninges: Secondary | ICD-10-CM

## 2021-09-25 NOTE — Progress Notes (Signed)
Location/Histology of Brain Tumor:  Meningioma  Patient presented with symptoms of:   MRI Brain w/ & w/o Contrast 09/11/2021 --IMPRESSION: Slight further enlargement of a meningioma along the anterior tentorium on the left, maximal measurements today 7 x 19 mm compared with 6 x 17 mm previously. Slight mass-effect upon the left side of the pons. Extension into the Meckel's cave region on the left. If further intervention is considered, or on further routine follow-up, consider utilization of temporal bone MRI for higher resolution evaluation of this area  Past or anticipated interventions, if any, per neurosurgery:  09/11/2021 --Dr. Emelda Brothers (office visit) Carolyn Holland is here for follow up. In the interim, she has continued to have some trigeminal distribution paresthesias in the face, which are understandably anxiety-provoking. No new numbness or TDP, no new diplopia or other complaints. She had her follow up MRI performed and is here to discuss the results. Of note, in the interim, she was unfortunately diagnosed with colon cancer, had what sounds like a low anterior resection with negative nodes and no planned further treatment except very close follow up  07/02/2019 --Dr. Emelda Brothers  Left retrosigmoid craniotomy for resection of tumor,  Use of frameless stereotaxy and electrophysiologic facial nerve monitoring  Past or anticipated interventions, if any, per medical oncology:  (Below providers are for patient's rectal cancer; she has not been assigned a medical oncologist for meningioma)   Dr. Pershing Cox ([facility]) 08/31/2021 Rectosigmoid cancer (CMS-HCC)  who is seen in consultation today at the request of Dr Ebony Hail. I discussed and reviewed prior imaging studies (MRI/CT abd), pathology findings post surgery, staging as T3NoMx likelyto be stage 2 disease. CT CAP for completion of staging. Unlikely to have chest disease but would like completion of staging for future  surveillance. Signatera testing as most of the tumor is above peritoneal reflection and akin to rectosigmoid tumor. CBC CMP and CEA for monitoring.  Given stage 2, does not meet criteria for high risk disease and adjuvant chemotherapy. If CT DNA positive, could consider enrollment into clinical trial.  Plan to consider US doppler on leg if cardiology evaluation not complete in next 5 weeks or smptoms worsen.  RTC in 5 weeks. CT CAP prior  Dr. Narda Rutherford  06/22/2021 Adenocarcinoma of the Rectum, T3bN2M0 Patient has completed staging with MRI and has biopsy confirmed adenocarcinoma of the rectum. Patient has a clinic visit today with Dr. Ebony Hail at [facility] We will refer the patient to radiation oncology today for consideration of the radiation portion of treatment My current recommendation would be to start with chemotherapy consisting of FOLFOX x8 cycles followed by chemoradiation with Xeloda then reimaging.  Afterwards would consider resection of the residual tumor. We will assure that surgery, radiation, and oncology are all on the same page before starting treatment. We will place orders today for port placement and chemotherapy education. Return to clinic in approximately 2 to 3 weeks time in order to start treatment.  Dose of Decadron, if applicable: Not currently prescribed   Recent neurologic symptoms, if any:  Seizures: Patient denies Headaches: Since her surgery in 06/2019, she reports a tingling/throbbing to the left side of her head. States it typically lasts for 3 days (despite taking naproxen, acetaminophen, or gabapentin). Indicates throbbing above left eye/temporal area, and tingling that radiates outward. Does report history of migraines  Nausea: Denies but does report a decrease in appetite Dizziness/ataxia: Patient denies Difficulty with hand coordination: Patient denies Focal numbness/weakness: Patient denies Visual deficits/changes:  Reports occasional  blurry vision or difficulty focusing (feels that is more related to her diabetes, when her blood sugar is elevated) Confusion/Memory deficits: Patient denies   SAFETY ISSUES: Prior radiation? No Pacemaker/ICD? No Possible current pregnancy? No--hysterectomy Is the patient on methotrexate? No  Additional Complaints / other details:

## 2021-09-27 NOTE — Progress Notes (Signed)
Radiation Oncology         [phone]) [phone] ________________________________  Initial Outpatient Consultation  Name: Carolyn Holland MRN: [id number]  Date: 09/28/2021  DOB: 12/02/1970  VQ:[id number], [facility]  Judith Part, MD   REFERRING PHYSICIAN: Judith Part, MD  DIAGNOSIS:     ICD-10-CM   1. Benign neoplasm of cerebral meninges (HCC)  D32.0 LORazepam (ATIVAN) 1 MG tablet    2. Meningioma ([place])  D32.9        History of a meningioma s/p craniotomy with resection in August of 2020; recent increase in size of meningoma on recent MRI  Recent diagnosis of rectal cancer; rectosigmoid adenocarcinoma  HISTORY OF PRESENT ILLNESS::Carolyn Holland is a 50 y.o. female who presents today for consideration of radiotherapy in treatment of her diagnosed meningioma. (Patient was also recently diagnosed with rectosigmoid adenocarcinoma (detailed below)).   Meningioma:  The patient initially presented to the ED for admission on 05/22/19 with gradual, persistent, waxing and waning left sided facial tingling beggining that evening.  Patient reported that this was initially accompanied by cramping of the jaw and a headache, but these associated symptoms since resolved. She reported that the tingling sensations had occurred intermittently since February of 2020. CT of the head on 05/23/19 revealed no acute intracranial abnormality. MRI of the brain and cervical spine on 05/23/19 however revealed a 21 x 11 x 17 mm extra-axial mass centered at the left CP angle with pontine mass effect and extension into the left porous acousticus and Meckel's cave. The appearance of the mass was noted as most compatible with meningioma.  The patient then underwent tumor resection on 07/02/2019. Pathology from procedure revealed WHO grade 1 meningioma.  Post-procedural MRI of the brain on 95/07/3266 showed no complicating feature post left CP angle mass resection. A 4 mm nodular component was  seen to persist at the internal auditory canal, as well as mild thickening of the anterior left tentorial leaflet.   MRI of the brain on 02/11/20 showed residual meningioma along the left anterior tentorium as stable, measuring 5 x 10 mm.  MRI of the brain on 10/03/20 showed a slight increase in size of the residual meningioma since prior MRI, measuring 6 x 17 mm. Enhancing tumor was again seen to extend anteriorly toward Meckel's cave and possibly into the cavernous sinus region, as well as into the medial aspect of the left middle cranial fossa.  Follow-up MRI of the brain on 09/11/21 for known meningioma demonstrated slight enlargement of a meningioma along the anterior tentorium on the left, measuring 7 x 19 mm, previously measuring 6 x 17 mm. Extension was again seen into the Meckel's cave region on the left.   The patient followed up with Dr. Zada Finders on 09/11/21. In the interval since her last visit with him, the patient reported continued trigeminal distribution paresthesias in the face that come and go, which causes her some anxiety. Patient reported no new numbness, TDP, or diplopia.   Seizures: Patient denies Headaches: Since her surgery in 06/2019, she reports a tingling/throbbing to the left side of her head. States it typically lasts for 3 days (despite taking naproxen, acetaminophen, or gabapentin). Indicates throbbing above left eye/temporal area, and tingling that radiates outward. Does report history of migraines  Nausea: Denies but does report a decrease in appetite Dizziness/ataxia: Patient denies Difficulty with hand coordination: Patient denies Focal numbness/weakness: Patient denies Visual deficits/changes: Reports occasional blurry vision or difficulty focusing (feels that is more related to  her diabetes, when her blood sugar is elevated) Confusion/Memory deficits: Patient denies   Rectal Cancer: The patient underwent colonoscopy on 06/05/2021 and was found to have a  infiltrating mass in the proximal rectum, approximately 10 to 12 cm from the anal verge.  Biopsy of the rectosigmoid junction mass performed on 06/05/21 revealed findings consistent with moderately differentiated invasive adenocarcinoma. Biopsy of the transverse colon polyp revealed tubular adenoma.    MRI performed on 06/16/2021 revealed the high rectal tumor to possibly extend into the distal sigmoid, and best defined as T3bN2 disease. Much of the tumor appeared just above the anterior peritoneal reflection, but did not appear to involve the anterior peritoneal reflection.  CT chest/abdomen and pelvis on 06/19/2021  showed asymmetric wall thickening with narrowing in the rectosigmoid colon, prominent mesorectal lymph nodes, multiple small bilateral pulmonary nodules measuring up to 11mm, and hepatic steatosis.    Subsequently, the patient was referred to Dr. Lorenso Courier on 06/22/21 for further evaluation. During this visit, the patient noted her symptoms began approximately 2 months prior when she began noticing blood in her stool, which eventually became heavy to the point of appearing similar to menstrual bleeding. Patient stated that she became more bloated and had a harder time passing stool. The patient reported no further bleeding following her colonoscopy.   The patient was then referred to Dr. Ebony Hail [facility]) on 06/25/21 for further evaluation. Dr. Ebony Hail performed a flexible sigmoidoscopy which revealed the malignant tumor in the recto-sigmoid colon.   On 07/21/21, the patient underwent low anterior resection of the rectosigmoid colon with nodal biopsies. Pathology from the procedure revealed poorly differentiated invasive adenocarcinoma, invading through the muscularis propria into the pericolorectal tissue. Surgical margins were negative. 18/18 lymph nodes examined were negative for carcinoma. Anastomotic donut, low anterior resection also performed was negative for  malignancy.  Dr. Ebony Hail then referred the patient to Dr. Manuella Ghazi ([place]) on 08/31/21. Given stage 2 disease, Dr. Manuella Ghazi stated that the patient does not meet criteria for high risk disease and adjuvant  therapy.   Of note: the patient underwent left breast diagnostic mammogram and left breast US on 07/20/21 due to a palpable and painful abnormality within the left breast. Findings revealed no suspicious abnormality within the left breast at the site of palpable concern, and no mammographic evidence for malignancy. A cluster of cysts within the left breast were appreciated.    PREVIOUS RADIATION THERAPY: No  PAST MEDICAL HISTORY:  has a past medical history of Anemia (12/22/2012), Anxiety (2012), Arthritis, Bilateral anterior knee pain (2012), Brain tumor ([place]), Diabetes mellitus without complication ([place]), GERD (gastroesophageal reflux disease) (2000), Headache, Hyperlipidemia, Hypertension (2008), and Panic attack (2012).    PAST SURGICAL HISTORY: Past Surgical History:  Procedure Laterality Date   APPLICATION OF CRANIAL NAVIGATION N/A 07/02/2019   Procedure: APPLICATION OF CRANIAL NAVIGATION;  Surgeon: Judith Part, MD;  Location: [place];  Service: Neurosurgery;  Laterality: N/A;   CESAREAN SECTION  11/22/2000   MENISCUS REPAIR Left 11/22/2009   L knee for bilateral meniscal tear. Following acute injury.    PARTIAL HYSTERECTOMY  11/23/2007   RETROSIGMOID CRANIECTOMY FOR TUMOR RESECTION Left 07/02/2019   Procedure: Left Retrosigmoid craniotomy for tumor resection;  Surgeon: Judith Part, MD;  Location: [place];  Service: Neurosurgery;  Laterality: Left;    FAMILY HISTORY: family history includes Alcohol abuse in her mother; Diabetes in her mother; Drug abuse in her mother; Heart attack in her mother; Heart disease (age of onset:  54) in her mother; Hypertension in her father and mother; Stroke (age of onset: 10) in her mother. Family history of lung cancer in a maternal grandfather  and maternal aunt.  SOCIAL HISTORY:  reports that she has never smoked. She has never used smokeless tobacco. She reports current alcohol use. She reports that she does not use drugs.  ALLERGIES: Penicillins  MEDICATIONS:  Current Outpatient Medications  Medication Sig Dispense Refill   ALPRAZolam (XANAX) 0.5 MG tablet alprazolam 0.5 mg tablet  TAKE 1 TABLET BY MOUTH THREE TIMES DAILY AS NEEDED FOR ANXIETY     aspirin 325 MG tablet 1 tablet     chlorthalidone (HYGROTON) 25 MG tablet Take 25 mg by mouth daily at 2 PM.     Continuous Blood Gluc Sensor (FREESTYLE LIBRE 2 SENSOR) MISC See admin instructions.     diclofenac sodium (VOLTAREN) 1 % GEL Apply 1-2 g topically 4 (four) times daily as needed for pain.     gabapentin (NEURONTIN) 300 MG capsule Take 300 mg by mouth 3 (three) times daily. (Patient not taking: Reported on 05/29/2021)     glimepiride (AMARYL) 2 MG tablet Take 2 mg by mouth 2 (two) times daily as needed (elevated sugar level).     JARDIANCE 25 MG TABS tablet Take 25 mg by mouth daily.     lidocaine-prilocaine (EMLA) cream Apply 1 application topically as needed. 30 g 0   LORazepam (ATIVAN) 1 MG tablet Take 1-2 tablets (1-2 mg total) by mouth as directed. Take prior to MRI and /or CT scan and /or radiation treatments. 16 tablet 0   metFORMIN (GLUCOPHAGE-XR) 500 MG 24 hr tablet Take 500 mg by mouth daily.     Multiple Vitamins-Minerals (MULTI FOR HER) TABS See admin instructions.     omeprazole (PRILOSEC) 40 MG capsule Take 40 mg by mouth daily as needed (acid reflux/indigestion.).     ondansetron (ZOFRAN) 8 MG tablet Take 1 tablet (8 mg total) by mouth every 8 (eight) hours as needed. 30 tablet 0   potassium chloride SA (KLOR-CON) 20 MEQ tablet Take 20 mEq by mouth 2 (two) times daily.     prochlorperazine (COMPAZINE) 10 MG tablet Take 1 tablet (10 mg total) by mouth every 6 (six) hours as needed for nausea or vomiting. 30 tablet 0   rosuvastatin (CRESTOR) 20 MG tablet Take 20  mg by mouth daily.      TRULICITY 5.00 BB/0.4UG SOPN Inject 0.75 mg into the skin once a week. Thursday     No current facility-administered medications for this encounter.    REVIEW OF SYSTEMS:  As above.   PHYSICAL EXAM:  height is 5\' 5"  (1.651 m) and weight is 234 lb 2 oz (106.2 kg). Her temporal temperature is 97.4 F (36.3 C) (abnormal). Her blood pressure is 120/84 and her pulse is 97. Her respiration is 18 and oxygen saturation is 97%.   General: Alert and oriented, in mild distress at times. HEENT: Head is normocephalic. Extraocular movements are intact.  Musculoskeletal: symmetric strength and muscle tone throughout. Neurologic: Cranial nerves II through XII are grossly intact. No obvious focalities. Speech is fluent. Coordination is intact. Psychiatric: Judgment and insight are intact. Affect is appropriate.   LABORATORY DATA:  Lab Results  Component Value Date   WBC 7.4 06/22/2021   HGB 16.8 (H) 06/22/2021   HCT 50.9 (H) 06/22/2021   MCV 85.3 06/22/2021   PLT 270 06/22/2021   CMP     Component Value Date/Time  NA 140 06/22/2021 1047   NA CANCELED 12/15/2017 1150   K 4.0 06/22/2021 1047   K 3.7 01/03/2014 0000   CL 102 06/22/2021 1047   CL 100 01/03/2014 0000   CO2 29 06/22/2021 1047   CO2 28 01/03/2014 0000   GLUCOSE 139 (H) 06/22/2021 1047   BUN 20 06/22/2021 1047   BUN CANCELED 12/15/2017 1150   BUN 13 01/03/2014 0000   CREATININE 0.99 06/22/2021 1047   CREATININE 0.81 06/06/2014 1557   CALCIUM 10.6 (H) 06/22/2021 1047   CALCIUM 10.3 01/03/2014 0000   PROT 8.2 (H) 06/22/2021 1047   PROT 7.0 01/03/2014 0000   ALBUMIN 4.3 06/22/2021 1047   ALBUMIN 4.0 01/03/2014 0000   AST 15 06/22/2021 1047   ALT 17 06/22/2021 1047   ALKPHOS 57 06/22/2021 1047   ALKPHOS 50 01/03/2014 0000   BILITOT 1.5 (H) 06/22/2021 1047   GFRNONAA >60 06/22/2021 1047   GFRNONAA >89 01/03/2014 0000   GFRAA >60 06/11/2020 1311   GFRAA >89 01/03/2014 0000         RADIOGRAPHY:  MR BRAIN W WO CONTRAST  Result Date: 09/11/2021 CLINICAL DATA:  Follow-up known meningioma. EXAM: MRI HEAD WITHOUT AND WITH CONTRAST TECHNIQUE: Multiplanar, multiecho pulse sequences of the brain and surrounding structures were obtained without and with intravenous contrast. CONTRAST:  29mL GADAVIST GADOBUTROL 1 MMOL/ML IV SOLN COMPARISON:  10/03/2020 FINDINGS: Brain: Diffusion imaging does not show any acute or subacute infarction. Other than mild mass-effect upon the left side of the pons. No focal abnormality affects the brainstem or cerebellum. Cerebral hemispheres are normal. No stroke, hydrocephalus or extra-axial fluid collection. Previous craniotomy on the left for partial resection left CP angle region meningioma. Residual tumor along the inferior anterior tentorium is slightly larger, measuring about 7 x 19 mm today. Mild mass-effect upon the left side of the pons as noted above. Tumor is again seen to extend to [place] on the left. Vascular: Major vessels at the base of the brain show flow. Skull and upper cervical spine: Otherwise negative Sinuses/Orbits: Clear/normal Other: None IMPRESSION: Slight further enlargement of a meningioma along the anterior tentorium on the left, maximal measurements today 7 x 19 mm compared with 6 x 17 mm previously. Slight mass-effect upon the left side of the pons. Extension into the Meckel's cave region on the left. If further intervention is considered, or on further routine follow-up, consider utilization of temporal bone MRI for higher resolution evaluation of this area. Electronically Signed   By: Nelson Chimes M.D.   On: 09/11/2021 13:23      IMPRESSION/PLAN: This is a very pleasant 50 year old female with progressive grade 1 meningioma following previous resection  She has been reviewed at our CNS tumor board.  The consensus is that she is a good candidate for salvage radiosurgery.  I had a lengthy discussion with the patient after reviewing their MRI  results with them.  We spoke about stereotactic radiosurgery to the meningioma.  Anticipate that she will need 5 fractions to be given every other day.  We spoke about the need for a high-quality MRI to plan her treatment.  I provided a prescription for lorazepam to help her with her claustrophobia during imaging appointments and treatment appointments.  We spoke about the need for stereotactic mask to mobilize her during treatment.  We discussed potential side effects of treatment which may include but not necessarily be limited to fatigue, headache, cranial nerve injury, brain injury, hearing loss, radionecrosis.  We discussed how the potential benefits of radiation outweigh the risks.  She is enthusiastic about proceeding with treatment.  Consent form has been signed and placed in her chart.  MRI will take place followed by CT simulation for treatment planning.  Care will be coordinated with neurosurgery.   On date of service, in total, I spent 40 minutes on this encounter. Patient was seen in person today.  __________________________________________   Eppie Gibson, MD  This document serves as a record of services personally performed by Eppie Gibson, MD. It was created on her behalf by Roney Mans, a trained medical scribe. The creation of this record is based on the scribe's personal observations and the provider's statements to them. This document has been checked and approved by the attending provider.

## 2021-09-28 ENCOUNTER — Encounter: Payer: Self-pay | Admitting: Radiation Oncology

## 2021-09-28 ENCOUNTER — Other Ambulatory Visit: Payer: Self-pay

## 2021-09-28 ENCOUNTER — Ambulatory Visit
Admission: RE | Admit: 2021-09-28 | Discharge: 2021-09-28 | Disposition: A | Discharge status: Home / Self Care | Payer: 59 | Source: Ambulatory Visit | Attending: Radiation Oncology | Admitting: Radiation Oncology

## 2021-09-28 VITALS — BP 120/84 | HR 97 | Temp 97.4°F | Resp 18 | Ht 65.0 in | Wt 234.1 lb

## 2021-09-28 DIAGNOSIS — E119 Type 2 diabetes mellitus without complications: Secondary | ICD-10-CM | POA: Insufficient documentation

## 2021-09-28 DIAGNOSIS — N6002 Solitary cyst of left breast: Secondary | ICD-10-CM | POA: Diagnosis not present

## 2021-09-28 DIAGNOSIS — D320 Benign neoplasm of cerebral meninges: Secondary | ICD-10-CM

## 2021-09-28 DIAGNOSIS — Z7984 Long term (current) use of oral hypoglycemic drugs: Secondary | ICD-10-CM | POA: Diagnosis not present

## 2021-09-28 DIAGNOSIS — E785 Hyperlipidemia, unspecified: Secondary | ICD-10-CM | POA: Diagnosis not present

## 2021-09-28 DIAGNOSIS — M129 Arthropathy, unspecified: Secondary | ICD-10-CM | POA: Insufficient documentation

## 2021-09-28 DIAGNOSIS — C20 Malignant neoplasm of rectum: Secondary | ICD-10-CM | POA: Diagnosis not present

## 2021-09-28 DIAGNOSIS — Z801 Family history of malignant neoplasm of trachea, bronchus and lung: Secondary | ICD-10-CM | POA: Diagnosis not present

## 2021-09-28 DIAGNOSIS — K219 Gastro-esophageal reflux disease without esophagitis: Secondary | ICD-10-CM | POA: Diagnosis not present

## 2021-09-28 DIAGNOSIS — D649 Anemia, unspecified: Secondary | ICD-10-CM | POA: Insufficient documentation

## 2021-09-28 DIAGNOSIS — F40240 Claustrophobia: Secondary | ICD-10-CM | POA: Diagnosis not present

## 2021-09-28 DIAGNOSIS — I10 Essential (primary) hypertension: Secondary | ICD-10-CM | POA: Diagnosis not present

## 2021-09-28 DIAGNOSIS — Z79899 Other long term (current) drug therapy: Secondary | ICD-10-CM | POA: Diagnosis not present

## 2021-09-28 DIAGNOSIS — D329 Benign neoplasm of meninges, unspecified: Secondary | ICD-10-CM

## 2021-09-28 MED ORDER — LORAZEPAM 1 MG PO TABS: 1.0000 mg | ORAL_TABLET | ORAL | 0 refills | Status: DC

## 2021-09-29 ENCOUNTER — Encounter: Payer: Self-pay | Admitting: Radiation Oncology

## 2021-09-29 ENCOUNTER — Other Ambulatory Visit: Payer: Self-pay | Admitting: Radiation Therapy

## 2021-09-29 DIAGNOSIS — D329 Benign neoplasm of meninges, unspecified: Secondary | ICD-10-CM

## 2021-10-07 ENCOUNTER — Ambulatory Visit
Admission: RE | Admit: 2021-10-07 | Discharge: 2021-10-07 | Disposition: A | Discharge status: Home / Self Care | Payer: 59 | Source: Ambulatory Visit | Attending: Radiation Oncology | Admitting: Radiation Oncology

## 2021-10-07 ENCOUNTER — Ambulatory Visit: Payer: 59

## 2021-10-07 DIAGNOSIS — D320 Benign neoplasm of cerebral meninges: Secondary | ICD-10-CM

## 2021-10-07 DIAGNOSIS — Z510 Encounter for antineoplastic radiation therapy: Secondary | ICD-10-CM | POA: Insufficient documentation

## 2021-10-07 DIAGNOSIS — C320 Malignant neoplasm of glottis: Secondary | ICD-10-CM | POA: Insufficient documentation

## 2021-10-07 MED ORDER — GADOBENATE DIMEGLUMINE 529 MG/ML IV SOLN
20.0000 mL | Freq: Once | INTRAVENOUS | Status: AC | PRN
  Administered 2021-10-07: 20 mL via INTRAVENOUS

## 2021-10-08 ENCOUNTER — Other Ambulatory Visit: Payer: Self-pay

## 2021-10-08 ENCOUNTER — Inpatient Hospital Stay: Payer: 59 | Attending: Hematology and Oncology

## 2021-10-08 DIAGNOSIS — C20 Malignant neoplasm of rectum: Secondary | ICD-10-CM | POA: Insufficient documentation

## 2021-10-08 DIAGNOSIS — D329 Benign neoplasm of meninges, unspecified: Secondary | ICD-10-CM

## 2021-10-08 LAB — CBC WITH DIFFERENTIAL (CANCER CENTER ONLY)
Abs Immature Granulocytes: 0.02 10*3/uL (ref 0.00–0.07)
Basophils Absolute: 0.1 10*3/uL (ref 0.0–0.1)
Basophils Relative: 1 %
Eosinophils Absolute: 0.3 10*3/uL (ref 0.0–0.5)
Eosinophils Relative: 4 %
HCT: 47.3 % — ABNORMAL HIGH (ref 36.0–46.0)
Hemoglobin: 15.3 g/dL — ABNORMAL HIGH (ref 12.0–15.0)
Immature Granulocytes: 0 %
Lymphocytes Relative: 32 %
Lymphs Abs: 2.5 10*3/uL (ref 0.7–4.0)
MCH: 27.5 pg (ref 26.0–34.0)
MCHC: 32.3 g/dL (ref 30.0–36.0)
MCV: 85.1 fL (ref 80.0–100.0)
Monocytes Absolute: 0.6 10*3/uL (ref 0.1–1.0)
Monocytes Relative: 8 %
Neutro Abs: 4.3 10*3/uL (ref 1.7–7.7)
Neutrophils Relative %: 55 %
Platelet Count: 293 10*3/uL (ref 150–400)
RBC: 5.56 MIL/uL — ABNORMAL HIGH (ref 3.87–5.11)
RDW: 13.4 % (ref 11.5–15.5)
WBC Count: 7.7 10*3/uL (ref 4.0–10.5)
nRBC: 0 % (ref 0.0–0.2)

## 2021-10-08 LAB — CMP (CANCER CENTER ONLY)
ALT: 12 U/L (ref 0–44)
AST: 12 U/L — ABNORMAL LOW (ref 15–41)
Albumin: 3.8 g/dL (ref 3.5–5.0)
Alkaline Phosphatase: 62 U/L (ref 38–126)
Anion gap: 10 (ref 5–15)
BUN: 17 mg/dL (ref 6–20)
CO2: 27 mmol/L (ref 22–32)
Calcium: 10 mg/dL (ref 8.9–10.3)
Chloride: 103 mmol/L (ref 98–111)
Creatinine: 0.79 mg/dL (ref 0.44–1.00)
GFR, Estimated: >60 mL/min (ref >60)
Glucose, Bld: 110 mg/dL — ABNORMAL HIGH (ref 70–99)
Potassium: 3.6 mmol/L (ref 3.5–5.1)
Sodium: 140 mmol/L (ref 135–145)
Total Bilirubin: 0.8 mg/dL (ref 0.3–1.2)
Total Protein: 7.7 g/dL (ref 6.5–8.1)

## 2021-10-09 ENCOUNTER — Ambulatory Visit
Admission: RE | Admit: 2021-10-09 | Discharge: 2021-10-09 | Disposition: A | Discharge status: Home / Self Care | Payer: 59 | Source: Ambulatory Visit | Attending: Radiation Oncology | Admitting: Radiation Oncology

## 2021-10-09 ENCOUNTER — Other Ambulatory Visit: Payer: Self-pay

## 2021-10-09 VITALS — BP 123/75 | HR 83 | Temp 96.9°F | Resp 18 | Wt 237.2 lb

## 2021-10-09 DIAGNOSIS — Z510 Encounter for antineoplastic radiation therapy: Secondary | ICD-10-CM | POA: Diagnosis not present

## 2021-10-09 DIAGNOSIS — F419 Anxiety disorder, unspecified: Secondary | ICD-10-CM

## 2021-10-09 DIAGNOSIS — D329 Benign neoplasm of meninges, unspecified: Secondary | ICD-10-CM

## 2021-10-09 DIAGNOSIS — C320 Malignant neoplasm of glottis: Secondary | ICD-10-CM | POA: Diagnosis present

## 2021-10-09 MED ORDER — SODIUM CHLORIDE 0.9% FLUSH
10.0000 mL | Freq: Once | INTRAVENOUS | Status: AC
  Administered 2021-10-09: 10 mL via INTRAVENOUS

## 2021-10-09 MED ORDER — LORAZEPAM 2 MG/ML IJ SOLN
1.0000 mg | Freq: Once | INTRAMUSCULAR | Status: AC
  Administered 2021-10-09: 1 mg via INTRAVENOUS
  Filled 2021-10-09: qty 1

## 2021-10-09 NOTE — Progress Notes (Signed)
Has armband been applied?  Yes.    Does patient have an allergy to IV contrast dye?: No.   Has patient ever received premedication for IV contrast dye?: No.   Date of lab work: October 08, 2021 BUN: 17 CR: 0.99 eGFR: >60  IV site: antecubital left, condition patent and no redness  Has IV site been added to flowsheet?  Yes.    BP 123/75 (BP Location: Left Arm, Patient Position: Sitting)   Pulse 83   Temp (!) 96.9 F (36.1 C) (Temporal)   Resp 18   Wt 237 lb 4 oz (107.6 kg)   SpO2 96%   BMI 39.48 kg/m

## 2021-10-14 ENCOUNTER — Other Ambulatory Visit: Payer: Self-pay

## 2021-10-14 ENCOUNTER — Ambulatory Visit
Admission: RE | Admit: 2021-10-14 | Discharge: 2021-10-14 | Disposition: A | Discharge status: Home / Self Care | Payer: 59 | Source: Ambulatory Visit | Attending: Radiation Oncology | Admitting: Radiation Oncology

## 2021-10-14 VITALS — BP 131/91 | HR 81 | Temp 96.7°F | Resp 19

## 2021-10-14 DIAGNOSIS — D329 Benign neoplasm of meninges, unspecified: Secondary | ICD-10-CM

## 2021-10-14 DIAGNOSIS — D320 Benign neoplasm of cerebral meninges: Secondary | ICD-10-CM

## 2021-10-14 DIAGNOSIS — C320 Malignant neoplasm of glottis: Secondary | ICD-10-CM | POA: Diagnosis not present

## 2021-10-14 DIAGNOSIS — F411 Generalized anxiety disorder: Secondary | ICD-10-CM

## 2021-10-14 MED ORDER — LORAZEPAM 2 MG/ML IJ SOLN
1.0000 mg | Freq: Once | INTRAMUSCULAR | Status: AC
  Administered 2021-10-14: 1 mg via INTRAVENOUS
  Filled 2021-10-14: qty 1

## 2021-10-14 NOTE — Progress Notes (Addendum)
  Radiation Oncology         [phone] ________________________________  Name: Carolyn Holland MRN: [id number]  Date: 10/14/2021  DOB: Jul 10, 1971  Stereotactic Treatment Procedure Note  SPECIAL TREATMENT PROCEDURE  Outpatient  DIAGNOSIS: D32.0    ICD-10-CM   1. GAD (generalized anxiety disorder)  F41.1 LORazepam (ATIVAN) injection 1 mg    2. Meningioma ([place])  D32.9     3. Intracranial meningioma ([place])  D32.0       3D TREATMENT PLANNING AND DOSIMETRY:  The patient's radiation plan was reviewed and approved by neurosurgery and radiation oncology prior to treatment.  It showed 3-dimensional radiation distributions overlaid onto the planning CT/MRI image set.  The [facility] for the target structures as well as the organs at risk were reviewed. The documentation of the 3D plan and dosimetry are filed in the radiation oncology EMR.  NARRATIVE:  Jordana Dugue Minch was brought to the [employer] stereotactic radiation treatment machine and placed supine on the CT couch. The head frame was applied, and the patient was set up for stereotactic radiosurgery.  Neurosurgery was present for the set-up and delivery  SIMULATION VERIFICATION:  In the couch zero-angle position, the patient underwent Exactrac imaging using the Brainlab system with orthogonal KV images.  These were carefully aligned and repeated to confirm treatment position for each of the isocenters.  The Exactrac snap film verification was repeated at each couch angle.  SPECIAL TREATMENT PROCEDURE: Lynita Lombard Mcdill received stereotactic radiosurgery to the following targets:  PTV1: LeftTentCereb43mm received 5 Gray using IMRT/SRS technique, 6 MV FFF photons.  Anticipate total dose of 25 Gray in 5 fractions with at least 1 day in between each fraction.   This constitutes a special treatment procedure due to the ablative dose delivered and the technical nature of treatment.  This highly technical modality of treatment ensures that the ablative dose  is centered on the patient's tumor while sparing normal tissues from excessive dose and risk of detrimental effects.  STEREOTACTIC TREATMENT MANAGEMENT:  Following delivery, the patient was transported to nursing in stable condition and monitored for possible acute effects.  Vital signs were recorded BP (!) 131/91 (BP Location: Left Arm, Patient Position: Sitting)   Pulse 81   Temp (!) 96.7 F (35.9 C) (Temporal)   Resp 19   SpO2 99% . The patient tolerated treatment without significant acute effects, and was discharged to home in stable condition.    PLAN: Follow-up as scheduled for subsequent fraction.  ________________________________   Eppie Gibson, MD

## 2021-10-14 NOTE — Progress Notes (Signed)
Nurse monitoring complete status post 1 of 5 SRS treatments. Patient without complaints. Patient denies new or worsening neurologic symptoms. Vitals stable. Instructed patient to avoid strenuous activity for the next 24 hours. Instructed patient to call [phone] with needs related to treatment after hours or over the weekend. Patient verbalized understanding. Patient assisted out of clinic via wheelchair to [place] area to wait for daughter without incident   Vitals:   10/14/21 1630  BP: (!) 131/91  Pulse: 81  Resp: 19  Temp: (!) 96.7 F (35.9 C)  SpO2: 99%

## 2021-10-14 NOTE — Op Note (Signed)
  Name: Carolyn Holland  MRN: [id number]  Date: 10/14/2021   DOB: 08-Jan-1971  Stereotactic Radiosurgery Operative Note  PRE-OPERATIVE DIAGNOSIS:  Intracranial meningioma  POST-OPERATIVE DIAGNOSIS:  Same  PROCEDURE:  Stereotactic Radiosurgery  SURGEON:  Judith Part, MD  NARRATIVE: The patient underwent a radiation treatment planning session in the radiation oncology simulation suite under the care of the radiation oncology physician and physicist.  I participated closely in the radiation treatment planning afterwards. The patient underwent planning CT which was fused to 3T high resolution MRI with 1 mm axial slices.  These images were fused on the planning system.  We contoured the gross target volumes and subsequently expanded this to yield the Planning Target Volume. I actively participated in the planning process.  I helped to define and review the target contours and also the contours of the optic pathway, eyes, brainstem and selected nearby organs at risk.  All the dose constraints for critical structures were reviewed and compared to AAPM Task Group 101.  The prescription dose conformity was reviewed.  I approved the plan electronically.    Accordingly, Carolyn Holland was brought to the TrueBeam stereotactic radiation treatment linac and placed in the custom immobilization mask.  The patient was aligned according to the IR fiducial markers with BrainLab Exactrac, then orthogonal x-rays were used in ExacTrac with the 6DOF robotic table and the shifts were made to align the patient  Carolyn Holland received stereotactic radiosurgery uneventfully.    Lesions treated:  1   Complex lesions treated:  1 (>3.5 cm, <45mm of optic path, or within the brainstem)   The detailed description of the procedure is recorded in the radiation oncology procedure note.  I was present for the duration of the procedure.  DISPOSITION:  Following delivery, the patient was transported to nursing in stable  condition and monitored for possible acute effects to be discharged to home in stable condition with follow-up in one month.  Judith Part, MD 10/14/2021 5:11 PM

## 2021-10-19 ENCOUNTER — Ambulatory Visit
Admission: RE | Admit: 2021-10-19 | Discharge: 2021-10-19 | Disposition: A | Discharge status: Home / Self Care | Payer: 59 | Source: Ambulatory Visit | Attending: Radiation Oncology | Admitting: Radiation Oncology

## 2021-10-19 ENCOUNTER — Other Ambulatory Visit: Payer: Self-pay

## 2021-10-19 ENCOUNTER — Encounter: Payer: Self-pay | Admitting: General Practice

## 2021-10-19 VITALS — BP 131/88 | HR 88 | Temp 96.7°F | Resp 18

## 2021-10-19 DIAGNOSIS — C20 Malignant neoplasm of rectum: Secondary | ICD-10-CM | POA: Diagnosis not present

## 2021-10-19 DIAGNOSIS — Z79899 Other long term (current) drug therapy: Secondary | ICD-10-CM | POA: Diagnosis not present

## 2021-10-19 DIAGNOSIS — F419 Anxiety disorder, unspecified: Secondary | ICD-10-CM

## 2021-10-19 DIAGNOSIS — C320 Malignant neoplasm of glottis: Secondary | ICD-10-CM | POA: Diagnosis not present

## 2021-10-19 DIAGNOSIS — D320 Benign neoplasm of cerebral meninges: Secondary | ICD-10-CM

## 2021-10-19 MED ORDER — LORAZEPAM 2 MG/ML IJ SOLN
1.2500 mg | Freq: Once | INTRAMUSCULAR | Status: AC
  Administered 2021-10-19: 15:00:00 1.25 mg via INTRAMUSCULAR
  Filled 2021-10-19: qty 1

## 2021-10-19 NOTE — Progress Notes (Signed)
Nurse monitoring complete status post 2 of 5 SRS treatments. Patient without complaints. Patient denies new or worsening neurologic symptoms. Vitals stable. Instructed patient to avoid strenuous activity for the next 24 hours.  Instructed patient to call [phone] with needs related to treatment after hours or over the weekend. Patient verbalized understanding and agreement. Escorted out of clinic via wheelchair to daughter's vehicle without incident   Vitals:   10/19/21 1555  BP: 131/88  Pulse: 88  Resp: 18  Temp: (!) 96.7 F (35.9 C)  SpO2: 100%

## 2021-10-19 NOTE — Progress Notes (Signed)
[facility] Spiritual Care Note  Referred by Willodean Rosenthal for emotional support while waiting for treatment. Ms Hartfield was very receptive to Spiritual Care and used the opportunity to share and process her story and distress, particularly the challenge of working during treatment. She was also appreciative of the bag of encouragement that I brought, including a handmade lap blanket, and information about free [place] and [employer] support programming. We plan to follow up by phone for further support.   [place], [place], [facility] Pager [phone] Voicemail [phone]

## 2021-10-20 NOTE — Progress Notes (Signed)
  Radiation Oncology         [phone] ________________________________  Name: VENEDA KIRKSEY MRN: [id number]  Date: 10/19/2021  DOB: 01/12/1971  Stereotactic Treatment Procedure Note  SPECIAL TREATMENT PROCEDURE  Outpatient  DIAGNOSIS: D32.0    ICD-10-CM   1. Intracranial meningioma ([place])  D32.0       3D TREATMENT PLANNING AND DOSIMETRY:  The patient's radiation plan was reviewed and approved by neurosurgery and radiation oncology prior to treatment.  It showed 3-dimensional radiation distributions overlaid onto the planning CT/MRI image set.  The [facility] for the target structures as well as the organs at risk were reviewed. The documentation of the 3D plan and dosimetry are filed in the radiation oncology EMR.  NARRATIVE:  Samiyyah Moffa Kiper was brought to the TrueBeam stereotactic radiation treatment machine and placed supine on the CT couch. The head frame was applied, and the patient was set up for stereotactic radiosurgery.  Neurosurgery was present for the set-up and delivery  SIMULATION VERIFICATION:  In the couch zero-angle position, the patient underwent Exactrac imaging using the Brainlab system with orthogonal KV images.  These were carefully aligned and repeated to confirm treatment position for each of the isocenters.  The Exactrac snap film verification was repeated at each couch angle.  SPECIAL TREATMENT PROCEDURE: Lynita Lombard Yanda received stereotactic radiosurgery to the following targets:  PTV1: LeftTentCereb17mm received 5 Gray using IMRT/SRS technique, 6 MV FFF photons.  Anticipate total dose of 25 Gray in 5 fractions with at least 1 day in between each fraction.   This constitutes a special treatment procedure due to the ablative dose delivered and the technical nature of treatment.  This highly technical modality of treatment ensures that the ablative dose is centered on the patient's tumor while sparing normal tissues from excessive dose and risk of detrimental  effects.  STEREOTACTIC TREATMENT MANAGEMENT:  Following delivery, the patient was transported to nursing in stable condition and monitored for possible acute effects.  Vital signs were recorded BP 131/88 (BP Location: Right Arm, Patient Position: Sitting)   Pulse 88   Temp (!) 96.7 F (35.9 C) (Temporal)   Resp 18   SpO2 100% . The patient tolerated treatment without significant acute effects, and was discharged to home in stable condition.    PLAN: Follow-up as scheduled for subsequent fraction.  ________________________________   Eppie Gibson, MD

## 2021-10-21 ENCOUNTER — Other Ambulatory Visit: Payer: Self-pay

## 2021-10-21 ENCOUNTER — Ambulatory Visit
Admission: RE | Admit: 2021-10-21 | Discharge: 2021-10-21 | Disposition: A | Discharge status: Home / Self Care | Payer: 59 | Source: Ambulatory Visit | Attending: Radiation Oncology | Admitting: Radiation Oncology

## 2021-10-21 VITALS — BP 123/89 | HR 89 | Resp 17

## 2021-10-21 DIAGNOSIS — C320 Malignant neoplasm of glottis: Secondary | ICD-10-CM | POA: Diagnosis not present

## 2021-10-21 DIAGNOSIS — C20 Malignant neoplasm of rectum: Secondary | ICD-10-CM | POA: Diagnosis not present

## 2021-10-21 DIAGNOSIS — D320 Benign neoplasm of cerebral meninges: Secondary | ICD-10-CM

## 2021-10-21 DIAGNOSIS — F419 Anxiety disorder, unspecified: Secondary | ICD-10-CM

## 2021-10-21 MED ORDER — LORAZEPAM 2 MG/ML IJ SOLN
1.2500 mg | Freq: Once | INTRAMUSCULAR | Status: AC
  Administered 2021-10-21: 1.25 mg via INTRAMUSCULAR
  Filled 2021-10-21: qty 1

## 2021-10-21 NOTE — Progress Notes (Signed)
Nurse monitoring complete status post 3 of 5 SRS treatments. Patient without complaints. Patient denies new or worsening neurologic symptoms. Vitals stable. Instructed patient to avoid strenuous activity for the next 24 hours.  Instructed patient to call [phone] with needs related to treatment after hours or over the weekend. Patient verbalized understanding. Patient escorted out of clinic via wheelchair to daughter's vehicle without incident  Vitals:   10/21/21 1611  BP: 123/89  Pulse: 89  Resp: 17  SpO2: 100%

## 2021-10-21 NOTE — Progress Notes (Signed)
  Radiation Oncology         [phone] ________________________________  Name: ZANI KYLLONEN MRN: [id number]  Date: 10/21/2021  DOB: 05-08-1971  Stereotactic Treatment Procedure Note  SPECIAL TREATMENT PROCEDURE  Outpatient  DIAGNOSIS: D32.0    ICD-10-CM   1. Intracranial meningioma ([place])  D32.0       3D TREATMENT PLANNING AND DOSIMETRY:  The patient's radiation plan was reviewed and approved by neurosurgery and radiation oncology prior to treatment.  It showed 3-dimensional radiation distributions overlaid onto the planning CT/MRI image set.  The [facility] for the target structures as well as the organs at risk were reviewed. The documentation of the 3D plan and dosimetry are filed in the radiation oncology EMR.  NARRATIVE:  Tywana Robotham Cui was brought to the TrueBeam stereotactic radiation treatment machine and placed supine on the CT couch. The head frame was applied, and the patient was set up for stereotactic radiosurgery.  Neurosurgery was present for the set-up and delivery  SIMULATION VERIFICATION:  In the couch zero-angle position, the patient underwent Exactrac imaging using the Brainlab system with orthogonal KV images.  These were carefully aligned and repeated to confirm treatment position for each of the isocenters.  The Exactrac snap film verification was repeated at each couch angle.  SPECIAL TREATMENT PROCEDURE: Lynita Lombard Aulds received stereotactic radiosurgery to the following targets:  PTV1: LeftTentCereb4mm received 5 Gray using IMRT/SRS technique, 6 MV FFF photons.  Anticipate total dose of 25 Gray in 5 fractions with at least 1 day in between each fraction.   This constitutes a special treatment procedure due to the ablative dose delivered and the technical nature of treatment.  This highly technical modality of treatment ensures that the ablative dose is centered on the patient's tumor while sparing normal tissues from excessive dose and risk of detrimental  effects.  STEREOTACTIC TREATMENT MANAGEMENT:  Following delivery, the patient was transported to nursing in stable condition and monitored for possible acute effects.  Vital signs were recorded BP 123/89 (BP Location: Left Arm, Patient Position: Sitting)   Pulse 89   Resp 17   SpO2 100% . The patient tolerated treatment without significant acute effects, and was discharged to home in stable condition.    PLAN: Follow-up as scheduled for subsequent fraction.  ________________________________   Eppie Gibson, MD

## 2021-10-23 ENCOUNTER — Ambulatory Visit: Payer: 59

## 2021-10-23 ENCOUNTER — Ambulatory Visit: Payer: 59 | Admitting: Radiation Oncology

## 2021-10-26 ENCOUNTER — Other Ambulatory Visit: Payer: Self-pay

## 2021-10-26 ENCOUNTER — Ambulatory Visit
Admission: RE | Admit: 2021-10-26 | Discharge: 2021-10-26 | Disposition: A | Discharge status: Home / Self Care | Payer: 59 | Source: Ambulatory Visit | Attending: Radiation Oncology | Admitting: Radiation Oncology

## 2021-10-26 ENCOUNTER — Encounter: Payer: Self-pay | Admitting: General Practice

## 2021-10-26 VITALS — BP 115/90 | HR 86 | Temp 97.6°F | Resp 18

## 2021-10-26 DIAGNOSIS — D320 Benign neoplasm of cerebral meninges: Secondary | ICD-10-CM | POA: Insufficient documentation

## 2021-10-26 DIAGNOSIS — Z510 Encounter for antineoplastic radiation therapy: Secondary | ICD-10-CM | POA: Diagnosis not present

## 2021-10-26 DIAGNOSIS — F419 Anxiety disorder, unspecified: Secondary | ICD-10-CM

## 2021-10-26 MED ORDER — LORAZEPAM 2 MG/ML IJ SOLN
0.7500 mg | Freq: Once | INTRAMUSCULAR | Status: AC
  Administered 2021-10-26: 0.75 mg via INTRAMUSCULAR
  Filled 2021-10-26: qty 1

## 2021-10-26 NOTE — Progress Notes (Signed)
[facility] Spiritual Care Note  Left voicemail encouraging return call.   Chaplain Georgiana Spillane, MDiv, [facility] Pager [phone] Voicemail [phone]  

## 2021-10-27 ENCOUNTER — Telehealth: Payer: Self-pay | Admitting: Radiation Therapy

## 2021-10-27 NOTE — Telephone Encounter (Signed)
I called Carolyn Holland to see if she is willing to come in for her final treatment Wed 12/7. She said that the medication she takes to manage her claustrophobia during treatment, affects her ability to answer phones and stay alert at work. She is not willing to do the treatment on Wed because she will not be able to keep up at work the following day. She feels doing the treatment on Friday is best because she will be able to rest over the weekend. Per her request, the final treatment has been rescheduled to Friday 12/9.   She has been instructed to come in at 1:00 for pre-med and then treat at 1:45.   Mont Dutton R.T.(R)(T) Radiation Special Procedures Navigator

## 2021-10-28 ENCOUNTER — Ambulatory Visit: Payer: 59 | Admitting: Radiation Oncology

## 2021-10-28 ENCOUNTER — Ambulatory Visit: Payer: 59

## 2021-10-30 ENCOUNTER — Ambulatory Visit
Admission: RE | Admit: 2021-10-30 | Discharge: 2021-10-30 | Disposition: A | Discharge status: Home / Self Care | Payer: 59 | Source: Ambulatory Visit | Attending: Radiation Oncology | Admitting: Radiation Oncology

## 2021-10-30 ENCOUNTER — Other Ambulatory Visit: Payer: Self-pay

## 2021-10-30 ENCOUNTER — Encounter: Payer: Self-pay | Admitting: Radiation Oncology

## 2021-10-30 VITALS — BP 117/94 | HR 96 | Temp 97.1°F | Resp 18

## 2021-10-30 VITALS — BP 140/89 | HR 92 | Temp 97.0°F | Resp 20 | Wt 243.0 lb

## 2021-10-30 DIAGNOSIS — F419 Anxiety disorder, unspecified: Secondary | ICD-10-CM

## 2021-10-30 DIAGNOSIS — D320 Benign neoplasm of cerebral meninges: Secondary | ICD-10-CM | POA: Insufficient documentation

## 2021-10-30 MED ORDER — LORAZEPAM 2 MG/ML IJ SOLN
0.7500 mg | Freq: Once | INTRAMUSCULAR | Status: AC
  Administered 2021-10-30: 2 mg via INTRAMUSCULAR
  Filled 2021-10-30: qty 1

## 2021-10-30 NOTE — Progress Notes (Signed)
  Radiation Oncology         [phone] ________________________________  Name: Carolyn Holland MRN: [id number]  Date: 10/26/2021  DOB: 06-05-1971  Stereotactic Treatment Procedure Note  SPECIAL TREATMENT PROCEDURE  Outpatient  DIAGNOSIS: D32.0    ICD-10-CM   1. Intracranial meningioma ([place])  D32.0        3D TREATMENT PLANNING AND DOSIMETRY:  The patient's radiation plan was reviewed and approved by neurosurgery and radiation oncology prior to treatment.  It showed 3-dimensional radiation distributions overlaid onto the planning CT/MRI image set.  The [facility] for the target structures as well as the organs at risk were reviewed. The documentation of the 3D plan and dosimetry are filed in the radiation oncology EMR.  NARRATIVE:  Carolyn Holland was brought to the TrueBeam stereotactic radiation treatment machine and placed supine on the CT couch. The head frame was applied, and the patient was set up for stereotactic radiosurgery.  Neurosurgery was present for the set-up and delivery  SIMULATION VERIFICATION:  In the couch zero-angle position, the patient underwent Exactrac imaging using the Brainlab system with orthogonal KV images.  These were carefully aligned and repeated to confirm treatment position for each of the isocenters.  The Exactrac snap film verification was repeated at each couch angle.  SPECIAL TREATMENT PROCEDURE: Carolyn Holland received stereotactic radiosurgery to the following targets:  PTV1: LeftTentCereb71mm received 5 Gray using IMRT/SRS technique, 6 MV FFF photons.  Anticipate total dose of 25 Gray in 5 fractions with at least 1 day in between each fraction.   This constitutes a special treatment procedure due to the ablative dose delivered and the technical nature of treatment.  This highly technical modality of treatment ensures that the ablative dose is centered on the patient's tumor while sparing normal tissues from excessive dose and risk of detrimental  effects.  STEREOTACTIC TREATMENT MANAGEMENT:  Following delivery, the patient was transported to nursing in stable condition and monitored for possible acute effects.   The patient tolerated treatment without significant acute effects, and was discharged to home in stable condition.    PLAN: Follow-up as scheduled for subsequent fraction.  ________________________________   Eppie Gibson, MD

## 2021-10-30 NOTE — Progress Notes (Signed)
Nurse monitoring complete status post 5 of 5 SRS treatments. Patient without complaints. Patient denies new or worsening neurologic symptoms. Vitals stable. Instructed patient to avoid strenuous activity for the next 24 hours. Instructed patient to call [phone] with needs related to treatment after hours or over the weekend. Patient verbalized understanding and agreement. Patient assisted out of clinic via wheelchair to friend's vehicle without incident  Vitals:   10/30/21 1432  BP: (!) 117/94  Pulse: 96  Resp: 18  Temp: (!) 97.1 F (36.2 C)  SpO2: 100%

## 2021-10-30 NOTE — Progress Notes (Signed)
  Radiation Oncology         [phone] ________________________________  Name: Carolyn Holland MRN: [id number]  Date: 10/30/2021  DOB: 12-29-1970  Stereotactic Treatment Procedure Note  SPECIAL TREATMENT PROCEDURE  Outpatient  DIAGNOSIS: D32.0    ICD-10-CM   1. Intracranial meningioma ([place])  D32.0       3D TREATMENT PLANNING AND DOSIMETRY:  The patient's radiation plan was reviewed and approved by neurosurgery and radiation oncology prior to treatment.  It showed 3-dimensional radiation distributions overlaid onto the planning CT/MRI image set.  The [facility] for the target structures as well as the organs at risk were reviewed. The documentation of the 3D plan and dosimetry are filed in the radiation oncology EMR.  NARRATIVE:  Carolyn Holland was brought to the TrueBeam stereotactic radiation treatment machine and placed supine on the CT couch. The head frame was applied, and the patient was set up for stereotactic radiosurgery.  Neurosurgery was present for the set-up and delivery  SIMULATION VERIFICATION:  In the couch zero-angle position, the patient underwent Exactrac imaging using the Brainlab system with orthogonal KV images.  These were carefully aligned and repeated to confirm treatment position for each of the isocenters.  The Exactrac snap film verification was repeated at each couch angle.  SPECIAL TREATMENT PROCEDURE: Carolyn Holland received stereotactic radiosurgery to the following targets:  PTV1: LeftTentCereb10mm received 5 Gray using IMRT/SRS technique, 6 MV FFF photons.   She has received a total dose of 25 Gray in 5 fractions with at least 1 day in between each fraction.   This constitutes a special treatment procedure due to the ablative dose delivered and the technical nature of treatment.  This highly technical modality of treatment ensures that the ablative dose is centered on the patient's tumor while sparing normal tissues from excessive dose and risk of detrimental  effects.  STEREOTACTIC TREATMENT MANAGEMENT:  Following delivery, the patient was transported to nursing in stable condition and monitored for possible acute effects.  Vital signs were recorded BP (!) 117/94 (BP Location: Left Arm, Patient Position: Sitting)   Pulse 96   Temp (!) 97.1 F (36.2 C) (Temporal)   Resp 18   SpO2 100% . The patient tolerated treatment without significant acute effects, and was discharged to home in stable condition.    PLAN: She has completed radiosurgery.  Follow-up in 1 month.  Of note, the patient's treatment course was was a bit protracted as she postponed treatments due to lingering effects from lorazepam.  Nevertheless, she continued lorazepam due to significant claustrophobia and was otherwise able to complete all fractions as prescribed.  ________________________________   Eppie Gibson, MD

## 2021-11-25 ENCOUNTER — Encounter: Payer: Self-pay | Admitting: Hematology and Oncology

## 2021-11-25 NOTE — Progress Notes (Signed)
° °                                                                                                                                                          °  Patient Name: Carolyn Holland MRN: [id number] DOB: 25-Oct-1971 Referring Physician: [facility] Date of Service: 10/30/2021 [facility], [place]                                                        End Of Treatment Note  Diagnoses: D32.0-Benign neoplasm of cerebral meninges  Intent: Curative  Radiation Treatment Dates: 10/14/2021 through 10/30/2021 Site Technique Total Dose (Gy) Dose per Fx (Gy) Completed Fx Beam Energies  Brain: Brain_SRS IMRT 25/25 5 5/5 6XFFF   Narrative: The patient tolerated radiation therapy relatively well.    Plan: The patient will follow-up with radiation oncology in 24mo.  -----------------------------------  Eppie Gibson, MD

## 2021-11-27 ENCOUNTER — Telehealth: Payer: Self-pay

## 2021-11-27 ENCOUNTER — Ambulatory Visit: Payer: 59 | Admitting: Radiation Oncology

## 2021-11-27 NOTE — Telephone Encounter (Signed)
Called and left VM to see if patient would prefer to reschedule 1 month F/U appointment, or conduct over the phone with Dr. Isidore Moos. Provided my direct call back number for patient to call back with update

## 2021-12-02 ENCOUNTER — Encounter: Payer: Self-pay | Admitting: Radiation Therapy

## 2021-12-03 ENCOUNTER — Encounter: Payer: Self-pay | Admitting: Hematology and Oncology

## 2021-12-03 NOTE — Progress Notes (Signed)
Called Ms. Welcome about her missed follow-up with Dr. Isidore Moos. She is very busy with work and unable to come in person for a visit, but has agreed to have a telephone follow-up anytime after 2:30. I set up a telephone visit with Dr. Isidore Moos 1/13 @ 3:20.   Mont Dutton R.T.(R)(T) Radiation Special Procedures Navigator

## 2021-12-04 ENCOUNTER — Ambulatory Visit
Admission: RE | Admit: 2021-12-04 | Discharge: 2021-12-04 | Disposition: A | Discharge status: Home / Self Care | Payer: 59 | Source: Ambulatory Visit | Attending: Radiation Oncology | Admitting: Radiation Oncology

## 2021-12-04 DIAGNOSIS — D320 Benign neoplasm of cerebral meninges: Secondary | ICD-10-CM

## 2021-12-04 NOTE — Progress Notes (Signed)
Radiation Oncology         [phone] ________________________________  Name: Carolyn Holland MRN: [id number]  Date: 12/04/2021  DOB: 1971-02-26  Follow-Up Visit Note by telephone.  The patient opted for telemedicine to maximize safety during the pandemic.  MyChart video was not obtainable.  Outpatient  CC: [facility]  [employer]  Diagnosis and Prior Radiotherapy:    ICD-10-CM   1. Intracranial meningioma ([place])  D32.0       CHIEF COMPLAINT: Here for follow-up and surveillance of tumor  Narrative:  The patient returns today for routine follow-up.  Doing well. No new neurologic symptoms except some b/l blurred vision at times, and she has been coping with low glucose levels which may be related to this. No new HAs or dizziness.    She is pleased with how she has been doing w/ exception to glucose issues.                    ALLERGIES:  is allergic to penicillins.  Meds: Current Outpatient Medications  Medication Sig Dispense Refill   ALPRAZolam (XANAX) 0.5 MG tablet alprazolam 0.5 mg tablet  TAKE 1 TABLET BY MOUTH THREE TIMES DAILY AS NEEDED FOR ANXIETY     aspirin 325 MG tablet 1 tablet     chlorthalidone (HYGROTON) 25 MG tablet Take 25 mg by mouth daily at 2 PM.     Continuous Blood Gluc Sensor (FREESTYLE LIBRE 2 SENSOR) MISC See admin instructions.     diclofenac sodium (VOLTAREN) 1 % GEL Apply 1-2 g topically 4 (four) times daily as needed for pain.     gabapentin (NEURONTIN) 300 MG capsule Take 300 mg by mouth 3 (three) times daily. (Patient not taking: Reported on 05/29/2021)     glimepiride (AMARYL) 2 MG tablet Take 2 mg by mouth 2 (two) times daily as needed (elevated sugar level).     JARDIANCE 25 MG TABS tablet Take 25 mg by mouth daily.     lidocaine-prilocaine (EMLA) cream Apply 1 application topically as needed. 30 g 0   LORazepam (ATIVAN) 1 MG tablet Take 1-2 tablets (1-2 mg total) by mouth as directed. Take prior to MRI and /or  CT scan and /or radiation treatments. 16 tablet 0   metFORMIN (GLUCOPHAGE-XR) 500 MG 24 hr tablet Take 500 mg by mouth daily.     Multiple Vitamins-Minerals (MULTI FOR HER) TABS See admin instructions.     omeprazole (PRILOSEC) 40 MG capsule Take 40 mg by mouth daily as needed (acid reflux/indigestion.).     ondansetron (ZOFRAN) 8 MG tablet Take 1 tablet (8 mg total) by mouth every 8 (eight) hours as needed. 30 tablet 0   potassium chloride SA (KLOR-CON) 20 MEQ tablet Take 20 mEq by mouth 2 (two) times daily.     prochlorperazine (COMPAZINE) 10 MG tablet Take 1 tablet (10 mg total) by mouth every 6 (six) hours as needed for nausea or vomiting. 30 tablet 0   rosuvastatin (CRESTOR) 20 MG tablet Take 20 mg by mouth daily.      TRULICITY 2.59 DG/3.8VF SOPN Inject 0.75 mg into the skin once a week. Thursday     No current facility-administered medications for this encounter.    Physical Findings: The patient is in no acute distress. Patient is alert and oriented.  vitals were not taken for this visit. .      Lab Findings: Lab Results  Component Value Date   WBC 7.7 10/08/2021  HGB 15.3 (H) 10/08/2021   HCT 47.3 (H) 10/08/2021   MCV 85.1 10/08/2021   PLT 293 10/08/2021    Radiographic Findings: No results found.  Impression/Plan:  Healing well from [facility] to meningioma.  Advised her to f/u with her doctor managing her glucose issues.  Mont Dutton will arrange MRI brain in 70mo and pt will follow in Dr. Renda Rolls clinic for surveillance. I explained Dr. Renda Rolls role in our brain tumor program and she is pleased with this plan. I will see her PRN.  This encounter was provided by telemedicine platform; patient desired telemedicine during pandemic precautions.  MyChart Video was not available; phone was used. The patient has given verbal consent for this type of encounter and has been advised to only accept a meeting of this type in a secure network environment. On date of service, in  total, I spent 15 minutes on this encounter.   The attendants for this meeting include Eppie Gibson  and Guerry Minors During the encounter, Eppie Gibson was located at [facility] Radiation Oncology Department.  Lynita Lombard Jonas was located at home.     _____________________________________   Eppie Gibson, MD

## 2021-12-17 ENCOUNTER — Telehealth: Payer: Self-pay

## 2021-12-17 ENCOUNTER — Emergency Department (HOSPITAL_COMMUNITY)
Admission: EM | Admit: 2021-12-17 | Discharge: 2021-12-17 | Disposition: A | Discharge status: Home / Self Care | Payer: 59 | Attending: Emergency Medicine | Admitting: Emergency Medicine

## 2021-12-17 ENCOUNTER — Emergency Department (HOSPITAL_COMMUNITY): Payer: 59

## 2021-12-17 ENCOUNTER — Other Ambulatory Visit: Payer: Self-pay

## 2021-12-17 ENCOUNTER — Encounter (HOSPITAL_COMMUNITY): Payer: Self-pay

## 2021-12-17 DIAGNOSIS — R519 Headache, unspecified: Secondary | ICD-10-CM | POA: Diagnosis present

## 2021-12-17 DIAGNOSIS — I10 Essential (primary) hypertension: Secondary | ICD-10-CM | POA: Insufficient documentation

## 2021-12-17 DIAGNOSIS — Z85841 Personal history of malignant neoplasm of brain: Secondary | ICD-10-CM | POA: Diagnosis not present

## 2021-12-17 DIAGNOSIS — Z7982 Long term (current) use of aspirin: Secondary | ICD-10-CM | POA: Diagnosis not present

## 2021-12-17 DIAGNOSIS — R0789 Other chest pain: Secondary | ICD-10-CM | POA: Insufficient documentation

## 2021-12-17 DIAGNOSIS — R0602 Shortness of breath: Secondary | ICD-10-CM | POA: Diagnosis not present

## 2021-12-17 DIAGNOSIS — R42 Dizziness and giddiness: Secondary | ICD-10-CM | POA: Insufficient documentation

## 2021-12-17 DIAGNOSIS — M792 Neuralgia and neuritis, unspecified: Secondary | ICD-10-CM | POA: Diagnosis not present

## 2021-12-17 DIAGNOSIS — Z7984 Long term (current) use of oral hypoglycemic drugs: Secondary | ICD-10-CM | POA: Insufficient documentation

## 2021-12-17 DIAGNOSIS — E119 Type 2 diabetes mellitus without complications: Secondary | ICD-10-CM | POA: Insufficient documentation

## 2021-12-17 DIAGNOSIS — Z79899 Other long term (current) drug therapy: Secondary | ICD-10-CM | POA: Diagnosis not present

## 2021-12-17 DIAGNOSIS — Z955 Presence of coronary angioplasty implant and graft: Secondary | ICD-10-CM | POA: Insufficient documentation

## 2021-12-17 LAB — BASIC METABOLIC PANEL
Anion gap: 7 (ref 5–15)
BUN: 17 mg/dL (ref 6–20)
CO2: 30 mmol/L (ref 22–32)
Calcium: 9.9 mg/dL (ref 8.9–10.3)
Chloride: 101 mmol/L (ref 98–111)
Creatinine, Ser: 0.74 mg/dL (ref 0.44–1.00)
GFR, Estimated: >60 mL/min (ref >60)
Glucose, Bld: 179 mg/dL — ABNORMAL HIGH (ref 70–99)
Potassium: 3.5 mmol/L (ref 3.5–5.1)
Sodium: 138 mmol/L (ref 135–145)

## 2021-12-17 LAB — CBC
HCT: 50.8 % — ABNORMAL HIGH (ref 36.0–46.0)
Hemoglobin: 16.9 g/dL — ABNORMAL HIGH (ref 12.0–15.0)
MCH: 27.9 pg (ref 26.0–34.0)
MCHC: 33.3 g/dL (ref 30.0–36.0)
MCV: 84 fL (ref 80.0–100.0)
Platelets: 328 10*3/uL (ref 150–400)
RBC: 6.05 MIL/uL — ABNORMAL HIGH (ref 3.87–5.11)
RDW: 13.1 % (ref 11.5–15.5)
WBC: 7.3 10*3/uL (ref 4.0–10.5)
nRBC: 0 % (ref 0.0–0.2)

## 2021-12-17 LAB — TROPONIN I (HIGH SENSITIVITY): Troponin I (High Sensitivity): <2 ng/L (ref <18)

## 2021-12-17 MED ORDER — LORAZEPAM 2 MG/ML IJ SOLN
1.0000 mg | INTRAMUSCULAR | Status: DC | PRN
  Administered 2021-12-17: 1 mg via INTRAVENOUS
  Filled 2021-12-17: qty 1

## 2021-12-17 MED ORDER — FAMOTIDINE 20 MG PO TABS
20.0000 mg | ORAL_TABLET | Freq: Once | ORAL | Status: AC
Start: 2021-12-17 — End: 2021-12-17
  Administered 2021-12-17: 20 mg via ORAL
  Filled 2021-12-17: qty 1

## 2021-12-17 MED ORDER — GADOBUTROL 1 MMOL/ML IV SOLN
10.0000 mL | Freq: Once | INTRAVENOUS | Status: AC | PRN
  Administered 2021-12-17: 10 mL via INTRAVENOUS

## 2021-12-17 MED ORDER — PANTOPRAZOLE SODIUM 20 MG PO TBEC: 20.0000 mg | DELAYED_RELEASE_TABLET | Freq: Every day | ORAL | 0 refills | Status: DC

## 2021-12-17 NOTE — ED Notes (Signed)
Patient coming in with a wide range of complains. History of meningeoma, c/o face burning and tingling Also so chest pain, possibly diabetes, weakness when getting up.

## 2021-12-17 NOTE — ED Provider Notes (Signed)
[facility] DEPT Provider Note   CSN: [id number] Arrival date & time: 12/17/21  1137     History  Chief Complaint  Patient presents with   Shortness of Breath   Dizziness   Headache   Chest Pain   lethargic    Carolyn Holland is a 51 y.o. female.  The history is provided by the patient and medical records. No language interpreter was used.  Shortness of Breath Associated symptoms: chest pain and headaches   Dizziness Associated symptoms: chest pain, headaches and shortness of breath   Headache Associated symptoms: dizziness   Chest Pain Associated symptoms: dizziness, headache and shortness of breath    51 year old female with significant history including diabetes, hypertension, CABG, brain tumor, who presents with multiple complaints.  Patient report she has history of meningioma in which she has surgical removal a few years back, it can back and in December of last year she did have multiple radiation therapy.  For the past 4 days she endorsed having some discomfort to the left side of her head near the site of the meningioma.  She also complaining of occasional tingling sensation to the corner of her lips on the right side, having burning sensation in her left arm, follows with chest discomfort, and discomfort to her left posterior knee.  She also endorsed feeling dizzy in the morning that may last for several hours.  She has history of heartburn and felt that her chest discomfort is related to that.  She endorsed feeling very anxious with worries that her cancer may return.  She did attempt to reach out to her oncologist and was recommended to come to the ER for further assessment.  Home Medications Prior to Admission medications   Medication Sig Start Date End Date Taking? Authorizing Provider  ALPRAZolam (XANAX) 0.5 MG tablet alprazolam 0.5 mg tablet  TAKE 1 TABLET BY MOUTH THREE TIMES DAILY AS NEEDED FOR ANXIETY 10/13/20   [provider]  aspirin 325 MG tablet 1 tablet    [provider]  chlorthalidone (HYGROTON) 25 MG tablet Take 25 mg by mouth daily at 2 PM.    [provider]  Continuous Blood Gluc Sensor (FREESTYLE LIBRE 2 SENSOR) MISC See admin instructions. 06/20/20   [provider]  diclofenac sodium (VOLTAREN) 1 % GEL Apply 1-2 g topically 4 (four) times daily as needed for pain. 05/12/19   [provider]  gabapentin (NEURONTIN) 300 MG capsule Take 300 mg by mouth 3 (three) times daily. Patient not taking: Reported on 05/29/2021 09/22/20   [provider]  glimepiride (AMARYL) 2 MG tablet Take 2 mg by mouth 2 (two) times daily as needed (elevated sugar level). 07/17/20   [provider]  JARDIANCE 25 MG TABS tablet Take 25 mg by mouth daily. 10/02/20   [provider]  lidocaine-prilocaine (EMLA) cream Apply 1 application topically as needed. 06/22/21   Orson Slick, MD  LORazepam (ATIVAN) 1 MG tablet Take 1-2 tablets (1-2 mg total) by mouth as directed. Take prior to MRI and /or CT scan and /or radiation treatments. 09/28/21   Eppie Gibson, MD  metFORMIN (GLUCOPHAGE-XR) 500 MG 24 hr tablet Take 500 mg by mouth daily. 02/21/19   [provider]  Multiple Vitamins-Minerals (MULTI FOR HER) TABS See admin instructions.    [provider]  omeprazole (PRILOSEC) 40 MG capsule Take 40 mg by mouth daily as needed (acid reflux/indigestion.).    [provider]  ondansetron (ZOFRAN) 8 MG tablet Take 1 tablet (8 mg total) by mouth every 8 (eight) hours as needed. 06/22/21   Orson Slick, MD  potassium chloride SA (KLOR-CON) 20 MEQ tablet Take 20 mEq by mouth 2 (two) times daily. 07/30/20   [provider]  prochlorperazine (COMPAZINE) 10 MG tablet Take 1 tablet (10 mg total) by mouth every 6 (six) hours as needed for nausea or vomiting. 06/22/21   Orson Slick, MD  rosuvastatin (CRESTOR) 20 MG tablet Take 20 mg by mouth  daily.     [provider]  TRULICITY 0.26 VZ/8.5YI SOPN Inject 0.75 mg into the skin once a week. Thursday 08/27/20   [provider]      Allergies    Penicillins    Review of Systems   Review of Systems  Respiratory:  Positive for shortness of breath.   Cardiovascular:  Positive for chest pain.  Neurological:  Positive for dizziness and headaches.  All other systems reviewed and are negative.  Physical Exam Updated Vital Signs BP 119/89 (BP Location: Left Arm)    Pulse 80    Temp 98.1 F (36.7 C) (Oral)    Resp 16    Ht 5\' 6"  (1.676 m)    Wt 108.9 kg    SpO2 100%    BMI 38.74 kg/m  Physical Exam Vitals and nursing note reviewed.  Constitutional:      General: She is not in acute distress.    Appearance: She is well-developed.  HENT:     Head: Atraumatic.  Eyes:     Extraocular Movements: Extraocular movements intact.     Conjunctiva/sclera: Conjunctivae normal.     Pupils: Pupils are equal, round, and reactive to light.  Cardiovascular:     Rate and Rhythm: Normal rate and regular rhythm.     Pulses: Normal pulses.  Pulmonary:     Effort: Pulmonary effort is normal.  Chest:     Chest wall: No tenderness.  Abdominal:     Palpations: Abdomen is soft.     Tenderness: There is no abdominal tenderness.  Musculoskeletal:     Cervical back: Neck supple.  Skin:    Findings: No rash.  Neurological:     Mental Status: She is alert and oriented to person, place, and time.     GCS: GCS eye subscore is 4. GCS verbal subscore is 5. GCS motor subscore is 6.     Comments: Neurologic exam:  Speech clear, pupils equal round reactive to light, extraocular movements intact  Normal peripheral visual fields Cranial nerves III through XII normal including no facial droop Follows commands, moves all extremities x4, normal strength to bilateral upper and lower extremities at all major muscle groups including grip Sensation normal to light touch  Coordination intact,  no limb ataxia, finger-nose-finger normal Rapid alternating movements normal No pronator drift Gait normal   Psychiatric:        Mood and Affect: Mood normal.    ED Results / Procedures / Treatments   Labs (all labs ordered are listed, but only abnormal results are displayed) Labs Reviewed  BASIC METABOLIC PANEL - Abnormal; Notable for the following components:      Result Value   Glucose, Bld 179 (*)    All other components within normal limits  CBC - Abnormal; Notable for the following components:   RBC 6.05 (*)    Hemoglobin 16.9 (*)    HCT 50.8 (*)    All other  components within normal limits  TROPONIN I (HIGH SENSITIVITY)    EKG EKG Interpretation  Date/Time:  Thursday December 17 2021 11:51:40 EST Ventricular Rate:  80 PR Interval:  171 QRS Duration: 89 QT Interval:  364 QTC Calculation: 420 R Axis:   19 Text Interpretation: Sinus rhythm Probable left atrial enlargement Probable left ventricular hypertrophy No significant change since last tracing Confirmed by Fredia Sorrow [id number]) on 12/17/2021 7:29:28 PM ED ECG REPORT   Date: 12/17/2021  Rate: 80  Rhythm: normal sinus rhythm  QRS Axis: normal  Intervals: normal  ST/T Wave abnormalities: normal  Conduction Disutrbances:none  Narrative Interpretation: left LVH  Old EKG Reviewed: unchanged  I have personally reviewed the EKG tracing and agree with the computerized printout as noted.   Radiology DG Chest 2 View  Result Date: 12/17/2021 CLINICAL DATA:  chest pain and SOB EXAM: CHEST - 2 VIEW COMPARISON:  06/02/2021 FINDINGS: The heart size and mediastinal contours are within normal limits. Both lungs are clear. The visualized skeletal structures are unremarkable. IMPRESSION: No active cardiopulmonary disease. Electronically Signed   By: Davina Poke D.O.   On: 12/17/2021 13:37   MR BRAIN W WO CONTRAST  Result Date: 12/17/2021 CLINICAL DATA:  Headache.  History of meningioma resection. EXAM: MRI HEAD  WITHOUT AND WITH CONTRAST TECHNIQUE: Multiplanar, multiecho pulse sequences of the brain and surrounding structures were obtained without and with intravenous contrast. CONTRAST:  12mL GADAVIST GADOBUTROL 1 MMOL/ML IV SOLN COMPARISON:  Head MRI 10/07/2021 FINDINGS: Brain: There is no evidence of an acute infarct, intracranial hemorrhage, midline shift, or extra-axial fluid collection. The ventricles and sulci are normal. The cerebellar tonsils are normally positioned. An enlarged partially empty sella is unchanged. Homogeneously enhancing residual meningioma along the inferior surface of the anterior tentorial leaflet on the left measures approximately 21 x 6 mm on axial imaging, not significantly changed upon remeasurement of the prior study and with unchanged mild mass effect on the pons. Extension into left Meckel's cave is also unchanged. Vascular: Major intracranial vascular flow voids are preserved. Skull and upper cervical spine: Unremarkable bone marrow signal. Sinuses/Orbits: Unremarkable orbits. Paranasal sinuses and mastoid air cells are clear. Other: None. IMPRESSION: 1. Unchanged residual meningioma along the left anterior tentorial leaflet extending into Meckel's cave. 2. No acute intracranial abnormality. Electronically Signed   By: Logan Bores M.D.   On: 12/17/2021 18:45    Procedures Procedures    Medications Ordered in ED Medications  LORazepam (ATIVAN) injection 1 mg (1 mg Intravenous Given 12/17/21 1744)  famotidine (PEPCID) tablet 20 mg (20 mg Oral Given 12/17/21 1744)  gadobutrol (GADAVIST) 1 MMOL/ML injection 10 mL (10 mLs Intravenous Contrast Given 12/17/21 1829)    ED Course/ Medical Decision Making/ A&P                           Medical Decision Making Amount and/or Complexity of Data Reviewed Labs: ordered. Radiology: ordered.  Risk Prescription drug management.   BP 119/89 (BP Location: Left Arm)    Pulse 80    Temp 98.1 F (36.7 C) (Oral)    Resp 16    Ht 5\' 6"   (1.676 m)    Wt 108.9 kg    SpO2 100%    BMI 38.74 kg/m   13:76 PM 51 year old female with history of meningioma status post prior resection, which did return follows with brain radiation a month ago who presents with complaints of headache, and vague facial discomfort.  She is scheduled to have a brain MRI in 4 months and to follow-up with oncologist Dr. Renda Rolls clinic.  At this time she does not have any focal neurodeficit.  I did reach out and spoke with your oncology Dr. Mickeal Skinner who does recommend obtaining brain MRI with contrast since patient is actively having symptoms and thus this will help determine further management.  Will order brain MRI with contrast.  7:37 PM Brain MRI obtained showing no acute changes.  Labs are reassuring.  Cardiac work-up unremarkable.  Low risk of Mace based on hear score.  Low suspicion for stroke or acute stroke.  Recommend outpatient follow-up.  Chest pain likely related to GERD, will prescribe Protonix.  This patient presents to the ED for concern of space occupying lesion, this involves an extensive number of treatment options, and is a complaint that carries with it a high risk of complications and morbidity.  The differential diagnosis includes stroke, ACS, space occupying lesion, electrolyte derangement, anemia  Co morbidities that complicate the patient evaluation rectal cancer, brain meningioma Additional history obtained:  Additional history obtained from prior notes External records from outside source obtained and reviewed including radiation oncologist Dr. Isidore Moos who most recently document on 12/04/21 with recommendation for brain MRI in a few months and to f/u with Dr. Mickeal Skinner  Lab Tests:  I Ordered, and personally interpreted labs.  The pertinent results include:  no significant electrolytes derangement  Imaging Studies ordered:  I ordered imaging studies including brain MRI I independently visualized and interpreted imaging which showed  unchange residual left meningioma I agree with the radiologist interpretation  Cardiac Monitoring:  The patient was maintained on a cardiac monitor.  I personally viewed and interpreted the cardiac monitored which showed an underlying rhythm of: normal sinus rhythm  Medicines ordered and prescription drug management:   Consultations Obtained:  I requested consultation with the neuro oncology,  and discussed lab and imaging findings as well as pertinent plan - they recommend: brain MRI   Reevaluation:  After the interventions noted above, I reevaluated the patient and found that they have :stayed the same   Dispostion:  After consideration of the diagnostic results and the patients response to treatment, I feel that the patent would benefit from outpt f/u with her neuro oncologist.         Final Clinical Impression(s) / ED Diagnoses Final diagnoses:  Atypical chest pain  Bad headache  Neuropathic pain    Rx / DC Orders ED Discharge Orders          Ordered    pantoprazole (PROTONIX) 20 MG tablet  Daily        12/17/21 1939              Domenic Moras, PA-C 12/17/21 1941    Fredia Sorrow, MD 12/20/21 1649

## 2021-12-17 NOTE — ED Notes (Addendum)
Patient returns from MRI, in her room eating.

## 2021-12-17 NOTE — Discharge Instructions (Addendum)
You have been evaluated for your symptoms.  Fortunately your brain MRI did not show any concerning changes.  Your labs are reassuring.  Please take Protonix as needed for heartburn.  Call and follow-up closely with your specialist for further managements of your condition.  Return if you have any concern

## 2021-12-17 NOTE — Telephone Encounter (Signed)
Received VM from patient reporting that she's been feeling very dizzy and weak for the past several days. She states the symptoms are worse in the mornings, and don't usually resolve until 2-3pm in the afternoon. She also reports a persistent headache to the left side of her head. Requests return call to see if Dr. Isidore Moos can see her today or if she should proceed to ED.   Since Dr. Isidore Moos is out of the office today, relayed patient's symptoms to other colleagues in the department. Given that patient wasn't experiencing these symptoms when she had her 1 month F/U call with Dr. Isidore Moos, and that she has other comorbidities, was advised to direct patient to ED for evaluation and possible imaging (if deemed warranted)  Returned patient's call and passed along advice. Patient verbalized understanding and agreement of recommendations. No other needs identified at this time, but patient was encouraged to call me back directly should she have any other non-urgent questions/concerns

## 2021-12-17 NOTE — ED Provider Triage Note (Signed)
Emergency Medicine Provider Triage Evaluation Note  Carolyn Holland , a 51 y.o. female  was evaluated in triage.  Pt complains of shortness of breath, nausea, headache.  Patient has a prior history of meningioma, followed by Dr. Isidore Holland, sent to the ED today as she has a left temporal headache that is been ongoing for the past 3 days.  Also reports a hard time with deep inspiration.  Received radiation treatment at the end of January.  Non-smoker, no blood clots in the past.  Review of Systems  Positive: Headache, SOB, nausea Negative: Fever, trauma, neck pain  Physical Exam  BP (!) 134/92 (BP Location: Left Arm)    Pulse 86    Temp 98.1 F (36.7 C) (Oral)    Resp 17    Ht 5\' 6"  (1.676 m)    Wt 108.9 kg    SpO2 96%    BMI 38.74 kg/m  Gen:   Awake, no distress   Resp:  Normal effort  MSK:   Moves extremities without difficulty  Other:    Medical Decision Making  Medically screening exam initiated at 12:15 PM.  Appropriate orders placed.  Carolyn Holland was informed that the remainder of the evaluation will be completed by another provider, this initial triage assessment does not replace that evaluation, and the importance of remaining in the ED until their evaluation is complete.  Patient here with headache, along with upper respiratory symptoms.  Labs have been ordered.  But states that if she gets MRI today she will need Ativan.   Carolyn Fitting, PA-C 12/17/21 1219

## 2021-12-17 NOTE — ED Triage Notes (Signed)
Patient c/o intermittent upper abdomen and lower mid chest pain x 4 days. Patient also c/o SOB, nausea, and dizziness.  Patient reports that she has left leg pain and swelling x 4 days as well.

## 2021-12-17 NOTE — ED Notes (Signed)
Patient transported to MRI 

## 2021-12-17 NOTE — ED Notes (Signed)
PIV placed, awaiting call to MRI

## 2021-12-22 ENCOUNTER — Telehealth: Payer: Self-pay | Admitting: Internal Medicine

## 2021-12-22 NOTE — Telephone Encounter (Signed)
Scheduled appt per 1/31 staff msg. Pt is aware of appt date and time. She is aware to arrive 15 mins prior to appt time.

## 2021-12-28 ENCOUNTER — Inpatient Hospital Stay: Payer: 59 | Attending: Hematology and Oncology | Admitting: Internal Medicine

## 2021-12-31 ENCOUNTER — Telehealth: Payer: Self-pay | Admitting: *Deleted

## 2021-12-31 NOTE — Telephone Encounter (Signed)
Attempted to reach the patient to reschedule missed appointment.  No answer, left message to have patient return call.

## 2022-01-06 ENCOUNTER — Encounter: Payer: Self-pay | Admitting: Radiation Therapy

## 2022-01-06 NOTE — Progress Notes (Signed)
I have not been able to reach Ms. Polus on the phone, so I have mailed out an appointment reminder including my contact information for her 2/27 appointment with Dr. Mickeal Skinner.   Mont Dutton R.T.(R)(T) Radiation Special Procedures Navigator

## 2022-01-18 ENCOUNTER — Inpatient Hospital Stay: Payer: 59 | Admitting: Internal Medicine

## 2022-01-18 ENCOUNTER — Other Ambulatory Visit: Payer: Self-pay | Admitting: Internal Medicine

## 2022-01-18 DIAGNOSIS — D329 Benign neoplasm of meninges, unspecified: Secondary | ICD-10-CM

## 2022-01-19 ENCOUNTER — Other Ambulatory Visit: Payer: Self-pay | Admitting: Radiation Therapy

## 2022-01-19 ENCOUNTER — Telehealth: Payer: Self-pay | Admitting: Internal Medicine

## 2022-01-19 NOTE — Telephone Encounter (Signed)
R/s pt's new pt appt with Dr. Mickeal Skinner per 2/27 staff msg from [facility]. Pt is aware of new appt date and time.

## 2022-04-14 ENCOUNTER — Emergency Department (HOSPITAL_BASED_OUTPATIENT_CLINIC_OR_DEPARTMENT_OTHER)
Admission: EM | Admit: 2022-04-14 | Discharge: 2022-04-15 | Disposition: A | Discharge status: Home / Self Care | Payer: 59 | Attending: Emergency Medicine | Admitting: Emergency Medicine

## 2022-04-14 ENCOUNTER — Emergency Department (HOSPITAL_BASED_OUTPATIENT_CLINIC_OR_DEPARTMENT_OTHER): Payer: 59 | Admitting: Radiology

## 2022-04-14 ENCOUNTER — Other Ambulatory Visit: Payer: Self-pay

## 2022-04-14 DIAGNOSIS — I10 Essential (primary) hypertension: Secondary | ICD-10-CM | POA: Diagnosis not present

## 2022-04-14 DIAGNOSIS — I872 Venous insufficiency (chronic) (peripheral): Secondary | ICD-10-CM | POA: Diagnosis not present

## 2022-04-14 DIAGNOSIS — E119 Type 2 diabetes mellitus without complications: Secondary | ICD-10-CM | POA: Diagnosis not present

## 2022-04-14 DIAGNOSIS — Z7982 Long term (current) use of aspirin: Secondary | ICD-10-CM | POA: Diagnosis not present

## 2022-04-14 DIAGNOSIS — R600 Localized edema: Secondary | ICD-10-CM | POA: Insufficient documentation

## 2022-04-14 DIAGNOSIS — Z79899 Other long term (current) drug therapy: Secondary | ICD-10-CM | POA: Diagnosis not present

## 2022-04-14 DIAGNOSIS — Z7984 Long term (current) use of oral hypoglycemic drugs: Secondary | ICD-10-CM | POA: Diagnosis not present

## 2022-04-14 LAB — BASIC METABOLIC PANEL
Anion gap: 11 (ref 5–15)
BUN: 17 mg/dL (ref 6–20)
CO2: 29 mmol/L (ref 22–32)
Calcium: 10.3 mg/dL (ref 8.9–10.3)
Chloride: 98 mmol/L (ref 98–111)
Creatinine, Ser: 0.9 mg/dL (ref 0.44–1.00)
GFR, Estimated: >60 mL/min (ref >60)
Glucose, Bld: 135 mg/dL — ABNORMAL HIGH (ref 70–99)
Potassium: 3.4 mmol/L — ABNORMAL LOW (ref 3.5–5.1)
Sodium: 138 mmol/L (ref 135–145)

## 2022-04-14 LAB — CBC
HCT: 52.3 % — ABNORMAL HIGH (ref 36.0–46.0)
Hemoglobin: 17 g/dL — ABNORMAL HIGH (ref 12.0–15.0)
MCH: 27.7 pg (ref 26.0–34.0)
MCHC: 32.5 g/dL (ref 30.0–36.0)
MCV: 85.2 fL (ref 80.0–100.0)
Platelets: 244 10*3/uL (ref 150–400)
RBC: 6.14 MIL/uL — ABNORMAL HIGH (ref 3.87–5.11)
RDW: 13.3 % (ref 11.5–15.5)
WBC: 7.4 10*3/uL (ref 4.0–10.5)
nRBC: 0 % (ref 0.0–0.2)

## 2022-04-14 LAB — TROPONIN I (HIGH SENSITIVITY): Troponin I (High Sensitivity): 2 ng/L (ref <18)

## 2022-04-14 LAB — PREGNANCY, URINE: Preg Test, Ur: NEGATIVE

## 2022-04-14 NOTE — ED Triage Notes (Signed)
Pt arrived POV c/o bilateral leg swelling x 1 month. Pt reports a sudden sharp pain to left arm, radiated to left neck, to left side of chest today, pain disappeared within few seconds. Denies SOB, no fever.

## 2022-04-15 ENCOUNTER — Emergency Department (HOSPITAL_BASED_OUTPATIENT_CLINIC_OR_DEPARTMENT_OTHER): Payer: 59

## 2022-04-15 LAB — TROPONIN I (HIGH SENSITIVITY): Troponin I (High Sensitivity): 2 ng/L (ref <18)

## 2022-04-15 LAB — BRAIN NATRIURETIC PEPTIDE: B Natriuretic Peptide: 6 pg/mL (ref 0.0–100.0)

## 2022-04-15 NOTE — ED Provider Notes (Signed)
[facility] Provider Note: Carolyn Spurling, MD, FACEP  CSN: [id number] MRN: [id number] ARRIVAL: 04/14/22 at [place]: [place]  Leg Swelling   HISTORY OF PRESENT ILLNESS  04/15/22 12:06 AM Carolyn Holland is a 51 y.o. female with bilateral lower extremity pain and swelling for about a month.  The pain is primarily in her bilateral popliteal fossae radiating down her anterior lower legs.  She rates her pain as a 9 out of 10 and is worse with ambulation making ambulation difficult.  She has gotten some relief with Tylenol and naproxen but their efficacy has waned.  She is now applying over-the-counter lidocaine patches to her knees with some relief.  She had surgery for colon cancer in August of last year which she states was curative.  Since her surgery she has had chronic edema of her abdominal wall.  Yesterday she had 2 brief episodes of a sharp pain in her left arm that radiated to the left side of her neck and the left side of her chest.  These pains only last a couple of seconds and she has had no further episodes.  She is having no shortness of breath, current chest pain, cough or fever.   Past Medical History:  Diagnosis Date   Anemia 12/22/2012   Anxiety 2012   Arthritis    RA   Bilateral anterior knee pain 2012   Brain tumor ([place])    Diabetes mellitus without complication ([facility])    GERD (gastroesophageal reflux disease) 2000   come and go   Headache    As a child   Hyperlipidemia    Hypertension 2008   Panic attack 2012   situational related to move from [place].     Past Surgical History:  Procedure Laterality Date   APPLICATION OF CRANIAL NAVIGATION N/A 07/02/2019   Procedure: APPLICATION OF CRANIAL NAVIGATION;  Surgeon: Judith Part, MD;  Location: [facility];  Service: Neurosurgery;  Laterality: N/A;   CESAREAN SECTION  11/22/2000   MENISCUS REPAIR Left 11/22/2009   L knee for bilateral meniscal tear. Following acute injury.     PARTIAL HYSTERECTOMY  11/23/2007   RETROSIGMOID CRANIECTOMY FOR TUMOR RESECTION Left 07/02/2019   Procedure: Left Retrosigmoid craniotomy for tumor resection;  Surgeon: Judith Part, MD;  Location: [facility];  Service: Neurosurgery;  Laterality: Left;    Family History  Problem Relation Age of Onset   Diabetes Mother    Hypertension Mother    Heart disease Mother 20   Stroke Mother 14       x 2   Alcohol abuse Mother        previous    Drug abuse Mother        previous cocaine    Heart attack Mother    Hypertension Father    Colon cancer Neg Hx    Esophageal cancer Neg Hx    Stomach cancer Neg Hx    Rectal cancer Neg Hx     Social History   Tobacco Use   Smoking status: Never   Smokeless tobacco: Never  Vaping Use   Vaping Use: Never used  Substance Use Topics   Alcohol use: Yes    Comment: occassional, social, wine   Drug use: No    Prior to Admission medications   Medication Sig Start Date End Date Taking? Authorizing Provider  ALPRAZolam Duanne Moron) 0.5 MG tablet Take 0.5 mg by mouth 3 (three) times daily as needed for anxiety. 10/13/20  [provider]  aspirin 325 MG tablet Take 325 mg by mouth daily.    [provider]  Continuous Blood Gluc Sensor (FREESTYLE LIBRE 2 SENSOR) MISC See admin instructions. 06/20/20   [provider]  diclofenac sodium (VOLTAREN) 1 % GEL Apply 2 g topically 4 (four) times daily as needed for pain. 05/12/19   [provider]  gabapentin (NEURONTIN) 300 MG capsule Take 300 mg by mouth 3 (three) times daily. 09/22/20   [provider]  glimepiride (AMARYL) 2 MG tablet Take 2 mg by mouth 2 (two) times daily as needed (elevated sugar level). 07/17/20   [provider]  JARDIANCE 25 MG TABS tablet Take 25 mg by mouth daily. 10/02/20   [provider]  lidocaine-prilocaine (EMLA) cream Apply 1 application topically as needed. 06/22/21   Orson Slick, MD  LORazepam (ATIVAN) 1 MG  tablet Take 1-2 tablets (1-2 mg total) by mouth as directed. Take prior to MRI and /or CT scan and /or radiation treatments. 09/28/21   Eppie Gibson, MD  metFORMIN (GLUCOPHAGE-XR) 500 MG 24 hr tablet Take 500 mg by mouth daily. 02/21/19   [provider]  Multiple Vitamins-Minerals (MULTI FOR HER) TABS See admin instructions.    [provider]  omeprazole (PRILOSEC) 40 MG capsule Take 40 mg by mouth daily as needed (acid reflux/indigestion.).    [provider]  ondansetron (ZOFRAN) 8 MG tablet Take 1 tablet (8 mg total) by mouth every 8 (eight) hours as needed. 06/22/21   Orson Slick, MD  pantoprazole (PROTONIX) 20 MG tablet Take 1 tablet (20 mg total) by mouth daily. 12/17/21   Domenic Moras, PA-C  potassium chloride SA (KLOR-CON) 20 MEQ tablet Take 20 mEq by mouth 2 (two) times daily. 07/30/20   [provider]  prochlorperazine (COMPAZINE) 10 MG tablet Take 1 tablet (10 mg total) by mouth every 6 (six) hours as needed for nausea or vomiting. 06/22/21   Orson Slick, MD  rosuvastatin (CRESTOR) 20 MG tablet Take 20 mg by mouth daily.     [provider]  TRULICITY 6.30 ZS/0.1UX SOPN Inject 0.75 mg into the skin once a week. Thursday 08/27/20   [provider]    Allergies Penicillins   REVIEW OF SYSTEMS  Negative except as noted here or in the History of Present Illness.   PHYSICAL EXAMINATION  Initial Vital Signs Blood pressure (!) 138/118, pulse 91, temperature (!) 97.5 F (36.4 C), resp. rate 19, height '5\' 6"'$  (1.676 m), weight 110.2 kg, SpO2 99 %.  Examination General: Well-developed, well-nourished female in no acute distress; appearance consistent with age of record HENT: normocephalic; atraumatic Eyes: Normal appearance Neck: supple Heart: regular rate and rhythm Lungs: clear to auscultation bilaterally Abdomen: soft; mildly distended; nontender; bowel sounds present Extremities: No deformity; mild edema and tenderness of  lower legs with multiple varicosities, no erythema or warmth Neurologic: Awake, alert and oriented; motor function intact in all extremities and symmetric; no facial droop Skin: Warm and dry Psychiatric: Normal mood and affect   RESULTS  Summary of this visit's results, reviewed and interpreted by myself:   EKG Interpretation  Date/Time:  Wednesday Apr 14 2022 18:29:12 EDT Ventricular Rate:  90 PR Interval:  164 QRS Duration: 86 QT Interval:  372 QTC Calculation: 455 R Axis:   57 Text Interpretation: Normal sinus rhythm Normal ECG No significant change was found Confirmed by Kaleen Rochette [phone]) on 04/15/2022 12:22:43 AM  Laboratory Studies: Results for orders placed or performed during the hospital encounter of 04/14/22 (from the past 24 hour(s))  Basic metabolic panel     Status: Abnormal   Collection Time: 04/14/22  9:08 PM  Result Value Ref Range   Sodium 138 135 - 145 mmol/L   Potassium 3.4 (L) 3.5 - 5.1 mmol/L   Chloride 98 98 - 111 mmol/L   CO2 29 22 - 32 mmol/L   Glucose, Bld 135 (H) 70 - 99 mg/dL   BUN 17 6 - 20 mg/dL   Creatinine, Ser 0.90 0.44 - 1.00 mg/dL   Calcium 10.3 8.9 - 10.3 mg/dL   GFR, Estimated >60 >60 mL/min   Anion gap 11 5 - 15  CBC     Status: Abnormal   Collection Time: 04/14/22  9:08 PM  Result Value Ref Range   WBC 7.4 4.0 - 10.5 K/uL   RBC 6.14 (H) 3.87 - 5.11 MIL/uL   Hemoglobin 17.0 (H) 12.0 - 15.0 g/dL   HCT 52.3 (H) 36.0 - 46.0 %   MCV 85.2 80.0 - 100.0 fL   MCH 27.7 26.0 - 34.0 pg   MCHC 32.5 30.0 - 36.0 g/dL   RDW 13.3 11.5 - 15.5 %   Platelets 244 150 - 400 K/uL   nRBC 0.0 0.0 - 0.2 %  Troponin I (High Sensitivity)     Status: None   Collection Time: 04/14/22  9:08 PM  Result Value Ref Range   Troponin I (High Sensitivity) 2 <18 ng/L  Pregnancy, urine     Status: None   Collection Time: 04/14/22  9:08 PM  Result Value Ref Range   Preg Test, Ur NEGATIVE NEGATIVE  Troponin I (High Sensitivity)     Status: None    Collection Time: 04/15/22 12:34 AM  Result Value Ref Range   Troponin I (High Sensitivity) 2 <18 ng/L  Brain natriuretic peptide     Status: None   Collection Time: 04/15/22 12:34 AM  Result Value Ref Range   B Natriuretic Peptide 6.0 0.0 - 100.0 pg/mL   Imaging Studies: DG Chest 2 View  Result Date: 04/14/2022 CLINICAL DATA:  Chest pain EXAM: CHEST - 2 VIEW COMPARISON:  12/17/2021 FINDINGS: The heart size and mediastinal contours are within normal limits. Both lungs are clear. The visualized skeletal structures are unremarkable. IMPRESSION: No active cardiopulmonary disease. Electronically Signed   By: Elmer Picker M.D.   On: 04/14/2022 20:57   US Venous Img Lower Bilateral (DVT)  Result Date: 04/15/2022 CLINICAL DATA:  17915.  Lower extremity swelling EXAM: BILATERAL LOWER EXTREMITY VENOUS DOPPLER ULTRASOUND TECHNIQUE: Gray-scale sonography with compression, as well as color and duplex ultrasound, were performed to evaluate the deep venous system(s) from the level of the common femoral vein through the popliteal and proximal calf veins. COMPARISON:  None Available. FINDINGS: VENOUS Normal compressibility of the common femoral, superficial femoral, and popliteal veins, as well as the visualized calf veins. Visualized portions of profunda femoral vein and great saphenous vein unremarkable. No filling defects to suggest DVT on grayscale or color Doppler imaging. Doppler waveforms show normal direction of venous flow, normal respiratory plasticity and response to augmentation. OTHER None. Limitations: none IMPRESSION: Negative. Electronically Signed   By: Fidela Salisbury M.D.   On: 04/15/2022 01:59    ED COURSE and MDM  Nursing notes, initial and subsequent vitals signs, including pulse oximetry, reviewed and interpreted by myself.  Vitals:   04/14/22 1822 04/14/22 1829 04/14/22 2108 04/15/22 0045  BP: 121/81  (!) 138/118 (!) 112/46  Pulse: 79  91 95  Resp: '16  19 18  '$ Temp: 97.8 F (36.6  C)  (!) 97.5 F (36.4 C)   SpO2: 98%  99% 97%  Weight:  110.2 kg    Height:  '5\' 6"'$  (1.676 m)     Medications - No data to display  12:23 AM We will obtain venous Dopplers of the lower extremities as bilateral DVTs is a possibility, especially in the context of recent cancer.  2:21 AM No evidence of DVTs.  Her D-dimer is normal and I doubt congestive heart failure.  I suspect venous stasis given her prominent varicose veins.  She was advised to continue wearing support hose and follow-up with her PCP.  PROCEDURES  Procedures   ED DIAGNOSES     ICD-10-CM   1. Edema of both lower legs due to peripheral venous insufficiency  I87.2    R60.0          Tzipporah Nagorski, MD 04/15/22 [phone]

## 2022-05-17 ENCOUNTER — Telehealth: Payer: Self-pay | Admitting: *Deleted

## 2022-05-17 ENCOUNTER — Inpatient Hospital Stay: Payer: 59 | Admitting: Internal Medicine

## 2022-05-17 ENCOUNTER — Inpatient Hospital Stay: Payer: 59

## 2022-05-17 NOTE — Telephone Encounter (Signed)
Attempted to reach patient twice to discuss the requirement of having MRI prior to visit with Dr Barbaraann Cao.  Left message with no return call.  Explained that we needed return call to discuss the above prior to rescheduling.

## 2022-05-31 ENCOUNTER — Other Ambulatory Visit: Payer: Self-pay | Admitting: Internal Medicine

## 2022-05-31 DIAGNOSIS — D320 Benign neoplasm of cerebral meninges: Secondary | ICD-10-CM

## 2022-05-31 MED ORDER — LORAZEPAM 1 MG PO TABS: 1.0000 mg | ORAL_TABLET | ORAL | 0 refills | Status: DC

## 2022-06-07 ENCOUNTER — Other Ambulatory Visit: Payer: Self-pay | Admitting: Internal Medicine

## 2022-06-07 ENCOUNTER — Ambulatory Visit (HOSPITAL_COMMUNITY)
Admission: RE | Admit: 2022-06-07 | Discharge: 2022-06-07 | Disposition: A | Discharge status: Home / Self Care | Payer: 59 | Source: Ambulatory Visit | Attending: Internal Medicine | Admitting: Internal Medicine

## 2022-06-07 DIAGNOSIS — D329 Benign neoplasm of meninges, unspecified: Secondary | ICD-10-CM | POA: Diagnosis present

## 2022-06-07 MED ORDER — GADOBUTROL 1 MMOL/ML IV SOLN
10.0000 mL | Freq: Once | INTRAVENOUS | Status: AC | PRN
  Administered 2022-06-07: 10 mL via INTRAVENOUS

## 2022-06-14 ENCOUNTER — Other Ambulatory Visit: Payer: Self-pay

## 2022-06-14 ENCOUNTER — Inpatient Hospital Stay: Payer: 59 | Attending: Internal Medicine

## 2022-06-14 DIAGNOSIS — Z79899 Other long term (current) drug therapy: Secondary | ICD-10-CM | POA: Insufficient documentation

## 2022-06-14 DIAGNOSIS — R519 Headache, unspecified: Secondary | ICD-10-CM | POA: Insufficient documentation

## 2022-06-14 DIAGNOSIS — E119 Type 2 diabetes mellitus without complications: Secondary | ICD-10-CM | POA: Insufficient documentation

## 2022-06-14 DIAGNOSIS — Z7984 Long term (current) use of oral hypoglycemic drugs: Secondary | ICD-10-CM | POA: Insufficient documentation

## 2022-06-14 DIAGNOSIS — Z7985 Long-term (current) use of injectable non-insulin antidiabetic drugs: Secondary | ICD-10-CM | POA: Insufficient documentation

## 2022-06-14 DIAGNOSIS — G629 Polyneuropathy, unspecified: Secondary | ICD-10-CM | POA: Insufficient documentation

## 2022-06-14 DIAGNOSIS — I10 Essential (primary) hypertension: Secondary | ICD-10-CM | POA: Insufficient documentation

## 2022-06-14 DIAGNOSIS — K219 Gastro-esophageal reflux disease without esophagitis: Secondary | ICD-10-CM | POA: Insufficient documentation

## 2022-06-14 DIAGNOSIS — E785 Hyperlipidemia, unspecified: Secondary | ICD-10-CM | POA: Insufficient documentation

## 2022-06-14 DIAGNOSIS — D329 Benign neoplasm of meninges, unspecified: Secondary | ICD-10-CM | POA: Insufficient documentation

## 2022-06-14 DIAGNOSIS — Z7982 Long term (current) use of aspirin: Secondary | ICD-10-CM | POA: Insufficient documentation

## 2022-06-14 DIAGNOSIS — Z923 Personal history of irradiation: Secondary | ICD-10-CM | POA: Insufficient documentation

## 2022-06-15 ENCOUNTER — Other Ambulatory Visit: Payer: Self-pay

## 2022-06-15 ENCOUNTER — Inpatient Hospital Stay (HOSPITAL_BASED_OUTPATIENT_CLINIC_OR_DEPARTMENT_OTHER): Payer: 59 | Admitting: Internal Medicine

## 2022-06-15 VITALS — BP 115/75 | HR 94 | Temp 97.7°F | Resp 20 | Wt 242.8 lb

## 2022-06-15 DIAGNOSIS — K219 Gastro-esophageal reflux disease without esophagitis: Secondary | ICD-10-CM | POA: Diagnosis not present

## 2022-06-15 DIAGNOSIS — Z923 Personal history of irradiation: Secondary | ICD-10-CM | POA: Diagnosis not present

## 2022-06-15 DIAGNOSIS — M792 Neuralgia and neuritis, unspecified: Secondary | ICD-10-CM | POA: Diagnosis not present

## 2022-06-15 DIAGNOSIS — Z79899 Other long term (current) drug therapy: Secondary | ICD-10-CM | POA: Diagnosis not present

## 2022-06-15 DIAGNOSIS — I10 Essential (primary) hypertension: Secondary | ICD-10-CM | POA: Diagnosis not present

## 2022-06-15 DIAGNOSIS — G629 Polyneuropathy, unspecified: Secondary | ICD-10-CM | POA: Diagnosis not present

## 2022-06-15 DIAGNOSIS — Z7982 Long term (current) use of aspirin: Secondary | ICD-10-CM | POA: Diagnosis not present

## 2022-06-15 DIAGNOSIS — R519 Headache, unspecified: Secondary | ICD-10-CM | POA: Diagnosis not present

## 2022-06-15 DIAGNOSIS — D329 Benign neoplasm of meninges, unspecified: Secondary | ICD-10-CM

## 2022-06-15 DIAGNOSIS — E785 Hyperlipidemia, unspecified: Secondary | ICD-10-CM | POA: Diagnosis not present

## 2022-06-15 DIAGNOSIS — Z7984 Long term (current) use of oral hypoglycemic drugs: Secondary | ICD-10-CM | POA: Diagnosis not present

## 2022-06-15 DIAGNOSIS — Z7985 Long-term (current) use of injectable non-insulin antidiabetic drugs: Secondary | ICD-10-CM | POA: Diagnosis not present

## 2022-06-15 DIAGNOSIS — E119 Type 2 diabetes mellitus without complications: Secondary | ICD-10-CM | POA: Diagnosis not present

## 2022-06-15 MED ORDER — PREDNISONE 50 MG PO TABS: 50.0000 mg | ORAL_TABLET | Freq: Every day | ORAL | 0 refills | Status: DC

## 2022-06-15 MED ORDER — AMITRIPTYLINE HCL 50 MG PO TABS: 50.0000 mg | ORAL_TABLET | Freq: Every day | ORAL | 2 refills | Status: DC

## 2022-06-15 NOTE — Progress Notes (Signed)
[facility] at [facility], [place] [address] [phone]   New Patient Evaluation  Date of Service: 06/15/22 Patient Name: Carolyn Holland Patient MRN: [id number] Patient DOB: May 27, 1971 Provider: Ventura Sellers, MD  Identifying Statement:  Carolyn Holland is a 51 y.o. female with  skull base  meningioma who presents for initial consultation and evaluation.    Referring Provider: [facility], [facility],  [place] [id number]  Oncologic History: Oncology History  Rectal cancer ([facility])  06/22/2021 Initial Diagnosis   Rectal cancer ([facility])   07/13/2021 -  Chemotherapy    Patient is on Treatment Plan: COLORECTAL FOLFOX Q14D X 4 MONTHS       CNS Oncologic History 07/02/19: Craniotomy, debulking resection of skull base, meckel's cave meningioma with Dr. Zada Finders.  Path is grade 1 10/30/21: Tumor growth prompts radiosurgery, 25/5 with Dr. Isidore Moos.  History of Present Illness: The patient's records from the referring physician were obtained and reviewed and the patient interviewed to confirm this HPI.  Carolyn Holland presents today for follow up after recent MRI brain.  She completed radiation treatment in December without issue.  Functionally she feels intact, ok.  She has been very limited, however, by pain in her face and head.  For  the past 3 weeks it has been persistent, lasting all day.  She has not been able to work because of the pain.  Stress has been very high with health issues, work stress.  Sleep has been very poor in recent weeks.  Medications: Current Outpatient Medications on File Prior to Visit  Medication Sig Dispense Refill   aspirin 325 MG tablet Take 325 mg by mouth daily.     chlorthalidone (HYGROTON) 25 MG tablet Take 25 mg by mouth daily.     Continuous Blood Gluc Sensor (FREESTYLE LIBRE 2 SENSOR) MISC See admin instructions.     diclofenac sodium (VOLTAREN) 1 % GEL Apply  2 g topically 4 (four) times daily as needed for pain.     gabapentin (NEURONTIN) 300 MG capsule Take 300 mg by mouth 3 (three) times daily.     glimepiride (AMARYL) 2 MG tablet Take 2 mg by mouth 2 (two) times daily as needed (elevated sugar level).     JARDIANCE 25 MG TABS tablet Take 25 mg by mouth daily.     metFORMIN (GLUCOPHAGE-XR) 500 MG 24 hr tablet Take 500 mg by mouth daily.     Multiple Vitamins-Minerals (MULTI FOR HER) TABS See admin instructions.     omeprazole (PRILOSEC) 40 MG capsule Take 40 mg by mouth daily as needed (acid reflux/indigestion.).     potassium chloride SA (KLOR-CON) 20 MEQ tablet Take 20 mEq by mouth 2 (two) times daily.     rosuvastatin (CRESTOR) 20 MG tablet Take 20 mg by mouth daily.      TRULICITY 5.02 DX/4.1OI SOPN Inject 0.75 mg into the skin once a week. Thursday     ALPRAZolam (XANAX) 0.5 MG tablet Take 0.5 mg by mouth 3 (three) times daily as needed for anxiety. (Patient not taking: Reported on 06/15/2022)     ondansetron (ZOFRAN) 8 MG tablet Take 1 tablet (8 mg total) by mouth every 8 (eight) hours as needed. (Patient not taking: Reported on 06/15/2022) 30 tablet 0   pantoprazole (PROTONIX) 20 MG tablet Take 1 tablet (20 mg total) by mouth daily. (Patient not taking: Reported on 06/15/2022) 30 tablet 0  prochlorperazine (COMPAZINE) 10 MG tablet Take 1 tablet (10 mg total) by mouth every 6 (six) hours as needed for nausea or vomiting. (Patient not taking: Reported on 06/15/2022) 30 tablet 0   No current facility-administered medications on file prior to visit.    Allergies:  Allergies  Allergen Reactions   Penicillins Hives, Rash and Other (See Comments)    Has patient had a PCN reaction causing immediate rash, facial/tongue/throat swelling, SOB or lightheadedness with hypotension: Yes Has patient had a PCN reaction causing severe rash involving mucus membranes or skin necrosis: No Has patient had a PCN reaction that required hospitalization emergency  room visit but did not stay Has patient had a PCN reaction occurring within the last 10 years: No If all of the above answers are "NO", then may proceed with Cephalosporin use     Past Medical History:  Past Medical History:  Diagnosis Date   Anemia 12/22/2012   Anxiety 2012   Arthritis    RA   Bilateral anterior knee pain 2012   Brain tumor ([facility])    Diabetes mellitus without complication ([facility])    GERD (gastroesophageal reflux disease) 2000   come and go   Headache    As a child   Hyperlipidemia    Hypertension 2008   Panic attack 2012   situational related to move from [place].    Past Surgical History:  Past Surgical History:  Procedure Laterality Date   APPLICATION OF CRANIAL NAVIGATION N/A 07/02/2019   Procedure: APPLICATION OF CRANIAL NAVIGATION;  Surgeon: Judith Part, MD;  Location: [facility];  Service: Neurosurgery;  Laterality: N/A;   CESAREAN SECTION  11/22/2000   MENISCUS REPAIR Left 11/22/2009   L knee for bilateral meniscal tear. Following acute injury.    PARTIAL HYSTERECTOMY  11/23/2007   RETROSIGMOID CRANIECTOMY FOR TUMOR RESECTION Left 07/02/2019   Procedure: Left Retrosigmoid craniotomy for tumor resection;  Surgeon: Judith Part, MD;  Location: [facility];  Service: Neurosurgery;  Laterality: Left;   Social History:  Social History   Socioeconomic History   Marital status: Single    Spouse name: Not on file   Number of children: 3   Years of education: assoc. 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       epworth sleepiness scale = 3 (02/27/16)       Social Determinants of Health   Financial Resource Strain: Not on file  Food Insecurity: Not on file  Transportation Needs: Not on file  Physical Activity: Not on file  Stress: Not on file  Social Connections: Not on file  Intimate Partner Violence: Not on file   Family History:  Family History  Problem Relation Age of Onset   Diabetes Mother    Hypertension Mother    Heart disease Mother 84   Stroke Mother 41  x 2   Alcohol abuse Mother        previous    Drug abuse Mother        previous cocaine    Heart attack Mother    Hypertension Father    Colon cancer Neg Hx    Esophageal cancer Neg Hx    Stomach cancer Neg Hx    Rectal cancer Neg Hx     Review of Systems: Constitutional: Doesn't report fevers, chills or abnormal weight loss Eyes: Doesn't report blurriness of vision Ears, nose, mouth, throat, and face: Doesn't report sore throat Respiratory: Doesn't report cough, dyspnea or wheezes Cardiovascular: Doesn't report palpitation, chest discomfort  Gastrointestinal:  Doesn't report nausea, constipation, diarrhea GU: Doesn't report incontinence Skin: Doesn't report skin rashes Neurological: Per HPI Musculoskeletal: Doesn't report joint pain Behavioral/Psych: Doesn't report anxiety  Physical Exam: Vitals:   06/15/22 1115  BP: 115/75  Pulse: 94  Resp: 20  Temp: 97.7 F (36.5 C)  SpO2: 100%   KPS: 90. General: Alert, cooperative, pleasant, in no acute distress Head: Normal EENT: No conjunctival injection or scleral icterus.  Lungs: Resp effort normal Cardiac: Regular rate Abdomen: Non-distended abdomen Skin: No rashes cyanosis or petechiae. Extremities: No clubbing or edema  Neurologic Exam: Mental Status: Awake, alert, attentive to examiner. Oriented to self and environment.  Language is fluent with intact comprehension.  Cranial Nerves: Visual acuity is grossly normal. Visual fields are full. Extra-ocular movements intact. No ptosis. Face is symmetric Motor: Tone and bulk are normal. Power is full in both arms and legs. Reflexes are symmetric, no pathologic reflexes present.  Sensory: Intact to light touch Gait: Normal.   Labs: I have reviewed the data as listed    Component Value Date/Time   NA 138 04/14/2022 2108   NA CANCELED 12/15/2017 1150   K 3.4 (L) 04/14/2022 2108   K 3.7 01/03/2014 0000   CL 98 04/14/2022 2108   CL 100 01/03/2014 0000   CO2 29 04/14/2022 2108   CO2 28 01/03/2014 0000   GLUCOSE 135 (H) 04/14/2022 2108   BUN 17 04/14/2022 2108   BUN CANCELED 12/15/2017 1150   BUN 13 01/03/2014 0000   CREATININE 0.90 04/14/2022 2108   CREATININE 0.79 10/08/2021 1530   CREATININE 0.81 06/06/2014 1557   CALCIUM 10.3 04/14/2022 2108   CALCIUM 10.3 01/03/2014 0000   PROT 7.7 10/08/2021 1530   PROT 7.0 01/03/2014 0000   ALBUMIN 3.8 10/08/2021 1530   ALBUMIN 4.0 01/03/2014 0000   AST 12 (L) 10/08/2021 1530   ALT 12 10/08/2021 1530   ALKPHOS 62 10/08/2021 1530   ALKPHOS 50 01/03/2014 0000   BILITOT 0.8 10/08/2021 1530   GFRNONAA >60 04/14/2022 2108   GFRNONAA >60 10/08/2021 1530   GFRNONAA >89 01/03/2014 0000   GFRAA >60 06/11/2020 1311   GFRAA >89 01/03/2014 0000   Lab Results  Component Value Date   WBC 7.4 04/14/2022   NEUTROABS 4.3 10/08/2021   HGB 17.0 (H) 04/14/2022   HCT 52.3 (H) 04/14/2022   MCV 85.2 04/14/2022   PLT 244 04/14/2022    Imaging: [facility] Clinician Interpretation: I have personally reviewed the CNS images as listed.  My interpretation, in the context of the patient's clinical presentation, is stable disease  MR BRAIN W WO CONTRAST  Result Date: 06/08/2022 CLINICAL DATA:  Brain/CNS neoplasm. Assess treatment response. History of meningioma resection. EXAM: MRI HEAD WITHOUT AND WITH CONTRAST TECHNIQUE: Multiplanar,  multiecho pulse sequences of the brain and surrounding structures were  obtained without and with intravenous contrast. CONTRAST:  72m GADAVIST GADOBUTROL 1 MMOL/ML IV SOLN COMPARISON:  MRI 12/17/2021 FINDINGS: Brain: Diffusion imaging does not show any acute or subacute infarction. No focal abnormality affects the pons or cerebellum. Cerebral hemispheres themselves are normal without evidence old or recent stroke. No hydrocephalus or extra-axial fluid collection. Previous left occipital craniotomy as seen on prior studies. Residual meningioma tumor along the anterior left tentorium is stable, measuring approximately 22 x 7 x 11 mm. Minimal mass effect upon the left side of the pons. Tumor is again seen to extend into left Meckel's cave and along the medial floor of the left middle cranial fossa. Dural enhancement extends to the mouth of the internal auditory canal on the left but not visibly into it. Vascular: Major vessels at the base of the brain show flow. Skull and upper cervical spine: Otherwise negative Sinuses/Orbits: Clear/normal Other: None IMPRESSION: No change from the prior 2 studies. Meningioma along the anterior tentorium on the left measuring approximately 22 x 7 x 11 mm with extension into left Meckel's cave. Slight growth in tumor when compared to older studies, reference 10/03/2020. Electronically Signed   By: MNelson ChimesM.D.   On: 06/08/2022 16:43      Assessment/Plan [place]  Neuropathic pain due to radiation  RLynita LombardRawls is clinically stable today from tumor burden or neurologic standpoint.  MRI demonstrates stable findings overall.  For headaches, may be partly secondary to radiation exposure to trigeminal ganglion.  Recommended short course of prednisone '50mg'$  daily.  Also will start Elavil '50mg'$  HS, which can help with neuropathic pain, headaches, depression symptoms, sleep issues.  She is agreeable with this.  Counseled extensively on sleep hygeine and stress  management.  Recommended 1 month follow up for headache management.  Next MRI brain in 6 months.  All questions were answered. The patient knows to call the clinic with any problems, questions or concerns. No barriers to learning were detected.  The total time spent in the encounter was 45 minutes and more than 50% was on counseling and review of test results   ZVentura Sellers MD Medical Director of Neuro-Oncology [facility] [place]07/25/23 12:43 PM

## 2022-06-16 ENCOUNTER — Telehealth: Payer: Self-pay | Admitting: Internal Medicine

## 2022-06-16 NOTE — Telephone Encounter (Signed)
Per 7/25 los called and spoke to pt about appointment Pt confirmed appointment

## 2022-06-17 ENCOUNTER — Other Ambulatory Visit: Payer: Self-pay

## 2022-07-06 ENCOUNTER — Telehealth: Payer: Self-pay

## 2022-07-06 NOTE — Telephone Encounter (Signed)
Notified Patient of completion of Disability and Leave Forms. Fax transmission confirmation received. Copy of Forms placed for pick-up as requested by Patient. No other needs or concerns voiced at this time.

## 2022-07-13 ENCOUNTER — Inpatient Hospital Stay: Payer: 59 | Attending: Internal Medicine | Admitting: Internal Medicine

## 2022-07-30 ENCOUNTER — Encounter (HOSPITAL_BASED_OUTPATIENT_CLINIC_OR_DEPARTMENT_OTHER): Payer: Self-pay | Admitting: Urology

## 2022-07-30 ENCOUNTER — Emergency Department (HOSPITAL_BASED_OUTPATIENT_CLINIC_OR_DEPARTMENT_OTHER): Payer: 59

## 2022-07-30 ENCOUNTER — Other Ambulatory Visit: Payer: Self-pay

## 2022-07-30 ENCOUNTER — Emergency Department (HOSPITAL_BASED_OUTPATIENT_CLINIC_OR_DEPARTMENT_OTHER)
Admission: EM | Admit: 2022-07-30 | Discharge: 2022-07-30 | Disposition: A | Discharge status: Home / Self Care | Payer: 59 | Attending: Emergency Medicine | Admitting: Emergency Medicine

## 2022-07-30 DIAGNOSIS — R0781 Pleurodynia: Secondary | ICD-10-CM

## 2022-07-30 DIAGNOSIS — Z85038 Personal history of other malignant neoplasm of large intestine: Secondary | ICD-10-CM | POA: Insufficient documentation

## 2022-07-30 DIAGNOSIS — R519 Headache, unspecified: Secondary | ICD-10-CM

## 2022-07-30 DIAGNOSIS — R079 Chest pain, unspecified: Secondary | ICD-10-CM | POA: Diagnosis present

## 2022-07-30 DIAGNOSIS — Z794 Long term (current) use of insulin: Secondary | ICD-10-CM | POA: Diagnosis not present

## 2022-07-30 DIAGNOSIS — J181 Lobar pneumonia, unspecified organism: Secondary | ICD-10-CM | POA: Insufficient documentation

## 2022-07-30 DIAGNOSIS — J189 Pneumonia, unspecified organism: Secondary | ICD-10-CM

## 2022-07-30 DIAGNOSIS — M79602 Pain in left arm: Secondary | ICD-10-CM | POA: Insufficient documentation

## 2022-07-30 DIAGNOSIS — M542 Cervicalgia: Secondary | ICD-10-CM | POA: Diagnosis not present

## 2022-07-30 DIAGNOSIS — R42 Dizziness and giddiness: Secondary | ICD-10-CM | POA: Diagnosis not present

## 2022-07-30 DIAGNOSIS — Z7982 Long term (current) use of aspirin: Secondary | ICD-10-CM | POA: Diagnosis not present

## 2022-07-30 DIAGNOSIS — Z7984 Long term (current) use of oral hypoglycemic drugs: Secondary | ICD-10-CM | POA: Insufficient documentation

## 2022-07-30 DIAGNOSIS — E119 Type 2 diabetes mellitus without complications: Secondary | ICD-10-CM | POA: Insufficient documentation

## 2022-07-30 LAB — BASIC METABOLIC PANEL
Anion gap: 10 (ref 5–15)
BUN: 17 mg/dL (ref 6–20)
CO2: 24 mmol/L (ref 22–32)
Calcium: 9.2 mg/dL (ref 8.9–10.3)
Chloride: 104 mmol/L (ref 98–111)
Creatinine, Ser: 0.78 mg/dL (ref 0.44–1.00)
GFR, Estimated: >60 mL/min (ref >60)
Glucose, Bld: 163 mg/dL — ABNORMAL HIGH (ref 70–99)
Potassium: 3.6 mmol/L (ref 3.5–5.1)
Sodium: 138 mmol/L (ref 135–145)

## 2022-07-30 LAB — CBC
HCT: 45.8 % (ref 36.0–46.0)
Hemoglobin: 15.3 g/dL — ABNORMAL HIGH (ref 12.0–15.0)
MCH: 28.5 pg (ref 26.0–34.0)
MCHC: 33.4 g/dL (ref 30.0–36.0)
MCV: 85.3 fL (ref 80.0–100.0)
Platelets: 291 10*3/uL (ref 150–400)
RBC: 5.37 MIL/uL — ABNORMAL HIGH (ref 3.87–5.11)
RDW: 13.6 % (ref 11.5–15.5)
WBC: 7.6 10*3/uL (ref 4.0–10.5)
nRBC: 0 % (ref 0.0–0.2)

## 2022-07-30 LAB — D-DIMER, QUANTITATIVE: D-Dimer, Quant: <0.27 ug/mL-FEU (ref 0.00–0.50)

## 2022-07-30 LAB — TROPONIN I (HIGH SENSITIVITY): Troponin I (High Sensitivity): <2 ng/L (ref <18)

## 2022-07-30 LAB — CBG MONITORING, ED: Glucose-Capillary: 189 mg/dL — ABNORMAL HIGH (ref 70–99)

## 2022-07-30 LAB — PREGNANCY, URINE: Preg Test, Ur: NEGATIVE

## 2022-07-30 MED ORDER — LEVOFLOXACIN 750 MG PO TABS
750.0000 mg | ORAL_TABLET | Freq: Every day | ORAL | 0 refills | Status: DC
Start: 2022-07-30 — End: 2022-10-18

## 2022-07-30 MED ORDER — SODIUM CHLORIDE 0.9 % IV BOLUS
1000.0000 mL | Freq: Once | INTRAVENOUS | Status: AC
  Administered 2022-07-30: 1000 mL via INTRAVENOUS

## 2022-07-30 MED ORDER — KETOROLAC TROMETHAMINE 15 MG/ML IJ SOLN
15.0000 mg | Freq: Once | INTRAMUSCULAR | Status: AC
Start: 2022-07-30 — End: 2022-07-30
  Administered 2022-07-30: 15 mg via INTRAVENOUS
  Filled 2022-07-30: qty 1

## 2022-07-30 MED ORDER — FAMOTIDINE 20 MG PO TABS: 20.0000 mg | ORAL_TABLET | Freq: Two times a day (BID) | ORAL | 0 refills | Status: AC

## 2022-07-30 NOTE — ED Provider Notes (Signed)
[facility] DEPARTMENT Provider Note   CSN: [id number] Arrival date & time: 07/30/22  1755     History  Chief Complaint  Patient presents with   Chest Pain    Carolyn Holland is a 51 y.o. female.  Patient with a past medical history of colorectal cancer treated with surgery, hyperlipidemia, and type II diabetes mellitus who presents to the ED with complaints of two weeks of chest pain, left sided neck pain, left arm pain, and headaches. She also reports 1 day of dizziness and nausea. Patient reports of intermittent central chest pain that is worse when laying on her left side and when taking a deep breath in. She describes it as a burning sensation and she thought that it was related to acid reflux. However, the Alka-Seltzer she would take did not relieve her symptoms. She also reports that she started experiencing her headaches, left sided neck pain, and left arm pain around the same time. She has tried taking ASA without relief. She was also prescribed a three-day course of antibiotics out any relief (unsure of name). Patient also attests that 4-5 nights ago, her chest pain woke her out of her sleep and she also started experiencing palpations which prevented her from going back to sleep. Patient reports that today, she started experiencing her symptoms which provoked her dizziness and nausea and she grew concerned about her symptoms. Patient denies any fevers, chills, diaphoresis, shortness of breath, current palpitations, leg swelling, abdominal pain, vomiting, or any urinary symptoms.  Patient takes hydrochlorothiazide. Patient is not on any blood thinners. Patient denies any exogenous estrogen use, recent surgeries, recent long-distance travel, or any tobacco history.        Home Medications Prior to Admission medications   Medication Sig Start Date End Date Taking? Authorizing Provider  ALPRAZolam Duanne Moron) 0.5 MG tablet Take 0.5 mg by mouth 3 (three) times daily as  needed for anxiety. Patient not taking: Reported on 06/15/2022 10/13/20   [provider]  amitriptyline (ELAVIL) 50 MG tablet Take 1 tablet (50 mg total) by mouth at bedtime. 06/15/22   Ventura Sellers, MD  aspirin 325 MG tablet Take 325 mg by mouth daily.    [provider]  chlorthalidone (HYGROTON) 25 MG tablet Take 25 mg by mouth daily. 05/18/22   [provider]  Continuous Blood Gluc Sensor (FREESTYLE LIBRE 2 SENSOR) MISC See admin instructions. 06/20/20   [provider]  diclofenac sodium (VOLTAREN) 1 % GEL Apply 2 g topically 4 (four) times daily as needed for pain. 05/12/19   [provider]  gabapentin (NEURONTIN) 300 MG capsule Take 300 mg by mouth 3 (three) times daily. 09/22/20   [provider]  glimepiride (AMARYL) 2 MG tablet Take 2 mg by mouth 2 (two) times daily as needed (elevated sugar level). 07/17/20   [provider]  JARDIANCE 25 MG TABS tablet Take 25 mg by mouth daily. 10/02/20   [provider]  metFORMIN (GLUCOPHAGE-XR) 500 MG 24 hr tablet Take 500 mg by mouth daily. 02/21/19   [provider]  Multiple Vitamins-Minerals (MULTI FOR HER) TABS See admin instructions.    [provider]  omeprazole (PRILOSEC) 40 MG capsule Take 40 mg by mouth daily as needed (acid reflux/indigestion.).    [provider]  ondansetron (ZOFRAN) 8 MG tablet Take 1 tablet (8 mg total) by mouth every 8 (eight) hours as needed. Patient not taking: Reported on 06/15/2022 06/22/21   Ledell Peoples  IV, MD  pantoprazole (PROTONIX) 20 MG tablet Take 1 tablet (20 mg total) by mouth daily. Patient not taking: Reported on 06/15/2022 12/17/21   Domenic Moras, PA-C  potassium chloride SA (KLOR-CON) 20 MEQ tablet Take 20 mEq by mouth 2 (two) times daily. 07/30/20   [provider]  predniSONE (DELTASONE) 50 MG tablet Take 1 tablet (50 mg total) by mouth daily with breakfast. 06/15/22   Vaslow, Acey Lav, MD   prochlorperazine (COMPAZINE) 10 MG tablet Take 1 tablet (10 mg total) by mouth every 6 (six) hours as needed for nausea or vomiting. Patient not taking: Reported on 06/15/2022 06/22/21   Orson Slick, MD  rosuvastatin (CRESTOR) 20 MG tablet Take 20 mg by mouth daily.     [provider]  TRULICITY 4.70 JG/2.8ZM SOPN Inject 0.75 mg into the skin once a week. Thursday 08/27/20   [provider]      Allergies    Penicillins    Review of Systems   Review of Systems  Physical Exam Updated Vital Signs BP 113/82   Pulse 86   Temp 98.2 F (36.8 C) (Oral)   Resp (!) 21   Ht '5\' 6"'$  (1.676 m)   Wt 110.1 kg   SpO2 99%   BMI 39.18 kg/m   Physical Exam Vitals and nursing note reviewed.  Constitutional:      Appearance: She is well-developed. She is not diaphoretic.  HENT:     Head: Normocephalic and atraumatic.     Mouth/Throat:     Mouth: Mucous membranes are not dry.  Eyes:     Conjunctiva/sclera: Conjunctivae normal.  Neck:     Vascular: Normal carotid pulses. No JVD.     Trachea: Trachea normal. No tracheal deviation.  Cardiovascular:     Rate and Rhythm: Normal rate and regular rhythm.     Pulses: No decreased pulses.          Radial pulses are 2+ on the right side and 2+ on the left side.     Heart sounds: Normal heart sounds, S1 normal and S2 normal. No murmur heard. Pulmonary:     Effort: Pulmonary effort is normal. No respiratory distress.     Breath sounds: No wheezing.  Chest:     Chest wall: No tenderness.  Abdominal:     General: Bowel sounds are normal.     Palpations: Abdomen is soft.     Tenderness: There is no abdominal tenderness. There is no guarding or rebound.  Musculoskeletal:        General: Normal range of motion.     Cervical back: Normal range of motion and neck supple. No muscular tenderness.  Skin:    General: Skin is warm and dry.     Coloration: Skin is not pale.  Neurological:     Mental Status: She is alert.   Psychiatric:        Mood and Affect: Mood is anxious.     ED Results / Procedures / Treatments   Labs (all labs ordered are listed, but only abnormal results are displayed) Labs Reviewed  BASIC METABOLIC PANEL - Abnormal; Notable for the following components:      Result Value   Glucose, Bld 163 (*)    All other components within normal limits  CBC - Abnormal; Notable for the following components:   RBC 5.37 (*)    Hemoglobin 15.3 (*)    All other components within normal limits  CBG MONITORING, ED -  Abnormal; Notable for the following components:   Glucose-Capillary 189 (*)    All other components within normal limits  PREGNANCY, URINE  D-DIMER, QUANTITATIVE  TROPONIN I (HIGH SENSITIVITY)    EKG EKG Interpretation  Date/Time:  Friday July 30 2022 18:04:58 EDT Ventricular Rate:  87 PR Interval:  170 QRS Duration: 94 QT Interval:  365 QTC Calculation: 440 R Axis:   22 Text Interpretation: Sinus rhythm Confirmed by Garnette Gunner [phone]) on 07/30/2022 6:30:07 PM  Radiology DG Chest 2 View  Result Date: 07/30/2022 CLINICAL DATA:  Chest pain. EXAM: CHEST - 2 VIEW COMPARISON:  04/14/2022 FINDINGS: The heart size and mediastinal contours are within normal limits. New pulmonary infiltrate is seen in the left lung base, suspicious for pneumonia. No evidence of pleural effusion. IMPRESSION: New left basilar infiltrate, suspicious for pneumonia. Recommend continued chest radiographic follow-up to confirm resolution. Electronically Signed   By: Marlaine Hind M.D.   On: 07/30/2022 18:35    Procedures Procedures    Medications Ordered in ED Medications  sodium chloride 0.9 % bolus 1,000 mL (1,000 mLs Intravenous New Bag/Given 07/30/22 1925)  ketorolac (TORADOL) 15 MG/ML injection 15 mg (15 mg Intravenous Given 07/30/22 2003)    ED Course/ Medical Decision Making/ A&P    Patient seen and examined. History obtained directly from patient. Work-up including labs, imaging, EKG  ordered in triage, if performed, were reviewed.    Labs/EKG: Independently reviewed and interpreted.  This included: CBC with normal white blood cell count and mildly increased hemoglobin which has been chronic over the past year; BMP with glucose 163 with normal anion gap otherwise normal electrolytes and kidney function; first troponin less than 2.  Imaging: Independently visualized and interpreted.  This included: CXR with left basilar infiltrate.  Medications/Fluids: Ordered: IV fluid bolus  Most recent vital signs reviewed and are as follows: BP 113/82   Pulse 86   Temp 98.2 F (36.8 C) (Oral)   Resp (!) 21   Ht '5\' 6"'$  (1.676 m)   Wt 110.1 kg   SpO2 99%   BMI 39.18 kg/m   Initial impression: Atypical chest pain, symptoms are not classic for pneumonia.  History of rectal cancer a year ago, treated with surgery.  No recent treatment or history of metastasis.  Low risk Wells, D-dimer ordered.  8:37 PM Reassessment performed. Patient appears stable.  We discussed results and she seems reassured.  Patient did find out that she took azithromycin for 3 days initially.  Labs personally reviewed and interpreted including: D-dimer negative, pregnancy negative.  Reviewed pertinent lab work and imaging with patient at bedside. Questions answered.   Most current vital signs reviewed and are as follows: BP 119/88   Pulse 71   Temp 98.2 F (36.8 C) (Oral)   Resp 18   Ht '5\' 6"'$  (1.676 m)   Wt 110.1 kg   SpO2 100%   BMI 39.18 kg/m   Plan: Discharge to home.   Prescriptions written for: Pepcid trial, Levaquin for pneumonia treatment.  I opted for this antibiotic due to severe allergy to penicillin which precludes use of amoxicillin or third-generation cephalosporin.  Other home care instructions discussed: Rest, hydration  ED return instructions discussed: Return with worsening shortness of breath, high persistent fever, persistent vomiting, worsening chest pain or lower extremity  edema.  Follow-up instructions discussed: Patient encouraged to follow-up with their PCP in 5 days.  She states that she has an appointment upcoming already scheduled for 9/11.  Discussed that she  may, at some point, need repeat x-ray of the chest to ensure that area in the left lower lobe improves.                              Medical Decision Making Amount and/or Complexity of Data Reviewed Labs: ordered. Radiology: ordered.  Risk Prescription drug management.   For this patient's complaint of chest pain, the following emergent conditions were considered on the differential diagnosis: acute coronary syndrome, pulmonary embolism, pneumothorax, myocarditis, pericardial tamponade, aortic dissection, thoracic aortic aneurysm complication, esophageal perforation.   Other causes were also considered including: gastroesophageal reflux disease, musculoskeletal pain including costochondritis, pneumonia/pleurisy, herpes zoster, pericarditis.  In regards to possibility of ACS, patient has atypical features of pain, non-ischemic and unchanged EKG and negative troponin(s). Heart score was calculated to be 2.   In regards to possibility of PE, symptoms are atypical for PE and risk profile is low with negative D-dimer.  Chest x-ray with question of left lower lobe infiltrate.  Given no other obvious etiology of symptoms, will treat with course of levofloxacin due to beta-lactam allergy.  The patient's vital signs, pertinent lab work and imaging were reviewed and interpreted as discussed in the ED course. Hospitalization was considered for further testing, treatments, or serial exams/observation. However as patient is well-appearing, has a stable exam, and reassuring studies today, I do not feel that they warrant admission at this time. This plan was discussed with the patient who verbalizes agreement and comfort with this plan and seems reliable and able to return to the Emergency Department with  worsening or changing symptoms.          Final Clinical Impression(s) / ED Diagnoses Final diagnoses:  Pleuritic chest pain  Acute nonintractable headache, unspecified headache type  Pneumonia of left lower lobe due to infectious organism    Rx / DC Orders ED Discharge Orders          Ordered    famotidine (PEPCID) 20 MG tablet  2 times daily        07/30/22 2030    levofloxacin (LEVAQUIN) 750 MG tablet  Daily        07/30/22 2030              Carlisle Cater, PA-C 07/30/22 2041    Cristie Hem, MD 07/31/22 1115

## 2022-07-30 NOTE — Discharge Instructions (Addendum)
Please read and follow all provided instructions.  Your diagnoses today include:  1. Pleuritic chest pain   2. Acute nonintractable headache, unspecified headache type   3. Pneumonia of left lower lobe due to infectious organism     Tests performed today include: An EKG of your heart: No signs of arrhythmia or irritation of the heart A chest x-ray: Shows haziness and possible pneumonia in the left lower lobe Cardiac enzymes: a blood test for heart muscle damage, no sign of heart attack or irritation of the heart muscle Blood counts and electrolytes: No concerns D-dimer: Screening test for blood clot was normal Vital signs. See below for your results today.   Medications prescribed:  Pepcid (famotidine) - antihistamine  You can find this medication over-the-counter.   DO NOT exceed:  '20mg'$  Pepcid every 12 hours  Levaquin: Medication for pneumonia  Take any prescribed medications only as directed.  Follow-up instructions: Please follow-up with your primary care provider as planned for reevaluation.  You may need repeat of your chest x-ray to make sure that the abnormal area noted on x-ray today, resolves with treatment.  Return instructions:  SEEK IMMEDIATE MEDICAL ATTENTION IF: You have severe chest pain, especially if the pain is crushing or pressure-like and spreads to the arms, back, neck, or jaw, or if you have sweating, nausea or vomiting, or trouble with breathing. THIS IS AN EMERGENCY. Do not wait to see if the pain will go away. Get medical help at once. Call 911. DO NOT drive yourself to the hospital.  Your chest pain gets worse and does not go away after a few minutes of rest.  You have an attack of chest pain lasting longer than what you usually experience.  You have significant dizziness, if you pass out, or have trouble walking.  You have chest pain not typical of your usual pain for which you originally saw your caregiver.  You have any other emergent concerns  regarding your health.  Additional Information: Chest pain comes from many different causes. Your caregiver has diagnosed you as having chest pain that is not specific for one problem, but does not require admission.  You are at low risk for an acute heart condition or other serious illness.   Your vital signs today were: BP 119/88   Pulse 71   Temp 98.2 F (36.8 C) (Oral)   Resp 18   Ht '5\' 6"'$  (1.676 m)   Wt 110.1 kg   SpO2 100%   BMI 39.18 kg/m  If your blood pressure (BP) was elevated above 135/85 this visit, please have this repeated by your doctor within one month. --------------

## 2022-07-30 NOTE — ED Notes (Signed)
Patient transported to X-ray 

## 2022-07-30 NOTE — ED Triage Notes (Signed)
Chest pain intermittent x 2 weeks and is worsening today States dizziness and sharp pain radiating down left arm and into neck that started today  States dry mouth and thirsty  H/o DM, has not checked CBG today Seen at PCP yest

## 2022-08-04 ENCOUNTER — Other Ambulatory Visit: Payer: Self-pay

## 2022-08-15 ENCOUNTER — Emergency Department (HOSPITAL_BASED_OUTPATIENT_CLINIC_OR_DEPARTMENT_OTHER): Payer: 59

## 2022-08-15 ENCOUNTER — Encounter (HOSPITAL_BASED_OUTPATIENT_CLINIC_OR_DEPARTMENT_OTHER): Payer: Self-pay | Admitting: Emergency Medicine

## 2022-08-15 ENCOUNTER — Other Ambulatory Visit: Payer: Self-pay

## 2022-08-15 ENCOUNTER — Emergency Department (HOSPITAL_BASED_OUTPATIENT_CLINIC_OR_DEPARTMENT_OTHER)
Admission: EM | Admit: 2022-08-15 | Discharge: 2022-08-15 | Disposition: A | Discharge status: Home / Self Care | Payer: 59 | Attending: Emergency Medicine | Admitting: Emergency Medicine

## 2022-08-15 DIAGNOSIS — J189 Pneumonia, unspecified organism: Secondary | ICD-10-CM

## 2022-08-15 DIAGNOSIS — J168 Pneumonia due to other specified infectious organisms: Secondary | ICD-10-CM | POA: Diagnosis not present

## 2022-08-15 DIAGNOSIS — Z85038 Personal history of other malignant neoplasm of large intestine: Secondary | ICD-10-CM | POA: Diagnosis not present

## 2022-08-15 DIAGNOSIS — Z7982 Long term (current) use of aspirin: Secondary | ICD-10-CM | POA: Diagnosis not present

## 2022-08-15 DIAGNOSIS — R0602 Shortness of breath: Secondary | ICD-10-CM | POA: Diagnosis present

## 2022-08-15 DIAGNOSIS — Z794 Long term (current) use of insulin: Secondary | ICD-10-CM | POA: Insufficient documentation

## 2022-08-15 DIAGNOSIS — Z7984 Long term (current) use of oral hypoglycemic drugs: Secondary | ICD-10-CM | POA: Diagnosis not present

## 2022-08-15 DIAGNOSIS — Z20822 Contact with and (suspected) exposure to covid-19: Secondary | ICD-10-CM | POA: Diagnosis not present

## 2022-08-15 DIAGNOSIS — E119 Type 2 diabetes mellitus without complications: Secondary | ICD-10-CM | POA: Insufficient documentation

## 2022-08-15 LAB — COMPREHENSIVE METABOLIC PANEL
ALT: 15 U/L (ref 0–44)
AST: 16 U/L (ref 15–41)
Albumin: 3.5 g/dL (ref 3.5–5.0)
Alkaline Phosphatase: 56 U/L (ref 38–126)
Anion gap: 7 (ref 5–15)
BUN: 16 mg/dL (ref 6–20)
CO2: 25 mmol/L (ref 22–32)
Calcium: 9.2 mg/dL (ref 8.9–10.3)
Chloride: 107 mmol/L (ref 98–111)
Creatinine, Ser: 0.72 mg/dL (ref 0.44–1.00)
GFR, Estimated: >60 mL/min (ref >60)
Glucose, Bld: 119 mg/dL — ABNORMAL HIGH (ref 70–99)
Potassium: 3.4 mmol/L — ABNORMAL LOW (ref 3.5–5.1)
Sodium: 139 mmol/L (ref 135–145)
Total Bilirubin: 1 mg/dL (ref 0.3–1.2)
Total Protein: 7.2 g/dL (ref 6.5–8.1)

## 2022-08-15 LAB — CBC WITH DIFFERENTIAL/PLATELET
Abs Immature Granulocytes: 0.05 10*3/uL (ref 0.00–0.07)
Basophils Absolute: 0.1 10*3/uL (ref 0.0–0.1)
Basophils Relative: 1 %
Eosinophils Absolute: 0.2 10*3/uL (ref 0.0–0.5)
Eosinophils Relative: 4 %
HCT: 46.7 % — ABNORMAL HIGH (ref 36.0–46.0)
Hemoglobin: 15.3 g/dL — ABNORMAL HIGH (ref 12.0–15.0)
Immature Granulocytes: 1 %
Lymphocytes Relative: 25 %
Lymphs Abs: 1.4 10*3/uL (ref 0.7–4.0)
MCH: 27.8 pg (ref 26.0–34.0)
MCHC: 32.8 g/dL (ref 30.0–36.0)
MCV: 84.9 fL (ref 80.0–100.0)
Monocytes Absolute: 0.7 10*3/uL (ref 0.1–1.0)
Monocytes Relative: 12 %
Neutro Abs: 3.3 10*3/uL (ref 1.7–7.7)
Neutrophils Relative %: 57 %
Platelets: 244 10*3/uL (ref 150–400)
RBC: 5.5 MIL/uL — ABNORMAL HIGH (ref 3.87–5.11)
RDW: 13.3 % (ref 11.5–15.5)
WBC: 5.7 10*3/uL (ref 4.0–10.5)
nRBC: 0 % (ref 0.0–0.2)

## 2022-08-15 LAB — RESP PANEL BY RT-PCR (FLU A&B, COVID) ARPGX2
Influenza A by PCR: NEGATIVE
Influenza B by PCR: NEGATIVE
SARS Coronavirus 2 by RT PCR: NEGATIVE

## 2022-08-15 LAB — TROPONIN I (HIGH SENSITIVITY)
Troponin I (High Sensitivity): 2 ng/L (ref <18)
Troponin I (High Sensitivity): <2 ng/L (ref <18)

## 2022-08-15 MED ORDER — AZITHROMYCIN 250 MG PO TABS
250.0000 mg | ORAL_TABLET | Freq: Every day | ORAL | 0 refills | Status: DC
Start: 2022-08-15 — End: 2022-10-18

## 2022-08-15 MED ORDER — LORAZEPAM 2 MG/ML IJ SOLN
1.0000 mg | Freq: Once | INTRAMUSCULAR | Status: AC
  Administered 2022-08-15: 1 mg via INTRAVENOUS
  Filled 2022-08-15: qty 1

## 2022-08-15 MED ORDER — ALBUTEROL SULFATE HFA 108 (90 BASE) MCG/ACT IN AERS: 1.0000 | INHALATION_SPRAY | Freq: Four times a day (QID) | RESPIRATORY_TRACT | 0 refills | Status: DC | PRN

## 2022-08-15 MED ORDER — AMOXICILLIN-POT CLAVULANATE 875-125 MG PO TABS
1.0000 | ORAL_TABLET | Freq: Two times a day (BID) | ORAL | 0 refills | Status: DC
Start: 2022-08-15 — End: 2022-10-18

## 2022-08-15 MED ORDER — IOHEXOL 350 MG/ML SOLN
75.0000 mL | Freq: Once | INTRAVENOUS | Status: AC | PRN
  Administered 2022-08-15: 75 mL via INTRAVENOUS

## 2022-08-15 NOTE — ED Provider Notes (Signed)
I provided a substantive portion of the care of this patient.  I personally performed the entirety of the history for this encounter.  EKG Interpretation  Date/Time:  Sunday August 15 2022 18:00:10 EDT Ventricular Rate:  81 PR Interval:  164 QRS Duration: 94 QT Interval:  381 QTC Calculation: 443 R Axis:   -1 Text Interpretation: Sinus rhythm Consider left ventricular hypertrophy no sig change from previous Confirmed by Charlesetta Shanks [phone]) on 08/15/2022 8:15:18 PM   Patient has had persistent symptoms of slight cough and chest pain the past month and a half.  Patient was treated with Levaquin which she completed.  No improvement.  She has not had fever chills or body aches.  She reports she does not feel generally ill.  But she cannot get rid of the symptoms of her chest pain and slight cough.  Patient is alert and nontoxic.  Lungs clear without gross wheeze rhonchi rale.  Heart regular.  Abdomen soft and nontender.  Lower extremities no peripheral edema calves soft and nontender.  CT chest visually reviewed by myself as well as reviewed radiology interpretation shows persistent infiltrates in the lower lung fields.  At this time with persistent infiltrates and patient symptomatic with chest pain will opt for second course of antibiotics.  Patient has had 1 prior course with Levaquin.  At this time will opt for combination of Augmentin and Zithromax.  Patient reports allergy to penicillin when she was 52 years old.  She reports she got itchy body rash.  She has taken other antibiotics since without adverse reaction.  She is not sure if she has had amoxicillin.  She is agreeable to trying Augmentin.  Her full return precautions reviewed.  I recommend the patient follow-up with pulmonology due to persisting infiltrates after treatment.  Return precautions reviewed.   Charlesetta Shanks, MD 08/15/22 2135

## 2022-08-15 NOTE — ED Provider Notes (Signed)
[facility] EMERGENCY DEPARTMENT Provider Note   CSN: [id number] Arrival date & time: 08/15/22  1751     History  Chief Complaint  Patient presents with   Shortness of Breath    Carolyn Holland is a 51 y.o. female with a past medical history significant for colorectal cancer status post surgery, hyperlipidemia, type 2 diabetes who presents to the ED due to chest pain that has been present for the past month and a half.  Patient states chest pain is a pressure-like sensation associated with shortness of breath.  Chest pain worse with deep inspiration.  Patient states chest pain is also worse after eating large meals.  No history of blood clots, recent surgeries, recent long immobilizations, or hormonal treatments.  No lower extremity edema.  Denies cardiac history.  Patient was seen on 07/30/2022 for the same complaint and was diagnosed with pneumonia and discharged with Levaquin which she was compliant with.  Patient returned to the ED due to persistent pain.  She admits to some dizziness.  No fever or chills.  She also admits to some nasal congestion.  History obtained from patient and past medical records. No interpreter used during encounter.       Home Medications Prior to Admission medications   Medication Sig Start Date End Date Taking? Authorizing Provider  albuterol (VENTOLIN HFA) 108 (90 Base) MCG/ACT inhaler Inhale 1 puff into the lungs every 6 (six) hours as needed for wheezing or shortness of breath. 08/15/22  Yes Donnell Wion C, PA-C  amoxicillin-clavulanate (AUGMENTIN) 875-125 MG tablet Take 1 tablet by mouth every 12 (twelve) hours. 08/15/22  Yes Warrick Llera C, PA-C  azithromycin (ZITHROMAX) 250 MG tablet Take 1 tablet (250 mg total) by mouth daily. Take first 2 tablets together, then 1 every day until finished. 08/15/22  Yes Edilberto Roosevelt, Druscilla Brownie, PA-C  ALPRAZolam Duanne Moron) 0.5 MG tablet Take 0.5 mg by mouth 3 (three) times daily as needed for anxiety. Patient  not taking: Reported on 06/15/2022 10/13/20   [provider]  amitriptyline (ELAVIL) 50 MG tablet Take 1 tablet (50 mg total) by mouth at bedtime. 06/15/22   Ventura Sellers, MD  aspirin 325 MG tablet Take 325 mg by mouth daily.    [provider]  chlorthalidone (HYGROTON) 25 MG tablet Take 25 mg by mouth daily. 05/18/22   [provider]  Continuous Blood Gluc Sensor (FREESTYLE LIBRE 2 SENSOR) MISC See admin instructions. 06/20/20   [provider]  diclofenac sodium (VOLTAREN) 1 % GEL Apply 2 g topically 4 (four) times daily as needed for pain. 05/12/19   [provider]  famotidine (PEPCID) 20 MG tablet Take 1 tablet (20 mg total) by mouth 2 (two) times daily. 07/30/22   Carlisle Cater, PA-C  gabapentin (NEURONTIN) 300 MG capsule Take 300 mg by mouth 3 (three) times daily. 09/22/20   [provider]  glimepiride (AMARYL) 2 MG tablet Take 2 mg by mouth 2 (two) times daily as needed (elevated sugar level). 07/17/20   [provider]  JARDIANCE 25 MG TABS tablet Take 25 mg by mouth daily. 10/02/20   [provider]  levofloxacin (LEVAQUIN) 750 MG tablet Take 1 tablet (750 mg total) by mouth daily. 07/30/22   Carlisle Cater, PA-C  metFORMIN (GLUCOPHAGE-XR) 500 MG 24 hr tablet Take 500 mg by mouth daily. 02/21/19   [provider]  Multiple Vitamins-Minerals (MULTI FOR HER) TABS See admin instructions.    [provider]  omeprazole ([place])  40 MG capsule Take 40 mg by mouth daily as needed (acid reflux/indigestion.).    [provider]  ondansetron (ZOFRAN) 8 MG tablet Take 1 tablet (8 mg total) by mouth every 8 (eight) hours as needed. Patient not taking: Reported on 06/15/2022 06/22/21   Orson Slick, MD  pantoprazole (PROTONIX) 20 MG tablet Take 1 tablet (20 mg total) by mouth daily. Patient not taking: Reported on 06/15/2022 12/17/21   Domenic Moras, PA-C  potassium chloride SA (KLOR-CON) 20 MEQ tablet Take  20 mEq by mouth 2 (two) times daily. 07/30/20   [provider]  predniSONE (DELTASONE) 50 MG tablet Take 1 tablet (50 mg total) by mouth daily with breakfast. 06/15/22   Vaslow, Acey Lav, MD  prochlorperazine (COMPAZINE) 10 MG tablet Take 1 tablet (10 mg total) by mouth every 6 (six) hours as needed for nausea or vomiting. Patient not taking: Reported on 06/15/2022 06/22/21   Orson Slick, MD  rosuvastatin (CRESTOR) 20 MG tablet Take 20 mg by mouth daily.     [provider]  TRULICITY 9.37 TK/2.4OX SOPN Inject 0.75 mg into the skin once a week. Thursday 08/27/20   [provider]      Allergies    Penicillins    Review of Systems   Review of Systems  Constitutional:  Negative for chills and fever.  Respiratory:  Positive for shortness of breath.   Cardiovascular:  Positive for chest pain. Negative for leg swelling.  Gastrointestinal:  Negative for abdominal pain, diarrhea, nausea and vomiting.  Neurological:  Positive for dizziness.  All other systems reviewed and are negative.   Physical Exam Updated Vital Signs BP (!) 130/94   Pulse 86   Temp 98.1 F (36.7 C) (Oral)   Resp 20   Ht '5\' 6"'$  (1.676 m)   Wt 109.8 kg   SpO2 100%   BMI 39.06 kg/m  Physical Exam Vitals and nursing note reviewed.  Constitutional:      General: She is not in acute distress.    Appearance: She is not ill-appearing.  HENT:     Head: Normocephalic.  Eyes:     Pupils: Pupils are equal, round, and reactive to light.  Cardiovascular:     Rate and Rhythm: Normal rate and regular rhythm.     Pulses: Normal pulses.     Heart sounds: Normal heart sounds. No murmur heard.    No friction rub. No gallop.  Pulmonary:     Effort: Pulmonary effort is normal.     Breath sounds: Normal breath sounds.  Abdominal:     General: Abdomen is flat. There is no distension.     Palpations: Abdomen is soft.     Tenderness: There is no abdominal tenderness. There is no guarding or rebound.   Musculoskeletal:        General: Normal range of motion.     Cervical back: Neck supple.  Skin:    General: Skin is warm and dry.  Neurological:     General: No focal deficit present.     Mental Status: She is alert.  Psychiatric:        Mood and Affect: Mood normal.        Behavior: Behavior normal.     ED Results / Procedures / Treatments   Labs (all labs ordered are listed, but only abnormal results are displayed) Labs Reviewed  CBC WITH DIFFERENTIAL/PLATELET - Abnormal; Notable for the following components:      Result  Value   RBC 5.50 (*)    Hemoglobin 15.3 (*)    HCT 46.7 (*)    All other components within normal limits  COMPREHENSIVE METABOLIC PANEL - Abnormal; Notable for the following components:   Potassium 3.4 (*)    Glucose, Bld 119 (*)    All other components within normal limits  RESP PANEL BY RT-PCR (FLU A&B, COVID) ARPGX2  TROPONIN I (HIGH SENSITIVITY)  TROPONIN I (HIGH SENSITIVITY)    EKG EKG Interpretation  Date/Time:  Sunday August 15 2022 18:00:10 EDT Ventricular Rate:  81 PR Interval:  164 QRS Duration: 94 QT Interval:  381 QTC Calculation: 443 R Axis:   -1 Text Interpretation: Sinus rhythm Consider left ventricular hypertrophy no sig change from previous Confirmed by Charlesetta Shanks [phone]) on 08/15/2022 8:15:18 PM  Radiology CT Angio Chest PE W and/or Wo Contrast  Result Date: 08/15/2022 CLINICAL DATA:  Patient complains of continued breathing issues. EXAM: CT ANGIOGRAPHY CHEST WITH CONTRAST TECHNIQUE: Multidetector CT imaging of the chest was performed using the standard protocol during bolus administration of intravenous contrast. Multiplanar CT image reconstructions and MIPs were obtained to evaluate the vascular anatomy. RADIATION DOSE REDUCTION: This exam was performed according to the departmental dose-optimization program which includes automated exposure control, adjustment of the mA and/or kV according to patient size and/or use of  iterative reconstruction technique. CONTRAST:  90m OMNIPAQUE IOHEXOL 350 MG/ML SOLN COMPARISON:  Chest radiograph dated August 15, 2022 FINDINGS: Cardiovascular: Satisfactory opacification of the pulmonary arteries to the segmental level. No evidence of pulmonary embolism. The heart is mildly enlarged. Main pulmonary trunk is dilated measuring up to 3.1 cm concerning for pulmonary hypertension. Mediastinum/Nodes: No enlarged mediastinal, hilar, or axillary lymph nodes. Thyroid gland, trachea, and esophagus demonstrate no significant findings. Lungs/Pleura: There are bilateral lower lobe predominant peribronchovascular opacities. No large pleural effusion. Upper Abdomen: No acute abnormality. Musculoskeletal: No chest wall abnormality. No acute or significant osseous findings. Review of the MIP images confirms the above findings. IMPRESSION: 1.  No evidence of pulmonary embolism. 2. Bilateral lower lobe predominant peribronchovascular opacities left worse than the right, suggesting pneumonia. Differential also includes pulmonary vascular congestion, clinical correlation is suggested. No large pleural effusion. Electronically Signed   By: IKeane PoliceD.O.   On: 08/15/2022 20:25   DG Chest Port 1 View  Result Date: 08/15/2022 CLINICAL DATA:  Dyspnea, chest pain EXAM: PORTABLE CHEST 1 VIEW COMPARISON:  07/30/2022 chest radiograph. FINDINGS: Stable cardiomediastinal silhouette with normal heart size. No pneumothorax. No pleural effusion. No pulmonary edema. Patchy streaky opacities at the left greater than right lung bases have improved from prior chest radiograph. IMPRESSION: Patchy streaky opacities at the left greater than right lung bases have improved from prior chest radiograph, most compatible with improving pneumonia. Electronically Signed   By: JIlona SorrelM.D.   On: 08/15/2022 18:30    Procedures Procedures    Medications Ordered in ED Medications  iohexol (OMNIPAQUE) 350 MG/ML injection 75 mL  (75 mLs Intravenous Contrast Given 08/15/22 1930)  LORazepam (ATIVAN) injection 1 mg (1 mg Intravenous Given 08/15/22 1941)    ED Course/ Medical Decision Making/ A&P Clinical Course as of 08/15/22 2139  Sun Aug 15, 2022  1902 Hemoglobin(!): 15.3 [CA]  1902 WBC: 5.7 [CA]    Clinical Course User Index [CA] ASuzy Bouchard PA-C                           Medical Decision  Making Amount and/or Complexity of Data Reviewed External Data Reviewed: notes. Labs: ordered. Decision-making details documented in ED Course. Radiology: ordered and independent interpretation performed. Decision-making details documented in ED Course. ECG/medicine tests: ordered and independent interpretation performed. Decision-making details documented in ED Course.  Risk Prescription drug management.  This patient presents to the ED for concern of SOB/CP, this involves an extensive number of treatment options, and is a complaint that carries with it a high risk of complications and morbidity.  The differential diagnosis includes PE, ACS, pneumonia, MSK etiology  51 year old female presents to the ED due to chest pain and shortness of breath.  Chest pain worse with deep inspiration and after large meals.  Denies abdominal pain.  No previous abdominal operations.  Patient seen on 9/8 for the same complaint and diagnosed with pneumonia and discharged with Levaquin which she was compliant with.  Upon arrival, stable vitals.  Patient in no acute distress.  Benign physical exam.  Lungs clear to auscultation bilaterally.  No wheeze.  No lower extremity edema.  Cardiac labs ordered to rule out ACS.  Patient has a history of colorectal cancer so will obtain CTA to rule out PE.  EKG ordered.   EKG demonstrates normal sinus rhythm.  No signs of acute ischemia.  Delta troponin flat.  Low suspicion for ACS.  CBC reassuring.  No leukocytosis.  CMP reassuring.  Normal renal function and no major electrolyte derangements.   COVID/influenza negative.  X-ray personally reviewed and interpreted which demonstrates left lower lobe opacity with improvement from previous x-ray.  CTA personally reviewed which demonstrates bilateral lower lobe opacities left worse than right suggesting pneumonia.  Patient previously treated with Levaquin.  Will treat with Augmentin and Zithromax. Patient has a PCN allergy which she notes is a rash; however believes she has taken Augmentin in the past with no reaction.  Patient agreeable to try Augmentin.  Pulmonology referral placed.  Considered hospitalization however, given normal vitals with no leukocytosis and no evidence of sepsis, feel patient is stable for outpatient treatment with 2nd course of antibiotics. No recent travel history. Patient also discharged with albuterol as needed for shortness of breath.  Low suspicion for PE/DVT given reassuring CTA. Strict ED precautions discussed with patient. Patient states understanding and agrees to plan. Patient discharged home in no acute distress and stable vitals        Final Clinical Impression(s) / ED Diagnoses Final diagnoses:  Pneumonia of both lower lobes due to infectious organism    Rx / DC Orders ED Discharge Orders          Ordered    amoxicillin-clavulanate (AUGMENTIN) 875-125 MG tablet  Every 12 hours        08/15/22 2133    azithromycin (ZITHROMAX) 250 MG tablet  Daily        08/15/22 2133    albuterol (VENTOLIN HFA) 108 (90 Base) MCG/ACT inhaler  Every 6 hours PRN        08/15/22 2133    Ambulatory referral to Pulmonology        08/15/22 2133              Karie Kirks 08/15/22 2140    Charlesetta Shanks, MD 08/22/22 1743

## 2022-08-15 NOTE — ED Triage Notes (Signed)
Pt c/o continued breathing issues; sts "sometimes I can't get my breath" and c/o mid CP with every breath; was seen here recently for same

## 2022-08-15 NOTE — Discharge Instructions (Addendum)
It was a pleasure taking care of you today. As discussed, your scans still show pneumonia. I am sending you home with a combination of antibiotics. Take as prescribed and finish all antibiotics.  I have placed a referral to pulmonology.  Expect a phone call within the next few days to schedule an appointment.  I am also sending you home with an inhaler.  Take as needed for shortness of breath.  Follow-up with PCP if symptoms do not improve over the next few days.  Return to the ER for new or worsening symptoms.

## 2022-08-17 ENCOUNTER — Other Ambulatory Visit: Payer: Self-pay

## 2022-08-28 ENCOUNTER — Other Ambulatory Visit: Payer: Self-pay

## 2022-08-30 ENCOUNTER — Telehealth: Payer: Self-pay | Admitting: *Deleted

## 2022-08-30 NOTE — Telephone Encounter (Signed)
Received request for medical information from [employer] for disability.  Paperwork completed and faxed to [phone]. Sent to be scanned to media tab.

## 2022-08-31 ENCOUNTER — Encounter: Payer: Self-pay | Admitting: Pulmonary Disease

## 2022-08-31 ENCOUNTER — Ambulatory Visit (INDEPENDENT_AMBULATORY_CARE_PROVIDER_SITE_OTHER): Payer: 59 | Admitting: Pulmonary Disease

## 2022-08-31 VITALS — BP 126/72 | HR 85 | Temp 98.0°F | Ht 66.0 in | Wt 240.0 lb

## 2022-08-31 DIAGNOSIS — R002 Palpitations: Secondary | ICD-10-CM | POA: Diagnosis not present

## 2022-08-31 DIAGNOSIS — R0609 Other forms of dyspnea: Secondary | ICD-10-CM

## 2022-08-31 MED ORDER — PANTOPRAZOLE SODIUM 40 MG PO TBEC: 40.0000 mg | DELAYED_RELEASE_TABLET | Freq: Every day | ORAL | 3 refills | Status: AC

## 2022-08-31 MED ORDER — PANTOPRAZOLE SODIUM 40 MG PO TBEC
40.0000 mg | DELAYED_RELEASE_TABLET | Freq: Every day | ORAL | 3 refills | Status: DC
Start: 2022-08-31 — End: 2022-08-31

## 2022-08-31 NOTE — Progress Notes (Signed)
Note I do not              Carolyn Holland    [id number]    Mar 27, 1971  Primary Care Physician:[facility]  Referring Physician: Suzy Bouchard, PA-C [address],  [place] [address]  Chief complaint:   Shortness of breath, chest discomfort Pain with deep breath  HPI:  Chest discomfort with deep breathing Was recently treated for pneumonia  History of reflux -Was on Protonix -Agreeable to resume medication  History of hypertension, diabetes, hypercholesterolemia, rectal cancer for which she had surgery  Never cigarette smoker, socially smoked weed-very infrequent  Works in healthcare  No history of heart disease, no history of breathing problems in the past  Was evaluated with a sleep study when she was considering bariatric surgery found to have mild obstructive sleep apnea  No history of leg swelling  History of meningioma History of TIA  Outpatient Encounter Medications as of 08/31/2022  Medication Sig   albuterol (VENTOLIN HFA) 108 (90 Base) MCG/ACT inhaler Inhale 1 puff into the lungs every 6 (six) hours as needed for wheezing or shortness of breath.   ALPRAZolam (XANAX) 0.5 MG tablet Take 0.5 mg by mouth 3 (three) times daily as needed for anxiety. (Patient not taking: Reported on 06/15/2022)   amitriptyline (ELAVIL) 50 MG tablet Take 1 tablet (50 mg total) by mouth at bedtime.   amoxicillin-clavulanate (AUGMENTIN) 875-125 MG tablet Take 1 tablet by mouth every 12 (twelve) hours.   aspirin 325 MG tablet Take 325 mg by mouth daily.   azithromycin (ZITHROMAX) 250 MG tablet Take 1 tablet (250 mg total) by mouth daily. Take first 2 tablets together, then 1 every day until finished.   chlorthalidone (HYGROTON) 25 MG tablet Take 25 mg by mouth daily.   Continuous Blood Gluc Sensor (FREESTYLE LIBRE 2 SENSOR) MISC See admin instructions.   diclofenac sodium (VOLTAREN) 1 % GEL Apply 2 g topically 4 (four) times daily as needed for pain.    famotidine (PEPCID) 20 MG tablet Take 1 tablet (20 mg total) by mouth 2 (two) times daily.   gabapentin (NEURONTIN) 300 MG capsule Take 300 mg by mouth 3 (three) times daily.   glimepiride (AMARYL) 2 MG tablet Take 2 mg by mouth 2 (two) times daily as needed (elevated sugar level).   JARDIANCE 25 MG TABS tablet Take 25 mg by mouth daily.   levofloxacin (LEVAQUIN) 750 MG tablet Take 1 tablet (750 mg total) by mouth daily.   metFORMIN (GLUCOPHAGE-XR) 500 MG 24 hr tablet Take 500 mg by mouth daily.   Multiple Vitamins-Minerals (MULTI FOR HER) TABS See admin instructions.   omeprazole (PRILOSEC) 40 MG capsule Take 40 mg by mouth daily as needed (acid reflux/indigestion.).   ondansetron (ZOFRAN) 8 MG tablet Take 1 tablet (8 mg total) by mouth every 8 (eight) hours as needed. (Patient not taking: Reported on 06/15/2022)   pantoprazole (PROTONIX) 20 MG tablet Take 1 tablet (20 mg total) by mouth daily. (Patient not taking: Reported on 06/15/2022)   potassium chloride SA (KLOR-CON) 20 MEQ tablet Take 20 mEq by mouth 2 (two) times daily.   predniSONE (DELTASONE) 50 MG tablet Take 1 tablet (50 mg total) by mouth daily with breakfast.   prochlorperazine (COMPAZINE) 10 MG tablet Take 1 tablet (10 mg total) by mouth every 6 (six) hours as needed for nausea or vomiting. (Patient not taking: Reported on 06/15/2022)   rosuvastatin (CRESTOR) 20 MG tablet Take 20 mg by  mouth daily.    TRULICITY 8.33 AS/5.0NL SOPN Inject 0.75 mg into the skin once a week. Thursday   No facility-administered encounter medications on file as of 08/31/2022.    Allergies as of 08/31/2022 - Review Complete 08/15/2022  Allergen Reaction Noted   Penicillins Hives, Rash, and Other (See Comments) 03/13/2012    Past Medical History:  Diagnosis Date   Anemia 12/22/2012   Anxiety 2012   Arthritis    RA   Bilateral anterior knee pain 2012   Brain tumor ([facility])    Diabetes mellitus without complication ([facility])    GERD (gastroesophageal  reflux disease) 2000   come and go   Headache    As a child   Hyperlipidemia    Hypertension 2008   Panic attack 2012   situational related to move from [place].     Past Surgical History:  Procedure Laterality Date   APPLICATION OF CRANIAL NAVIGATION N/A 07/02/2019   Procedure: APPLICATION OF CRANIAL NAVIGATION;  Surgeon: Judith Part, MD;  Location: [facility];  Service: Neurosurgery;  Laterality: N/A;   CESAREAN SECTION  11/22/2000   MENISCUS REPAIR Left 11/22/2009   L knee for bilateral meniscal tear. Following acute injury.    PARTIAL HYSTERECTOMY  11/23/2007   RETROSIGMOID CRANIECTOMY FOR TUMOR RESECTION Left 07/02/2019   Procedure: Left Retrosigmoid craniotomy for tumor resection;  Surgeon: Judith Part, MD;  Location: [facility];  Service: Neurosurgery;  Laterality: Left;    Family History  Problem Relation Age of Onset   Diabetes Mother    Hypertension Mother    Heart disease Mother 63   Stroke Mother 89       x 2   Alcohol abuse Mother        previous    Drug abuse Mother        previous cocaine    Heart attack Mother    Hypertension Father    Colon cancer Neg Hx    Esophageal cancer Neg Hx    Stomach cancer Neg Hx    Rectal cancer Neg Hx     Social History   Socioeconomic History   Marital status: Single    Spouse name: Not on file   Number of children: 3   Years of education: assoc. 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       epworth sleepiness scale = 3 (02/27/16)       Social Determinants of Health   Financial Resource Strain: Not on file  Food Insecurity: Not on file  Transportation Needs: Not on file  Physical Activity: Not on file  Stress: Not on file  Social Connections: Not on file  Intimate Partner Violence: Not on file    Review of Systems  Respiratory:  Positive for shortness of breath.     There were no vitals filed for  this visit.   Physical Exam Constitutional:      Appearance: She is obese.  HENT:     Head: Normocephalic.     Mouth/Throat:     Mouth: Mucous membranes are moist.  Eyes:     Pupils: Pupils are equal, round, and reactive to light.  Cardiovascular:     Rate and Rhythm: Normal rate and regular rhythm.     Heart sounds: No murmur heard.    No friction rub.  Pulmonary:     Effort: No respiratory distress.     Breath sounds: No stridor. No wheezing or rhonchi.  Musculoskeletal:     Cervical back: No rigidity or tenderness.  Neurological:     Mental Status: She is alert.  Psychiatric:        Mood and Affect: Mood normal.    Data Reviewed: Recent CT scan of the chest reviewed compared with a CT scan that she had done a few years back showing multifocal infiltrate There is also evidence of cardiomegaly and enlarged pulmonary arteries  Last echocardiogram from 2015 did not mention pulmonary hypertension, did show ejection fraction of 50 to 55%  Assessment:  Chest discomfort  Recent pneumonia  Shortness of breath on exertion  Abnormal CT scan of the chest  History of diabetes/reflux  Concern for pulmonary hypertension  Plan/Recommendations: Schedule patient for an echocardiogram to assess pulmonary pressures  Repeat CT scan of the chest already ordered  Continue albuterol as needed  Prescription for Protonix sent to pharmacy  With any cardiac dysfunction, it may be prudent to repeat a sleep study, denies any  significant symptoms at present, not snoring, weight is better than when she had previous study  Graded activities as tolerated  PFT at next visit  Follow-up in 6 weeks     Sherrilyn Rist MD [facility] 08/31/2022, 1:24 PM  CC: Suzy Bouchard, PA*

## 2022-08-31 NOTE — Patient Instructions (Signed)
I will see you in about 6 weeks  PFT at next visit Obtain echocardiogram to assess for pulmonary pressures-shortness of breath on exertion -Depending on finding from the echocardiogram, can decide on further studies  Follow-up on repeat CT scan of the chest  Inhaler use as needed  May need a repeat sleep study-expectation is that with continuing weight loss that the number of events improves  Protonix for reflux  Call with significant concerns

## 2022-09-15 ENCOUNTER — Ambulatory Visit (HOSPITAL_COMMUNITY): Payer: 59 | Attending: Pulmonary Disease

## 2022-09-15 DIAGNOSIS — R002 Palpitations: Secondary | ICD-10-CM | POA: Diagnosis present

## 2022-09-15 DIAGNOSIS — R0609 Other forms of dyspnea: Secondary | ICD-10-CM | POA: Insufficient documentation

## 2022-09-15 LAB — ECHOCARDIOGRAM COMPLETE
Area-P 1/2: 3.63 cm2
S' Lateral: 2.4 cm

## 2022-09-24 ENCOUNTER — Telehealth: Payer: Self-pay | Admitting: Internal Medicine

## 2022-09-24 NOTE — Telephone Encounter (Signed)
Patient states she is returning a call, but she did not receive a voice message so she does not know who called or what it may have been regarding. She assumes the call may be regarding recent echo.

## 2022-09-29 ENCOUNTER — Telehealth: Payer: Self-pay | Admitting: Pulmonary Disease

## 2022-09-29 DIAGNOSIS — R9389 Abnormal findings on diagnostic imaging of other specified body structures: Secondary | ICD-10-CM

## 2022-09-29 DIAGNOSIS — G4733 Obstructive sleep apnea (adult) (pediatric): Secondary | ICD-10-CM

## 2022-09-29 MED ORDER — ALBUTEROL SULFATE HFA 108 (90 BASE) MCG/ACT IN AERS: 1.0000 | INHALATION_SPRAY | Freq: Four times a day (QID) | RESPIRATORY_TRACT | 3 refills | Status: AC | PRN

## 2022-09-29 NOTE — Telephone Encounter (Signed)
ATC patient. LVMTCB. 

## 2022-09-29 NOTE — Telephone Encounter (Signed)
Instructions  I will see you in about 6 weeks   PFT at next visit Obtain echocardiogram to assess for pulmonary pressures-shortness of breath on exertion -Depending on finding from the echocardiogram, can decide on further studies   Follow-up on repeat CT scan of the chest   Inhaler use as needed   May need a repeat sleep study-expectation is that with continuing weight loss that the number of events improves   Protonix for reflux   Call with significant concerns        Pt called the office stating that she is still waiting to know the results of her echo. States that she has been waiting for at least 2 weeks to hear of the results. Pt is still having problems with palpitations and wants to know what the echo showed.  Pt also states that she believes she needs to have a sleep study performed. Dr. Jenetta Downer, please advise on this for pt.

## 2022-09-30 NOTE — Telephone Encounter (Signed)
Spoke with patient to let her know her ECHO was ordered by Dr. Ander Slade and she needs to contact his office for the results. She is schedule with Dr.Kenilworth on 10/18/2022 and advised they can further discuss the results at that time.  Patient verbalized understanding and was appreciative of update.

## 2022-10-01 NOTE — Telephone Encounter (Signed)
Echocardiogram reviewed  -Echocardiogram is within normal limits    Schedule for in lab sleep study please  -Previous sleep study from 2014 showed mild obstructive sleep apnea

## 2022-10-01 NOTE — Telephone Encounter (Signed)
Spoke with pt  and reviewed Echo results as dictated by Dr. Ander Slade. Pt stated understanding but is concerned that she is still having pain in chest and SOB. Pt would also like to know if it is time for her CT to see if her pneumonia has resolved.   Would you like the sleep study to be a split night or a NPSA?  Dr. Ander Slade please advise.

## 2022-10-04 NOTE — Telephone Encounter (Signed)
AO routed to procedure pool.  Carolyn Holland has routed to [facility] requesting what type of sleep study order to be placed.

## 2022-10-08 NOTE — Telephone Encounter (Signed)
Schedule in lab split-night study  Schedule CT scan of the chest without contrast-indication is abnormal CT with pneumonia

## 2022-10-08 NOTE — Telephone Encounter (Signed)
Spoke to pt and informed her that the orders were placed for in lab split-night study and CT wo contrast to rule out pneumonia. Patient verbalized understanding. Nothing further needed.

## 2022-10-09 ENCOUNTER — Other Ambulatory Visit: Payer: Self-pay

## 2022-10-13 ENCOUNTER — Ambulatory Visit: Payer: 59 | Admitting: Pulmonary Disease

## 2022-10-18 ENCOUNTER — Ambulatory Visit (INDEPENDENT_AMBULATORY_CARE_PROVIDER_SITE_OTHER): Payer: 59 | Admitting: Cardiovascular Disease

## 2022-10-18 ENCOUNTER — Encounter (HOSPITAL_BASED_OUTPATIENT_CLINIC_OR_DEPARTMENT_OTHER): Payer: Self-pay | Admitting: Cardiovascular Disease

## 2022-10-18 VITALS — BP 120/76 | HR 84 | Wt 245.4 lb

## 2022-10-18 DIAGNOSIS — R0789 Other chest pain: Secondary | ICD-10-CM

## 2022-10-18 DIAGNOSIS — R079 Chest pain, unspecified: Secondary | ICD-10-CM | POA: Diagnosis not present

## 2022-10-18 DIAGNOSIS — I10 Essential (primary) hypertension: Secondary | ICD-10-CM | POA: Diagnosis not present

## 2022-10-18 DIAGNOSIS — G4733 Obstructive sleep apnea (adult) (pediatric): Secondary | ICD-10-CM | POA: Diagnosis not present

## 2022-10-18 DIAGNOSIS — I288 Other diseases of pulmonary vessels: Secondary | ICD-10-CM

## 2022-10-18 MED ORDER — METOPROLOL TARTRATE 100 MG PO TABS: ORAL_TABLET | ORAL | 0 refills | Status: AC

## 2022-10-18 NOTE — Assessment & Plan Note (Addendum)
Her chest pain is atypical.  I'm suspicious of pericarditis/pleurisy and radicular pain.  She is also taking aspirin and daily NSAIDS.  Recommend that she switch to Tylenol.  We will get a coronary CT-A given that she has struggled with chest pain for years.  We will get a BMP and check an ESR.

## 2022-10-18 NOTE — Assessment & Plan Note (Signed)
Blood pressure was initially elevated but better on repeat. Continue chlorthalidone.

## 2022-10-18 NOTE — Progress Notes (Signed)
Cardiology Office Note:    Date:  11/03/2022   ID:  Carolyn Holland, DOB 1971-02-28, MRN [id number]  PCP:  [facility] Prov[facility] Cardiologist:  Pixie Casino, MD     Referring MD: Karleen Hampshire., MD   No chief complaint on file.   History of Present Illness:    Carolyn Holland is a 51 y.o. female with hypertension, hyperlipidemia, diabetes mellitus, and anxiety who is being seen today for the evaluation of enlarged pulmonary artery at the request of Rozetta Nunnery, Cleone Slim., MD.  She saw cardiology at 2018 for an ED visit of chest pain. She had palpitations that were thought to be due to anxiety. She had a nuclear stress in 2017 that was negative. She wore a Holter monitor in 2019 that showed negative PVC's and sinus arrhythmia but no significant findings. She was treated for pneumonia 9/23. CT showed enlargement of the pulmonary artery and was referred to cardiology for evaluation. She also continued to complain of chest pain and palpitations that would wake her up at night. Her pulmonary artery was not enlarged prior to CT on 7/22.  She has been experiencing constant chest pain and shortness of breath since her pneumonia diagnosis. She notices that the chest pain occurs upon exertion or even sitting or laying down. The chest pain localizes either on the center or on the left side of her chest. She does take ibuprofen PRN which only lasts for 7hrs, which she does find helpful. Whenever she is inhaling, she experiences the chest pain, which she described it as either a sharp or burning sensation. She does experience shortness of breath through exertion. When this occurs, she does take her inhaler. She also has some occasional tingling on her left arm. She endorsed experiencing bloating when she eats and have to take a stool supplement for constipation. In regards to her family history, her mother had a heart attack and two strokes along with a history of  alcoholism and drug substance abuse. Her grandmother had a history of dyslipidemia. She denies any tobacco usage but occasionally smokes marijuana. She has a history of rectal cancer a year ago, which she underwent surgery for.   Past Medical History:  Diagnosis Date   Anemia 12/22/2012   Anxiety 2012   Arthritis    RA   Bilateral anterior knee pain 2012   Brain tumor ([place])    Diabetes mellitus without complication ([employer])    Enlarged pulmonary artery ([place]) 11/03/2022   GERD (gastroesophageal reflux disease) 2000   come and go   Headache    As a child   Hyperlipidemia    Panic attack 2012   situational related to move from [place].     Past Surgical History:  Procedure Laterality Date   APPLICATION OF CRANIAL NAVIGATION N/A 07/02/2019   Procedure: APPLICATION OF CRANIAL NAVIGATION;  Surgeon: Judith Part, MD;  Location: [facility];  Service: Neurosurgery;  Laterality: N/A;   CESAREAN SECTION  11/22/2000   MENISCUS REPAIR Left 11/22/2009   L knee for bilateral meniscal tear. Following acute injury.    PARTIAL HYSTERECTOMY  11/23/2007   RETROSIGMOID CRANIECTOMY FOR TUMOR RESECTION Left 07/02/2019   Procedure: Left Retrosigmoid craniotomy for tumor resection;  Surgeon: Judith Part, MD;  Location: [facility];  Service: Neurosurgery;  Laterality: Left;    Current Medications: Current Meds  Medication Sig   albuterol (VENTOLIN HFA) 108 (90 Base) MCG/ACT inhaler Inhale 1 puff  into the lungs every 6 (six) hours as needed for wheezing or shortness of breath.   ALPRAZolam (XANAX) 0.5 MG tablet Take 0.5 mg by mouth 3 (three) times daily as needed for anxiety.   aspirin 325 MG tablet Take 325 mg by mouth daily.   chlorthalidone (HYGROTON) 25 MG tablet Take 25 mg by mouth daily.   Continuous Blood Gluc Sensor (FREESTYLE LIBRE 2 SENSOR) MISC See admin instructions.   diclofenac sodium (VOLTAREN) 1 % GEL Apply 2 g topically 4 (four) times daily as needed for pain.   famotidine  (PEPCID) 20 MG tablet Take 1 tablet (20 mg total) by mouth 2 (two) times daily.   JARDIANCE 25 MG TABS tablet Take 25 mg by mouth daily.   metFORMIN (GLUCOPHAGE-XR) 500 MG 24 hr tablet Take 500 mg by mouth daily.   metoprolol tartrate (LOPRESSOR) 100 MG tablet TAKE 1 TABLET 2 HOURS PRIOR TO CT   Multiple Vitamins-Minerals (MULTI FOR HER) TABS See admin instructions.   omeprazole (PRILOSEC) 40 MG capsule Take 40 mg by mouth daily as needed (acid reflux/indigestion.).   ondansetron (ZOFRAN) 8 MG tablet Take 1 tablet (8 mg total) by mouth every 8 (eight) hours as needed.   OZEMPIC, 0.25 OR 0.5 MG/DOSE, 2 MG/3ML SOPN Inject 0.5 mg into the skin once a week.   pantoprazole (PROTONIX) 40 MG tablet Take 1 tablet (40 mg total) by mouth daily.   potassium chloride SA (KLOR-CON) 20 MEQ tablet Take 20 mEq by mouth 2 (two) times daily.   rosuvastatin (CRESTOR) 20 MG tablet Take 20 mg by mouth daily.      Allergies:   Penicillins   Social History   Socioeconomic History   Marital status: Single    Spouse name: Not on file   Number of children: 3   Years of education: assoc. deg   Highest education level: Not on file  Occupational History   Occupation: Facilities manager: [employer]   Occupation: Charity fundraiser: Theme park manager  Tobacco Use   Smoking status: Never   Smokeless tobacco: Never  Vaping Use   Vaping Use: Never used  Substance and Sexual Activity   Alcohol use: Yes    Comment: occassional, social, wine   Drug use: No   Sexual activity: Not Currently    Partners: Male    Birth control/protection: Condom, Surgical  Other Topics Concern   Not on file  Social History Narrative   Live with two children 16 and 11.       epworth sleepiness scale = 3 (02/27/16)       Social Determinants of Health   Financial Resource Strain: Not on file  Food Insecurity: No Food Insecurity (10/18/2022)   Hunger Vital Sign    Worried About Running Out of Food in  the Last Year: Never true    [place] in the Last Year: Never true  Transportation Needs: No Transportation Needs (10/18/2022)   PRAPARE - Hydrologist (Medical): No    Lack of Transportation (Non-Medical): No  Physical Activity: Inactive (10/18/2022)   Exercise Vital Sign    Days of Exercise per Week: 0 days    Minutes of Exercise per Session: 0 min  Stress: Not on file  Social Connections: Not on file     Family History: The patient's family history includes Alcohol abuse in her mother; Diabetes in her mother; Drug abuse in her mother; Heart attack  in her mother; Heart disease (age of onset: 70) in her mother; Hyperlipidemia in her maternal grandmother; Hypertension in her father and mother; Stroke (age of onset: 19) in her mother. There is no history of Colon cancer, Esophageal cancer, Stomach cancer, or Rectal cancer.  ROS:   Please see the history of present illness.    (+) Shortness of breath (+) Tingling of her left arm (+) Chest pain (+) Bloating (+) Constipation All other systems reviewed and are negative.  EKGs/Labs/Other Studies Reviewed:    The following studies were reviewed today:  Echo 09/15/22: IMPRESSIONS   1. Left ventricular ejection fraction, by estimation, is 55 to 60%. Left  ventricular ejection fraction by 3D volume is 59 %. The left ventricle has  normal function. The left ventricle has no regional wall motion  abnormalities. There is severe asymmetric   left ventricular hypertrophy of the basal-septal segment. Left  ventricular diastolic parameters are consistent with Grade I diastolic  dysfunction (impaired relaxation). The average left ventricular global  longitudinal strain is -19.6 %. The global  longitudinal strain is normal. There is chordal systolic anterior mortion  of the mitral valve apparatus without LVOT obstruction.   2. Right ventricular systolic function is normal. The right ventricular  size is  normal. There is normal pulmonary artery systolic pressure.   3. The mitral valve is normal in structure. No evidence of mitral valve  regurgitation. No evidence of mitral stenosis.   4. The aortic valve is tricuspid. Aortic valve regurgitation is not  visualized.   Chest X-ray 08/15/22: FINDINGS: Stable cardiomediastinal silhouette with normal heart size. No pneumothorax. No pleural effusion. No pulmonary edema. Patchy streaky opacities at the left greater than right lung bases have improved from prior chest radiograph.   IMPRESSION: Patchy streaky opacities at the left greater than right lung bases have improved from prior chest radiograph, most compatible with improving pneumonia.  EKG:  10/18/22: 08/15/22: Sinus rhythm. Rate 81 bpm.  Recent Labs: 04/15/2022: B Natriuretic Peptide 6.0 08/15/2022: ALT 15; Hemoglobin 15.3; Platelets 244 10/21/2022: BUN 11; Creatinine, Ser 0.86; Potassium 3.6; Sodium 141  Recent Lipid Panel    Component Value Date/Time   CHOL 200 02/12/2016 1004   TRIG 54.0 02/12/2016 1004   HDL 68.90 02/12/2016 1004   CHOLHDL 3 02/12/2016 1004   VLDL 10.8 02/12/2016 1004   LDLCALC 120 (H) 02/12/2016 1004     Risk Assessment/Calculations:      Physical Exam:    VS:  BP 120/76 (BP Location: Right Arm, Patient Position: Sitting, Cuff Size: Large)   Pulse 84   Wt 245 lb 6.4 oz (111.3 kg)   SpO2 93%   BMI 39.61 kg/m  , BMI Body mass index is 39.61 kg/m. GENERAL:  Well appearing HEENT: Pupils equal round and reactive, fundi not visualized, oral mucosa unremarkable NECK:  No jugular venous distention, waveform within normal limits, carotid upstroke brisk and symmetric, no bruits, no thyromegaly LUNGS:  Clear to auscultation bilaterally HEART:  RRR.  PMI not displaced or sustained,S1 and S2 within normal limits, no S3, no S4, no clicks, no rubs, no murmurs ABD:  Flat, positive bowel sounds normal in frequency in pitch, no bruits, no rebound, no guarding,  no midline pulsatile mass, no hepatomegaly, no splenomegaly EXT:  2 plus pulses throughout, no edema, no cyanosis no clubbing SKIN:  No rashes no nodules NEURO:  Cranial nerves II through XII grossly intact, motor grossly intact throughout PSYCH:  Cognitively intact, oriented to person place and  time   ASSESSMENT:    1. Essential hypertension   2. Chest pain of uncertain etiology   3. Atypical chest pain   4. Obstructive sleep apnea   5. Enlarged pulmonary artery (HCC)    PLAN:    In order of problems listed above:  Essential hypertension Blood pressure was initially elevated but better on repeat. Continue chlorthalidone.  Atypical chest pain Her chest pain is atypical.  I'm suspicious of pericarditis/pleurisy and radicular pain.  She is also taking aspirin and daily NSAIDS.  Recommend that she switch to Tylenol.  We will get a coronary CT-A given that she has struggled with chest pain for years.  We will get a BMP and check an ESR.  Obstructive sleep apnea Continue working on weight loss.  Enlarged pulmonary artery (HCC) Pulmonary artery enlargement was noted on chest CT.  However this was at the time of a pneumonia diagnosis.  Subsequent echocardiogram revealed no evidence of pulmonary hypertension.  No further workup indicated at this time.        Medication Adjustments/Labs and Tests Ordered: Current medicines are reviewed at length with the patient today.  Concerns regarding medicines are outlined above.  Orders Placed This Encounter  Procedures   CT CORONARY MORPH W/CTA COR W/SCORE W/CA W/CM &/OR WO/CM   Sedimentation rate   Basic Metabolic Panel (BMET)   Meds ordered this encounter  Medications   metoprolol tartrate (LOPRESSOR) 100 MG tablet    Sig: TAKE 1 TABLET 2 HOURS PRIOR TO CT    Dispense:  1 tablet    Refill:  0    Patient Instructions  Medication Instructions:  TAKE METOPROLOL 100 MG 2 HOURS PRIOR TO CT   *If you need a refill on your cardiac  medications before your next appointment, please call your pharmacy*   Lab Work: BMET/SED RATE SOON   If you have labs (blood work) drawn today and your tests are completely normal, you will receive your results only by: MyChart Message (if you have MyChart) OR A paper copy in the mail If you have any lab test that is abnormal or we need to change your treatment, we will call you to review the results.   Testing/Procedures: Your physician has requested that you have cardiac CT. Cardiac computed tomography (CT) is a painless test that uses an x-ray machine to take clear, detailed pictures of your heart. For further information please visit HugeFiesta.[email or web address]. Please follow instruction sheet as given.  Follow-Up: At [facility], you and your health needs are our priority.  As part of our continuing mission to provide you with exceptional heart care, we have created designated Provider Care Teams.  These Care Teams include your primary Cardiologist (physician) and Advanced Practice Providers (APPs -  Physician Assistants and Nurse Practitioners) who all work together to provide you with the care you need, when you need it.  We recommend signing up for the patient portal called "MyChart".  Sign up information is provided on this After Visit Summary.  MyChart is used to connect with patients for Virtual Visits (Telemedicine).  Patients are able to view lab/test results, encounter notes, upcoming appointments, etc.  Non-urgent messages can be sent to your provider as well.   To learn more about what you can do with MyChart, go to [email or web address].    Your next appointment:   2 month(s)  The format for your next appointment:   In Person  Provider:   Skeet Latch, MD or Laurann Montana, NP  Other Instructions   Your cardiac CT will be scheduled at one of the below locations:   [facility] [address] [place], [place] [address] [phone]  OR  [facility] [address] [place], [place] [address] [phone]  [facility] [place], [place] [address] [phone]  If scheduled at [facility], please arrive at the [facility] and Children's Entrance (Entrance C2) of [facility] 30 minutes prior to test start time. You can use the FREE valet parking offered at entrance C (encouraged to control the heart rate for the test)  Proceed to the [facility] Radiology Department (first floor) to check-in and test prep.  All radiology patients and guests should use entrance C2 at [facility], accessed from [facility], even though the hospital's physical address listed is [address].    If scheduled at [facility] or [facility], please arrive 15 mins early for check-in and test prep.   Please follow these instructions carefully (unless otherwise directed):  On the Night Before the Test: Be sure to Drink plenty of water. Do not consume any caffeinated/decaffeinated beverages or chocolate 12 hours prior to your test. Do not take any antihistamines 12 hours prior to your test.  On the Day of the Test: Drink plenty of water until 1 hour prior to the test. Do not eat any food 1 hour prior to test. You may take your regular medications prior to the test.  Take metoprolol (Lopressor) two hours prior to test. HOLD Furosemide/Hydrochlorothiazide morning of the test. FEMALES- please wear underwire-free bra if available, avoid dresses & tight clothing      After the Test: Drink plenty of water. After receiving IV contrast, you may experience a mild flushed feeling. This is normal. On occasion, you may experience a mild rash up to 24 hours after the test. This is not dangerous. If this occurs, you can take Benadryl 25 mg and increase your fluid  intake. If you experience trouble breathing, this can be serious. If it is severe call 911 IMMEDIATELY. If it is mild, please call our office. If you take any of these medications: Glipizide/Metformin, Avandament, Glucavance, please do not take 48 hours after completing test unless otherwise instructed.  We will call to schedule your test 2-4 weeks out understanding that some insurance companies will need an authorization prior to the service being performed.   For non-scheduling related questions, please contact the cardiac imaging nurse navigator should you have any questions/concerns: Marchia Bond, Cardiac Imaging Nurse Navigator Gordy Clement, Cardiac Imaging Nurse Navigator [facility] Direct Office Dial: [phone]   For scheduling needs, including cancellations and rescheduling, please call [place], [phone].           I,Danny Valdes,acting as a Education administrator for [place], MD.,have documented all relevant documentation on the behalf of Skeet Latch, MD,as directed by  Skeet Latch, MD while in the presence of Skeet Latch, MD.  I, Troy Oval Linsey, MD have reviewed all documentation for this visit.  The documentation of the exam, diagnosis, procedures, and orders on 11/03/2022 are all accurate and complete.   Signed, Skeet Latch, MD  11/03/2022 10:37 AM    [place]

## 2022-10-18 NOTE — Patient Instructions (Signed)
Medication Instructions:  TAKE METOPROLOL 100 MG 2 HOURS PRIOR TO CT   *If you need a refill on your cardiac medications before your next appointment, please call your pharmacy*   Lab Work: BMET/SED RATE SOON   If you have labs (blood work) drawn today and your tests are completely normal, you will receive your results only by: [place] (if you have MyChart) OR A paper copy in the mail If you have any lab test that is abnormal or we need to change your treatment, we will call you to review the results.   Testing/Procedures: Your physician has requested that you have cardiac CT. Cardiac computed tomography (CT) is a painless test that uses an x-ray machine to take clear, detailed pictures of your heart. For further information please visit HugeFiesta.tn. Please follow instruction sheet as given.  Follow-Up: At [facility], you and your health needs are our priority.  As part of our continuing mission to provide you with exceptional heart care, we have created designated Provider Care Teams.  These Care Teams include your primary Cardiologist (physician) and Advanced Practice Providers (APPs -  Physician Assistants and Nurse Practitioners) who all work together to provide you with the care you need, when you need it.  We recommend signing up for the patient portal called "MyChart".  Sign up information is provided on this After Visit Summary.  MyChart is used to connect with patients for Virtual Visits (Telemedicine).  Patients are able to view lab/test results, encounter notes, upcoming appointments, etc.  Non-urgent messages can be sent to your provider as well.   To learn more about what you can do with MyChart, go to [email or web address].    Your next appointment:   2 month(s)  The format for your next appointment:   In Person  Provider:   Skeet Latch, MD or Laurann Montana, NP    Other Instructions   Your cardiac CT will be scheduled at one of the  below locations:   [facility] [address] [place], [place] [address] [phone]  [facility] [address] [place], [place] [address] [phone]  [facility] [place], [place] [address] [phone]  If scheduled at [facility], please arrive at the [facility] and Children's Entrance (Entrance C2) of [facility] 30 minutes prior to test start time. You can use the FREE valet parking offered at entrance C (encouraged to control the heart rate for the test)  Proceed to the [facility] Radiology Department (first floor) to check-in and test prep.  All radiology patients and guests should use entrance C2 at [facility], accessed from [facility], even though the hospital's physical address listed is [address]    If scheduled at [facility] or [facility], please arrive 15 mins early for check-in and test prep.   Please follow these instructions carefully (unless otherwise directed):  On the Night Before the Test: Be sure to Drink plenty of water. Do not consume any caffeinated/decaffeinated beverages or chocolate 12 hours prior to your test. Do not take any antihistamines 12 hours prior to your test.  On the Day of the Test: Drink plenty of water until 1 hour prior to the test. Do not eat any food 1 hour prior to test. You may take your regular medications prior to the test.  Take metoprolol (Lopressor) two  hours prior to test. HOLD Furosemide/Hydrochlorothiazide morning of the test. FEMALES- please wear underwire-free bra if available, avoid dresses & tight clothing      After the Test: Drink plenty of water. After receiving IV contrast, you may experience a mild flushed feeling. This is normal. On occasion, you may experience a mild rash up to 24 hours after the  test. This is not dangerous. If this occurs, you can take Benadryl 25 mg and increase your fluid intake. If you experience trouble breathing, this can be serious. If it is severe call 911 IMMEDIATELY. If it is mild, please call our office. If you take any of these medications: Glipizide/Metformin, Avandament, Glucavance, please do not take 48 hours after completing test unless otherwise instructed.  We will call to schedule your test 2-4 weeks out understanding that some insurance companies will need an authorization prior to the service being performed.   For non-scheduling related questions, please contact the cardiac imaging nurse navigator should you have any questions/concerns: Marchia Bond, Cardiac Imaging Nurse Navigator Gordy Clement, Cardiac Imaging Nurse Navigator [facility] Direct Office Dial: [phone]   For scheduling needs, including cancellations and rescheduling, please call [place], [phone].

## 2022-10-19 ENCOUNTER — Other Ambulatory Visit: Payer: Self-pay

## 2022-10-21 ENCOUNTER — Ambulatory Visit (HOSPITAL_COMMUNITY): Admission: RE | Admit: 2022-10-21 | Payer: 59 | Source: Ambulatory Visit

## 2022-10-21 ENCOUNTER — Telehealth (HOSPITAL_BASED_OUTPATIENT_CLINIC_OR_DEPARTMENT_OTHER): Payer: Self-pay | Admitting: Cardiovascular Disease

## 2022-10-21 NOTE — Telephone Encounter (Signed)
Spoke with patient regarding the Thursday 10/28/22 12:30 pm Cardiac CT appointment at [facility]---arrival time 12:00 pm 1st floor radiology for check in.  --informed patient the nurse navigator will call with instructions and also answer any questions she may have.  She voiced her understanding

## 2022-10-22 ENCOUNTER — Ambulatory Visit (HOSPITAL_COMMUNITY): Payer: 59

## 2022-10-22 ENCOUNTER — Encounter: Payer: Self-pay | Admitting: Hematology and Oncology

## 2022-10-22 LAB — BASIC METABOLIC PANEL
BUN/Creatinine Ratio: 13 (ref 9–23)
BUN: 11 mg/dL (ref 6–24)
CO2: 26 mmol/L (ref 20–29)
Calcium: 9.9 mg/dL (ref 8.7–10.2)
Chloride: 100 mmol/L (ref 96–106)
Creatinine, Ser: 0.86 mg/dL (ref 0.57–1.00)
Glucose: 136 mg/dL — ABNORMAL HIGH (ref 70–99)
Potassium: 3.6 mmol/L (ref 3.5–5.2)
Sodium: 141 mmol/L (ref 134–144)
eGFR: 81 mL/min/{1.73_m2} (ref >59)

## 2022-10-22 LAB — SEDIMENTATION RATE: Sed Rate: 8 mm/hr (ref 0–40)

## 2022-10-26 ENCOUNTER — Ambulatory Visit (HOSPITAL_COMMUNITY): Payer: 59

## 2022-10-27 ENCOUNTER — Telehealth (HOSPITAL_COMMUNITY): Payer: Self-pay | Admitting: Emergency Medicine

## 2022-10-27 NOTE — Telephone Encounter (Signed)
Reaching out to patient to offer assistance regarding upcoming cardiac imaging study; pt verbalizes understanding of appt date/time, parking situation and where to check in, pre-test NPO status and medications ordered, and verified current allergies; name and call back number provided for further questions should they arise Marchia Bond RN Navigator [facility] [phone] office [phone] cell  Pt taking home xanax for scan '100mg'$  metoprolol tartrate Arrival 1200 Aware contrast/nitro\

## 2022-10-28 ENCOUNTER — Telehealth (HOSPITAL_BASED_OUTPATIENT_CLINIC_OR_DEPARTMENT_OTHER): Payer: Self-pay | Admitting: *Deleted

## 2022-10-28 ENCOUNTER — Ambulatory Visit (HOSPITAL_COMMUNITY)
Admission: RE | Admit: 2022-10-28 | Discharge: 2022-10-28 | Disposition: A | Discharge status: Home / Self Care | Payer: 59 | Source: Ambulatory Visit | Attending: Cardiovascular Disease | Admitting: Cardiovascular Disease

## 2022-10-28 ENCOUNTER — Encounter (HOSPITAL_COMMUNITY): Payer: Self-pay | Admitting: Cardiovascular Disease

## 2022-10-28 DIAGNOSIS — I10 Essential (primary) hypertension: Secondary | ICD-10-CM | POA: Insufficient documentation

## 2022-10-28 DIAGNOSIS — R0789 Other chest pain: Secondary | ICD-10-CM

## 2022-10-28 DIAGNOSIS — R079 Chest pain, unspecified: Secondary | ICD-10-CM | POA: Diagnosis present

## 2022-10-28 MED ORDER — NITROGLYCERIN 0.4 MG SL SUBL: 0.8000 mg | SUBLINGUAL_TABLET | Freq: Once | SUBLINGUAL | Status: DC

## 2022-10-28 MED ORDER — DILTIAZEM HCL 25 MG/5ML IV SOLN: 5.0000 mg | INTRAVENOUS | Status: DC | PRN

## 2022-10-28 MED ORDER — METOPROLOL TARTRATE 5 MG/5ML IV SOLN
INTRAVENOUS | Status: AC
  Administered 2022-10-28: 10 mg via INTRAVENOUS
  Filled 2022-10-28: qty 15

## 2022-10-28 MED ORDER — DILTIAZEM HCL 25 MG/5ML IV SOLN
INTRAVENOUS | Status: AC
  Administered 2022-10-28: 5 mg via INTRAVENOUS
  Filled 2022-10-28: qty 5

## 2022-10-28 MED ORDER — METOPROLOL TARTRATE 5 MG/5ML IV SOLN: 5.0000 mg | INTRAVENOUS | Status: DC | PRN

## 2022-10-28 NOTE — Telephone Encounter (Signed)
Dr. Oval Linsey. This patient came in today for her CT scan and was aborted. She took '100mg'$  metop and came in with her HR in the 80's. Would you like for [place] to reschedule her with different meds. Perhaps '100mg'$  metop and 10-'15mg'$  ivabradine?  "Pt's heart rate remains in the 80s even with IV metoprolol and diltiazem given. Pt also reports taking PO metoprolol as instructed. Dr. Marisue Ivan notified. Per MD, cancel scan at this time. Pt informed and verbalized understanding. IV removed and discharged home. CT navigator and technologist notified. "  This was the note from the IR nurses   Above received from [place]   Per Dr Oval Linsey get Carolyn Holland

## 2022-10-28 NOTE — Progress Notes (Signed)
Pt's heart rate remains in the 80s even with IV metoprolol and diltiazem given. Pt also reports taking PO metoprolol as instructed. Dr. Marisue Ivan notified. Per MD, cancel scan at this time. Pt informed and verbalized understanding. IV removed and discharged home. CT navigator and technologist notified.

## 2022-10-28 NOTE — Telephone Encounter (Signed)
Left message to call back  

## 2022-11-03 ENCOUNTER — Telehealth (HOSPITAL_COMMUNITY): Payer: Self-pay | Admitting: *Deleted

## 2022-11-03 ENCOUNTER — Encounter (HOSPITAL_BASED_OUTPATIENT_CLINIC_OR_DEPARTMENT_OTHER): Payer: Self-pay | Admitting: Cardiovascular Disease

## 2022-11-03 DIAGNOSIS — I288 Other diseases of pulmonary vessels: Secondary | ICD-10-CM | POA: Insufficient documentation

## 2022-11-03 HISTORY — DX: Other diseases of pulmonary vessels: I28.8

## 2022-11-03 NOTE — Telephone Encounter (Signed)
Spoke to pt and gave instructions for MPI study on 11/04/22.

## 2022-11-03 NOTE — Assessment & Plan Note (Signed)
Continue working on weight loss.

## 2022-11-03 NOTE — Assessment & Plan Note (Signed)
Pulmonary artery enlargement was noted on chest CT.  However this was at the time of a pneumonia diagnosis.  Subsequent echocardiogram revealed no evidence of pulmonary hypertension.  No further workup indicated at this time.

## 2022-11-04 ENCOUNTER — Ambulatory Visit (HOSPITAL_COMMUNITY): Payer: 59 | Attending: Cardiology

## 2022-11-04 VITALS — Ht 66.0 in | Wt 245.0 lb

## 2022-11-04 DIAGNOSIS — R110 Nausea: Secondary | ICD-10-CM | POA: Diagnosis present

## 2022-11-04 DIAGNOSIS — R0789 Other chest pain: Secondary | ICD-10-CM | POA: Diagnosis present

## 2022-11-04 MED ORDER — TECHNETIUM TC 99M TETROFOSMIN IV KIT
33.0000 | PACK | Freq: Once | INTRAVENOUS | Status: AC | PRN
  Administered 2022-11-04: 33 via INTRAVENOUS

## 2022-11-04 MED ORDER — REGADENOSON 0.4 MG/5ML IV SOLN
0.4000 mg | Freq: Once | INTRAVENOUS | Status: AC
  Administered 2022-11-04: 0.4 mg via INTRAVENOUS

## 2022-11-04 MED ORDER — AMINOPHYLLINE 25 MG/ML IV SOLN
75.0000 mg | Freq: Once | INTRAVENOUS | Status: AC
  Administered 2022-11-04: 75 mg via INTRAVENOUS

## 2022-11-05 ENCOUNTER — Ambulatory Visit (HOSPITAL_COMMUNITY): Payer: 59 | Attending: Cardiology

## 2022-11-05 LAB — MYOCARDIAL PERFUSION IMAGING
LV dias vol: 67 mL (ref 46–106)
LV sys vol: 24 mL
Nuc Stress EF: 64 %
Peak HR: 121 {beats}/min
Rest HR: 96 {beats}/min
Rest Nuclear Isotope Dose: 32 mCi
SDS: 0
SRS: 0
SSS: 0
ST Depression (mm): 0 mm
Stress Nuclear Isotope Dose: 33 mCi
TID: 0.74

## 2022-11-05 MED ORDER — TECHNETIUM TC 99M TETROFOSMIN IV KIT
32.0000 | PACK | Freq: Once | INTRAVENOUS | Status: AC | PRN
  Administered 2022-11-05: 32 via INTRAVENOUS

## 2022-11-17 ENCOUNTER — Ambulatory Visit (INDEPENDENT_AMBULATORY_CARE_PROVIDER_SITE_OTHER): Payer: 59 | Admitting: Pulmonary Disease

## 2022-11-17 ENCOUNTER — Encounter: Payer: Self-pay | Admitting: Pulmonary Disease

## 2022-11-17 VITALS — BP 122/84 | HR 84 | Temp 98.2°F | Ht 66.0 in | Wt 233.8 lb

## 2022-11-17 DIAGNOSIS — R0609 Other forms of dyspnea: Secondary | ICD-10-CM | POA: Diagnosis not present

## 2022-11-17 DIAGNOSIS — G4733 Obstructive sleep apnea (adult) (pediatric): Secondary | ICD-10-CM | POA: Diagnosis not present

## 2022-11-17 LAB — PULMONARY FUNCTION TEST
DL/VA % pred: 90 %
DL/VA: 3.82 ml/min/mmHg/L
DLCO cor % pred: 53 %
DLCO cor: 12.03 ml/min/mmHg
DLCO unc % pred: 58 %
DLCO unc: 13.01 ml/min/mmHg
FEF 25-75 Pre: 1.89 L/sec
FEF2575-%Pred-Pre: 67 %
FEV1-%Pred-Pre: 49 %
FEV1-Pre: 1.46 L
FEV1FVC-%Pred-Pre: 108 %
FEV6-%Pred-Pre: 45 %
FEV6-Pre: 1.64 L
FEV6FVC-%Pred-Pre: 102 %
FVC-%Pred-Pre: 44 %
FVC-Pre: 1.68 L
Pre FEV1/FVC ratio: 87 %
Pre FEV6/FVC Ratio: 100 %
RV % pred: -51 %
RV: -1 L
TLC % pred: 55 %
TLC: 2.95 L

## 2022-11-17 NOTE — Patient Instructions (Signed)
Hypersensitivity pneumonitis panel  CBC and differentials  IgE levels  High-resolution CT scan of the chest  Sleep study   Possibility of doing a bronchoscopy following CT scan but CT scan needed prior to scheduling a bronchoscopy as this will guide what segment of the lung to focus on  Continue current pain management  Continue using inhalers as needed  I will see you back in about 6 weeks

## 2022-11-17 NOTE — Patient Instructions (Addendum)
Spirometry/DLCO and Pleth performed today.

## 2022-11-17 NOTE — Progress Notes (Signed)
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  She notes she may have been exposed to black mold, did show me pictures of mold on the walls of one of her clients  Continues to have significant shortness of breath, tightness of the chest, exercise intolerance, dyspepsia  She feels symptoms are directly associated with mold exposure  History of hypertension, diabetes, hypercholesterolemia, rectal cancer for which she had surgery  Never cigarette smoker, socially smoked weed-very infrequent  Works in healthcare  No history of heart disease, no history of breathing problems in the past  Was evaluated with a sleep study when she was considering bariatric surgery found to have mild obstructive sleep apnea  No history of leg swelling  History of meningioma History of TIA  Outpatient Encounter Medications as of 11/17/2022  Medication Sig   albuterol (VENTOLIN HFA) 108 (90 Base) MCG/ACT inhaler Inhale 1 puff into the lungs every 6 (six) hours as needed for wheezing or shortness of breath.   ALPRAZolam (XANAX) 0.5 MG tablet Take 0.5 mg by mouth 3 (three) times daily as needed for anxiety.   aspirin 325 MG tablet Take 325 mg by mouth daily.   chlorthalidone (HYGROTON) 25 MG tablet Take 25 mg by mouth daily.   Continuous Blood Gluc Sensor (FREESTYLE LIBRE 2 SENSOR) MISC See admin instructions.   diclofenac sodium (VOLTAREN) 1 % GEL Apply 2 g topically 4 (four) times daily as needed for pain.   famotidine (PEPCID) 20 MG tablet Take 1 tablet (20 mg total) by mouth 2 (two) times daily.   JARDIANCE 25 MG TABS tablet Take 25 mg by mouth daily.   metFORMIN (GLUCOPHAGE-XR)  500 MG 24 hr tablet Take 500 mg by mouth daily.   metoprolol tartrate (LOPRESSOR) 100 MG tablet TAKE 1 TABLET 2 HOURS PRIOR TO CT   Multiple Vitamins-Minerals (MULTI FOR HER) TABS See admin instructions.   omeprazole (PRILOSEC) 40 MG capsule Take 40 mg by mouth daily as needed (acid reflux/indigestion.).   ondansetron (ZOFRAN) 8 MG tablet Take 1 tablet (8 mg total) by mouth every 8 (eight) hours as needed.   OZEMPIC, 0.25 OR 0.5 MG/DOSE, 2 MG/3ML SOPN Inject 0.5 mg into the skin once a week.   pantoprazole (PROTONIX) 40 MG tablet Take 1 tablet (40 mg total) by mouth daily.   potassium chloride SA (KLOR-CON) 20 MEQ tablet Take 20 mEq by mouth 2 (two) times daily.   rosuvastatin (CRESTOR) 20 MG tablet Take 20 mg by mouth daily.    No facility-administered encounter medications on file as of 11/17/2022.    Allergies as of 11/17/2022 - Review Complete 11/17/2022  Allergen Reaction Noted   Penicillins Hives, Rash, and Other (See Comments) 03/13/2012    Past Medical History:  Diagnosis Date   Anemia 12/22/2012   Anxiety 2012   Arthritis    RA   Bilateral anterior knee pain 2012   Brain tumor ([place])    Diabetes mellitus without complication ([place])    Enlarged pulmonary artery ([place]) 11/03/2022   GERD (gastroesophageal reflux disease) 2000   come and go   Headache  As a child   Hyperlipidemia    Panic attack 2012   situational related to move from [place].     Past Surgical History:  Procedure Laterality Date   APPLICATION OF CRANIAL NAVIGATION N/A 07/02/2019   Procedure: APPLICATION OF CRANIAL NAVIGATION;  Surgeon: Judith Part, MD;  Location: [facility];  Service: Neurosurgery;  Laterality: N/A;   CESAREAN SECTION  11/22/2000   MENISCUS REPAIR Left 11/22/2009   L knee for bilateral meniscal tear. Following acute injury.    PARTIAL HYSTERECTOMY  11/23/2007   RETROSIGMOID CRANIECTOMY FOR TUMOR RESECTION Left 07/02/2019   Procedure: Left Retrosigmoid craniotomy for tumor  resection;  Surgeon: Judith Part, MD;  Location: [facility];  Service: Neurosurgery;  Laterality: Left;    Family History  Problem Relation Age of Onset   Diabetes Mother    Hypertension Mother    Heart disease Mother 74   Stroke Mother 23       x 2   Alcohol abuse Mother        previous    Drug abuse Mother        previous cocaine    Heart attack Mother    Hypertension Father    Hyperlipidemia Maternal Grandmother    Colon cancer Neg Hx    Esophageal cancer Neg Hx    Stomach cancer Neg Hx    Rectal cancer Neg Hx     Social History   Socioeconomic History   Marital status: Single    Spouse name: Not on file   Number of children: 3   Years of education: assoc. deg   Highest education level: Not on file  Occupational History   Occupation: Facilities manager: [employer]   Occupation: Charity fundraiser: Theme park manager  Tobacco Use   Smoking status: Never   Smokeless tobacco: Never  Vaping Use   Vaping Use: Never used  Substance and Sexual Activity   Alcohol use: Yes    Comment: occassional, social, wine   Drug use: No   Sexual activity: Not Currently    Partners: Male    Birth control/protection: Condom, Surgical  Other Topics Concern   Not on file  Social History Narrative   Live with two children 16 and 11.       epworth sleepiness scale = 3 (02/27/16)       Social Determinants of Health   Financial Resource Strain: Not on file  Food Insecurity: No Food Insecurity (10/18/2022)   Hunger Vital Sign    Worried About Running Out of Food in the Last Year: Never true    Ran Out of Food in the Last Year: Never true  Transportation Needs: No Transportation Needs (10/18/2022)   PRAPARE - Hydrologist (Medical): No    Lack of Transportation (Non-Medical): No  Physical Activity: Inactive (10/18/2022)   Exercise Vital Sign    Days of Exercise per Week: 0 days    Minutes of Exercise per Session: 0 min   Stress: Not on file  Social Connections: Not on file  Intimate Partner Violence: Not on file    Review of Systems  Constitutional:  Positive for fatigue.  Respiratory:  Positive for cough, chest tightness and shortness of breath.     Vitals:   11/17/22 1503  BP: 122/84  Pulse: 84  Temp: 98.2 F (36.8 C)  SpO2: 95%     Physical Exam Constitutional:  Appearance: She is obese.  HENT:     Head: Normocephalic.     Mouth/Throat:     Mouth: Mucous membranes are moist.  Eyes:     Pupils: Pupils are equal, round, and reactive to light.  Cardiovascular:     Rate and Rhythm: Normal rate and regular rhythm.     Heart sounds: No murmur heard.    No friction rub.  Pulmonary:     Effort: No respiratory distress.     Breath sounds: No stridor. No wheezing or rhonchi.  Musculoskeletal:     Cervical back: No rigidity or tenderness.  Neurological:     Mental Status: She is alert.  Psychiatric:        Mood and Affect: Mood normal.   Data Reviewed: Recent CT scan of the chest reviewed compared with a CT scan that she had done a few years back showing multifocal infiltrate There is also evidence of cardiomegaly and enlarged pulmonary arteries  Last echocardiogram from 2015 did not mention pulmonary hypertension, did show ejection fraction of 50 to 55%  Did have a repeat echocardiogram showing normal ejection fraction, normal right-sided pressures  Pulmonary function test with severe obstructive disease, severe restriction, severe reduction in diffusing capacity  Assessment:  Chest discomfort  Recent pneumonia  Shortness of breath on exertion -This is worsening  Abnormal CT scan of the chest -Multifocal infiltrates History of diabetes/reflux  Concern for pulmonary hypertension -Recent echo does not show pulmonary hypertension  Plan/Recommendations: Repeat CT scan of the chest ordered -Obtain high-resolution CT  Obtain CBC with differentials, IgE levels,  hypersensitivity pneumonitis panel  Obtain sleep study-Home sleep test  Following CT scan findings, may benefit from bronchoscopy if still with significant symptoms  Continue albuterol as needed  Graded activities as tolerated  Follow-up in about 6 weeks  Sherrilyn Rist MD [facility] 11/17/2022, 3:30 PM  CC: [facility], [facility]

## 2022-11-17 NOTE — Progress Notes (Signed)
Spirometry/DLCO and Pleth performed today. Patient used Albuterol 1 hour before appointment.

## 2022-11-18 ENCOUNTER — Other Ambulatory Visit: Payer: Self-pay

## 2022-11-18 LAB — CBC WITH DIFFERENTIAL/PLATELET
Basophils Absolute: 0.1 10*3/uL (ref 0.0–0.1)
Basophils Relative: 1.4 % (ref 0.0–3.0)
Eosinophils Absolute: 0.2 10*3/uL (ref 0.0–0.7)
Eosinophils Relative: 4 % (ref 0.0–5.0)
HCT: 49.1 % — ABNORMAL HIGH (ref 36.0–46.0)
Hemoglobin: 15.9 g/dL — ABNORMAL HIGH (ref 12.0–15.0)
Lymphocytes Relative: 20.7 % (ref 12.0–46.0)
Lymphs Abs: 1.3 10*3/uL (ref 0.7–4.0)
MCHC: 32.5 g/dL (ref 30.0–36.0)
MCV: 85.3 fl (ref 78.0–100.0)
Monocytes Absolute: 0.5 10*3/uL (ref 0.1–1.0)
Monocytes Relative: 8.7 % (ref 3.0–12.0)
Neutro Abs: 4 10*3/uL (ref 1.4–7.7)
Neutrophils Relative %: 65.2 % (ref 43.0–77.0)
Platelets: 240 10*3/uL (ref 150.0–400.0)
RBC: 5.75 Mil/uL — ABNORMAL HIGH (ref 3.87–5.11)
RDW: 14.4 % (ref 11.5–15.5)
WBC: 6.1 10*3/uL (ref 4.0–10.5)

## 2022-11-18 NOTE — Progress Notes (Signed)
Your blood count is within normal limits-not consistent with fighting an infection at present Your IgE levels is within normal limits  We will wait and see what the CT scan of the chest shows

## 2022-11-22 LAB — HYPERSENSITIVITY PNUEMONITIS PROFILE
ASPERGILLUS FUMIGATUS: NEGATIVE
Faenia retivirgula: NEGATIVE
Pigeon Serum: NEGATIVE
S. VIRIDIS: NEGATIVE
T. CANDIDUS: NEGATIVE
T. VULGARIS: NEGATIVE

## 2022-11-22 LAB — IGE: IgE (Immunoglobulin E), Serum: 54 kU/L (ref <=114)

## 2022-11-24 ENCOUNTER — Telehealth: Payer: Self-pay | Admitting: Pulmonary Disease

## 2022-11-24 DIAGNOSIS — R0609 Other forms of dyspnea: Secondary | ICD-10-CM

## 2022-11-24 NOTE — Telephone Encounter (Signed)
Okay to place referral to allergist  Order CT scan of the chest without contrast

## 2022-11-24 NOTE — Telephone Encounter (Signed)
Called and spoke to patient and went over that I was placing referral for allergy doctor for her and she voiced understanding.   She also asked if someone could call her to schedule her home sleep test and CT scan. She states she has been waiting since September.   Please advise ladies.   Thank you

## 2022-11-24 NOTE — Addendum Note (Signed)
Addended by: Retia Passe on: 11/24/2022 08:57 AM   Modules accepted: Orders

## 2022-11-26 ENCOUNTER — Telehealth: Payer: Self-pay | Admitting: Pulmonary Disease

## 2022-11-26 ENCOUNTER — Other Ambulatory Visit: Payer: Self-pay | Admitting: Pulmonary Disease

## 2022-11-26 NOTE — Telephone Encounter (Signed)
Referral for allergist already placed  Request for CT scan of the chest without contrast to compare with previous CT-CT scan from September 2023 was abnormal with haziness in the lungs on both sides  Blood work has been negative -not consistent with fighting an infection   This does not disprove that you were exposed to mold

## 2022-11-29 ENCOUNTER — Other Ambulatory Visit: Payer: Self-pay

## 2022-11-29 DIAGNOSIS — R9389 Abnormal findings on diagnostic imaging of other specified body structures: Secondary | ICD-10-CM

## 2022-11-29 NOTE — Telephone Encounter (Signed)
I spoke to pt and she stated she is frustrated by not getting the care she needs so she can get better. I inquired about her HST test with our PCC's and was informed she will be getting a call soon. I also reiterated Dr Judson Roch interpretation of his findings. And placed an new CT scan w/o contrast. Pt verbalized understanding. Nothing further needed.

## 2022-12-01 ENCOUNTER — Ambulatory Visit: Payer: 59

## 2022-12-01 DIAGNOSIS — G4733 Obstructive sleep apnea (adult) (pediatric): Secondary | ICD-10-CM | POA: Diagnosis not present

## 2022-12-09 ENCOUNTER — Encounter (HOSPITAL_COMMUNITY): Payer: Self-pay

## 2022-12-09 ENCOUNTER — Ambulatory Visit (HOSPITAL_COMMUNITY)
Admission: RE | Admit: 2022-12-09 | Discharge: 2022-12-09 | Disposition: A | Discharge status: Home / Self Care | Payer: 59 | Source: Ambulatory Visit | Attending: Pulmonary Disease | Admitting: Pulmonary Disease

## 2022-12-09 DIAGNOSIS — J479 Bronchiectasis, uncomplicated: Secondary | ICD-10-CM | POA: Insufficient documentation

## 2022-12-09 DIAGNOSIS — R9389 Abnormal findings on diagnostic imaging of other specified body structures: Secondary | ICD-10-CM | POA: Insufficient documentation

## 2022-12-09 DIAGNOSIS — R079 Chest pain, unspecified: Secondary | ICD-10-CM | POA: Insufficient documentation

## 2022-12-09 DIAGNOSIS — I700 Atherosclerosis of aorta: Secondary | ICD-10-CM | POA: Insufficient documentation

## 2022-12-10 ENCOUNTER — Telehealth: Payer: Self-pay | Admitting: Pulmonary Disease

## 2022-12-10 NOTE — Telephone Encounter (Signed)
Call Report/Not urgent. Please call : [place]

## 2022-12-13 NOTE — Telephone Encounter (Signed)
CT chest shows ILD changes ? NSIP -she has  an appointment with Dr. Ander Slade next week to discuss in detail.  Will send to Dr. Ander Slade for review. He will go over at appointment next week

## 2022-12-13 NOTE — Telephone Encounter (Signed)
[name] please review CT Chest call report as Dr. Ander Slade is not available, thank you    CLINICAL DATA:  Follow-up pneumonia. Continued shortness of breath and chest pain.   EXAM: CT CHEST WITHOUT CONTRAST   TECHNIQUE: Multidetector CT imaging of the chest was performed following the standard protocol without IV contrast.   RADIATION DOSE REDUCTION: This exam was performed according to the departmental dose-optimization program which includes automated exposure control, adjustment of the mA and/or kV according to patient size and/or use of iterative reconstruction technique.   COMPARISON:  CT angiogram chest 08/15/2022. CT chest abdomen and pelvis 06/19/2021   FINDINGS: Cardiovascular: No significant vascular findings. Normal heart size. No pericardial effusion. There are atherosclerotic calcifications of the aorta.   Mediastinum/Nodes: There are punctate calcifications in the thyroid gland, unchanged. There are nonenlarged mediastinal and hilar lymph nodes. No enlarged lymph nodes are identified allowing for lack of intravenous contrast. The esophagus is nondilated. There is a small hiatal hernia.   Lungs/Pleura: There are patchy multifocal predominantly peripheral interstitial and ground-glass opacities in the lower lungs most significant in the left lower lobe. Interstitial prominence has progressed compared to the prior study. There is also new traction bronchiectasis and architectural distortion predominantly in the lung bases. There is no lung consolidation identified. Airways appear patent. No evidence for emphysema, pleural effusion or pneumothorax.   Upper Abdomen: No acute abnormality.   Musculoskeletal: No chest wall mass or suspicious bone lesions identified.   IMPRESSION: 1. Interval progression of multifocal interstitial and ground-glass opacities in the lower lungs, most significant in the left lower lobe. New traction bronchiectasis and architectural  distortion in the lung bases. Findings are compatible with evolving fibrotic interstitial lung disease favoring fibrotic NSIP. Suggest pulmonary consultation and dedicated high-resolution chest CT.   Aortic Atherosclerosis (ICD10-I70.0).     Electronically Signed   By: Ronney Asters M.D.   On: 12/10/2022 16:14

## 2022-12-16 ENCOUNTER — Ambulatory Visit (HOSPITAL_BASED_OUTPATIENT_CLINIC_OR_DEPARTMENT_OTHER): Payer: 59 | Admitting: Family

## 2022-12-16 NOTE — Progress Notes (Deleted)
Office Visit    Patient Name: Carolyn Holland Date of Encounter: 12/16/2022  PCP:  [facility], [facility]  Cardiologist:  Pixie Casino, MD  Advanced Practice Provider:  No care team member to display Electrophysiologist:  None      Chief Complaint    Carolyn Holland is a 52 y.o. female presents today for ***   Past Medical History    Past Medical History:  Diagnosis Date   Anemia 12/22/2012   Anxiety 2012   Arthritis    RA   Bilateral anterior knee pain 2012   Brain tumor ([place])    Diabetes mellitus without complication ([place])    Enlarged pulmonary artery ([place]) 11/03/2022   GERD (gastroesophageal reflux disease) 2000   come and go   Headache    As a child   Hyperlipidemia    Panic attack 2012   situational related to move from [place].    Past Surgical History:  Procedure Laterality Date   APPLICATION OF CRANIAL NAVIGATION N/A 07/02/2019   Procedure: APPLICATION OF CRANIAL NAVIGATION;  Surgeon: Judith Part, MD;  Location: [facility];  Service: Neurosurgery;  Laterality: N/A;   CESAREAN SECTION  11/22/2000   MENISCUS REPAIR Left 11/22/2009   L knee for bilateral meniscal tear. Following acute injury.    PARTIAL HYSTERECTOMY  11/23/2007   RETROSIGMOID CRANIECTOMY FOR TUMOR RESECTION Left 07/02/2019   Procedure: Left Retrosigmoid craniotomy for tumor resection;  Surgeon: Judith Part, MD;  Location: [facility];  Service: Neurosurgery;  Laterality: Left;    Allergies  Allergies  Allergen Reactions   Penicillins Hives, Rash and Other (See Comments)    Has patient had a PCN reaction causing immediate rash, facial/tongue/throat swelling, SOB or lightheadedness with hypotension: Yes Has patient had a PCN reaction causing severe rash involving mucus membranes or skin necrosis: No Has patient had a PCN reaction that required hospitalization emergency room visit but did not stay Has patient had a PCN  reaction occurring within the last 10 years: No If all of the above answers are "NO", then may proceed with Cephalosporin use      History of Present Illness    Carolyn Holland is a 52 y.o. female with a hx of hypertension, hyperlipidemia, diabetes mellitus, cancer, anxiety last seen 10/18/2022 by Dr. Oval Linsey.  Prior nuclear stress test 2017 negative.  Holter monitor 2019 occasional PVC and sinus arrhythmia but no significant findings.  She was treating for pneumonia 07/2022.  CT with enlargement of the pulmonary artery and referred to cardiology for evaluation.  Pulmonary artery was not enlarged by prior CT 05/2021.  She saw Dr. Saunders Revel of 10/18/2022.  Noted constant chest pain and shortness of breath since pneumonia diagnosis.  Her chest pain was overall atypical.  ESR was unremarkable. Coronary CTA ordered but unable to be performed as heart rate elevated in 80s despite IV metoprolol and diltiazem. Subsequent myoview 112/15/23 low risk.      EKGs/Labs/Other Studies Reviewed:   The following studies were reviewed today: Myoview 11/05/22   The study is normal. The study is low risk.   No ST deviation was noted.   LV perfusion is normal. There is no evidence of ischemia. There is no evidence of infarction.   Left ventricular function is normal. End diastolic cavity size is normal. End systolic cavity size is normal.   Prior study available for comparison from 03/04/2016.   Normal stress nuclear study  with no ischemia or infarction.  Gated ejection fraction 64% with normal wall motion.   Echo 09/15/22: IMPRESSIONS   1. Left ventricular ejection fraction, by estimation, is 55 to 60%. Left  ventricular ejection fraction by 3D volume is 59 %. The left ventricle has  normal function. The left ventricle has no regional wall motion  abnormalities. There is severe asymmetric   left ventricular hypertrophy of the basal-septal segment. Left  ventricular diastolic parameters are consistent with  Grade I diastolic  dysfunction (impaired relaxation). The average left ventricular global  longitudinal strain is -19.6 %. The global  longitudinal strain is normal. There is chordal systolic anterior mortion  of the mitral valve apparatus without LVOT obstruction.   2. Right ventricular systolic function is normal. The right ventricular  size is normal. There is normal pulmonary artery systolic pressure.   3. The mitral valve is normal in structure. No evidence of mitral valve  regurgitation. No evidence of mitral stenosis.   4. The aortic valve is tricuspid. Aortic valve regurgitation is not  visualized.    Chest X-ray 08/15/22: FINDINGS: Stable cardiomediastinal silhouette with normal heart size. No pneumothorax. No pleural effusion. No pulmonary edema. Patchy streaky opacities at the left greater than right lung bases have improved from prior chest radiograph.   IMPRESSION: Patchy streaky opacities at the left greater than right lung bases have improved from prior chest radiograph, most compatible with improving pneumonia.   EKG:  EKG is *** ordered today.  The ekg ordered today demonstrates ***  Recent Labs: 04/15/2022: B Natriuretic Peptide 6.0 08/15/2022: ALT 15 10/21/2022: BUN 11; Creatinine, Ser 0.86; Potassium 3.6; Sodium 141 11/17/2022: Hemoglobin 15.9; Platelets 240.0  Recent Lipid Panel    Component Value Date/Time   CHOL 200 02/12/2016 1004   TRIG 54.0 02/12/2016 1004   HDL 68.90 02/12/2016 1004   CHOLHDL 3 02/12/2016 1004   VLDL 10.8 02/12/2016 1004   LDLCALC 120 (H) 02/12/2016 1004    Risk Assessment/Calculations:  {Does this patient have ATRIAL FIBRILLATION?[phone]}  Home Medications   No outpatient medications have been marked as taking for the 12/16/22 encounter (Appointment) with Loel Dubonnet, NP.     Review of Systems   ***   All other systems reviewed and are otherwise negative except as noted above.  Physical Exam    VS:  There were  no vitals taken for this visit. , BMI There is no height or weight on file to calculate BMI.  Wt Readings from Last 3 Encounters:  11/17/22 233 lb 12.8 oz (106.1 kg)  11/04/22 245 lb (111.1 kg)  10/18/22 245 lb 6.4 oz (111.3 kg)     GEN: Well nourished, well developed, in no acute distress. HEENT: normal. Neck: Supple, no JVD, carotid bruits, or masses. Cardiac: ***RRR, no murmurs, rubs, or gallops. No clubbing, cyanosis, edema.  ***Radials/PT 2+ and equal bilaterally.  Respiratory:  ***Respirations regular and unlabored, clear to auscultation bilaterally. GI: Soft, nontender, nondistended. MS: No deformity or atrophy. Skin: Warm and dry, no rash. Neuro:  Strength and sensation are intact. Psych: Normal affect.  Assessment & Plan    Hypertension- ***Current to hypertensive regimen includes chlorthalidone 25 mg daily.  DM2 / HLD - Continue to follow with PCP. Appreciate inclusion of SLT2i and GLP1.   Atypical chest pain-  OSA-continue working on weight loss. Declines CPAP.   Enlarged pulmonary artery-pulmonary artery enlargement noted on chest CT.  However this was a time of pneumonia diagnosis.  Subsequent echocardiogram with no  evidence of pulmonary hypertension.  No indication for further workup.  No BP recorded.  {Refresh Note OR Click here to enter BP  :1}***      Disposition: Follow up {follow up:15908} with Pixie Casino, MD or APP.  Signed, Loel Dubonnet, NP 12/16/2022, 1:28 PM [facility]

## 2022-12-21 ENCOUNTER — Ambulatory Visit: Payer: 59 | Admitting: Pulmonary Disease

## 2022-12-22 ENCOUNTER — Telehealth: Payer: Self-pay | Admitting: Pulmonary Disease

## 2022-12-22 DIAGNOSIS — G4733 Obstructive sleep apnea (adult) (pediatric): Secondary | ICD-10-CM

## 2022-12-22 NOTE — Telephone Encounter (Signed)
Call patient  Sleep study result  Date of study: 12/01/2022  Impression: Mild obstructive sleep apnea Mild oxygen desaturations  Recommendation: Options of treatment for mild obstructive sleep apnea will include  1.  CPAP therapy if there is significant daytime sleepiness or other comorbidities including history of CVA or cardiac disease  -If CPAP is chosen as an option of treatment auto titrating CPAP with a pressure setting of 5-15 will be appropriate  2.  Watchful waiting with emphasis on weight loss measures, sleep position modification to optimize lateral sleep, elevating the head of the bed by about 30 degrees may also help.  3.  An oral device may be fashioned for the treatment of mild sleep disordered breathing, will involve referral to dentist.  Follow-up as previously scheduled

## 2022-12-23 NOTE — Telephone Encounter (Signed)
Spoke with the pt and notified of results of sleep study. Pt verbalized understanding and was agreeable to CPAP therapy. I have placed DME referral for this and pt aware to contact the office for 31-90 day f/u once they begin using machine per insurance requirement.   

## 2022-12-31 ENCOUNTER — Other Ambulatory Visit: Payer: Self-pay

## 2023-01-28 NOTE — Telephone Encounter (Signed)
1 

## 2023-10-27 ENCOUNTER — Other Ambulatory Visit: Payer: Self-pay

## 2023-11-08 ENCOUNTER — Emergency Department (HOSPITAL_BASED_OUTPATIENT_CLINIC_OR_DEPARTMENT_OTHER)
Admission: EM | Admit: 2023-11-08 | Discharge: 2023-11-08 | Disposition: A | Discharge status: Home / Self Care | Payer: Self-pay | Attending: Emergency Medicine | Admitting: Emergency Medicine

## 2023-11-08 ENCOUNTER — Encounter: Payer: Self-pay | Admitting: Hematology and Oncology

## 2023-11-08 ENCOUNTER — Emergency Department (HOSPITAL_BASED_OUTPATIENT_CLINIC_OR_DEPARTMENT_OTHER): Payer: Self-pay | Admitting: Radiology

## 2023-11-08 ENCOUNTER — Encounter (HOSPITAL_BASED_OUTPATIENT_CLINIC_OR_DEPARTMENT_OTHER): Payer: Self-pay | Admitting: Emergency Medicine

## 2023-11-08 ENCOUNTER — Other Ambulatory Visit: Payer: Self-pay

## 2023-11-08 ENCOUNTER — Emergency Department (HOSPITAL_BASED_OUTPATIENT_CLINIC_OR_DEPARTMENT_OTHER): Payer: Self-pay

## 2023-11-08 DIAGNOSIS — I10 Essential (primary) hypertension: Secondary | ICD-10-CM

## 2023-11-08 DIAGNOSIS — Z79899 Other long term (current) drug therapy: Secondary | ICD-10-CM | POA: Insufficient documentation

## 2023-11-08 DIAGNOSIS — Z1152 Encounter for screening for COVID-19: Secondary | ICD-10-CM | POA: Insufficient documentation

## 2023-11-08 DIAGNOSIS — R42 Dizziness and giddiness: Secondary | ICD-10-CM

## 2023-11-08 DIAGNOSIS — Z7982 Long term (current) use of aspirin: Secondary | ICD-10-CM | POA: Insufficient documentation

## 2023-11-08 LAB — RESP PANEL BY RT-PCR (RSV, FLU A&B, COVID)  RVPGX2
Influenza A by PCR: NEGATIVE
Influenza B by PCR: NEGATIVE
Resp Syncytial Virus by PCR: NEGATIVE
SARS Coronavirus 2 by RT PCR: NEGATIVE

## 2023-11-08 LAB — CBC
HCT: 46.2 % — ABNORMAL HIGH (ref 36.0–46.0)
Hemoglobin: 15.2 g/dL — ABNORMAL HIGH (ref 12.0–15.0)
MCH: 28.8 pg (ref 26.0–34.0)
MCHC: 32.9 g/dL (ref 30.0–36.0)
MCV: 87.5 fL (ref 80.0–100.0)
Platelets: 217 10*3/uL (ref 150–400)
RBC: 5.28 MIL/uL — ABNORMAL HIGH (ref 3.87–5.11)
RDW: 12.8 % (ref 11.5–15.5)
WBC: 6.3 10*3/uL (ref 4.0–10.5)
nRBC: 0 % (ref 0.0–0.2)

## 2023-11-08 LAB — BASIC METABOLIC PANEL
Anion gap: 8 (ref 5–15)
BUN: 17 mg/dL (ref 6–20)
CO2: 28 mmol/L (ref 22–32)
Calcium: 9.5 mg/dL (ref 8.9–10.3)
Chloride: 103 mmol/L (ref 98–111)
Creatinine, Ser: 0.7 mg/dL (ref 0.44–1.00)
GFR, Estimated: >60 mL/min (ref >60)
Glucose, Bld: 106 mg/dL — ABNORMAL HIGH (ref 70–99)
Potassium: 3.5 mmol/L (ref 3.5–5.1)
Sodium: 139 mmol/L (ref 135–145)

## 2023-11-08 LAB — CBG MONITORING, ED: Glucose-Capillary: 91 mg/dL (ref 70–99)

## 2023-11-08 LAB — TROPONIN I (HIGH SENSITIVITY)
Troponin I (High Sensitivity): <2 ng/L (ref <18)
Troponin I (High Sensitivity): <2 ng/L (ref <18)

## 2023-11-08 MED ORDER — AMLODIPINE BESYLATE 5 MG PO TABS
5.0000 mg | ORAL_TABLET | Freq: Once | ORAL | Status: AC
  Administered 2023-11-08: 5 mg via ORAL
  Filled 2023-11-08: qty 1

## 2023-11-08 MED ORDER — AMLODIPINE BESYLATE 5 MG PO TABS: 5.0000 mg | ORAL_TABLET | Freq: Every day | ORAL | 1 refills | Status: AC

## 2023-11-08 MED ORDER — LORAZEPAM 1 MG PO TABS
1.0000 mg | ORAL_TABLET | Freq: Once | ORAL | Status: AC
  Administered 2023-11-08: 1 mg via ORAL
  Filled 2023-11-08: qty 1

## 2023-11-08 MED ORDER — MECLIZINE HCL 25 MG PO TABS: 25.0000 mg | ORAL_TABLET | Freq: Three times a day (TID) | ORAL | 0 refills | Status: AC | PRN

## 2023-11-08 MED ORDER — MECLIZINE HCL 25 MG PO TABS
25.0000 mg | ORAL_TABLET | Freq: Once | ORAL | Status: AC
  Administered 2023-11-08: 25 mg via ORAL
  Filled 2023-11-08: qty 1

## 2023-11-08 MED ORDER — ACETAMINOPHEN 325 MG PO TABS: 650.0000 mg | ORAL_TABLET | Freq: Four times a day (QID) | ORAL | Status: DC | PRN

## 2023-11-08 NOTE — ED Notes (Signed)
Patient transported to CT 

## 2023-11-08 NOTE — ED Provider Notes (Signed)
[place] EMERGENCY DEPARTMENT AT [facility] Provider Note   CSN: [id number] Arrival date & time: 11/08/23  1117     History  Chief Complaint  Patient presents with   Chest Pain    Carolyn Holland is a 52 y.o. female.  Pt complains of dizziness that started yesterday.  Pt reports her blood pressure has also been high.  Patient has a history of a brain tumor.  Patient reports that the brain tumor was not a cancer.  Patient reports that she had the tumor rewritten moved and had radiation to the area to make sure that it did not return.  Patient reports that she is on chlorthalidone for high blood pressure.  Patient takes Ozempic metformin and Jardiance for diabetes.  Patient is on medication for elevated cholesterol.  Patient has seen Dr. Duke Salvia cardiologist in the past.  Patient reports dizziness increases when she moves.  When patient goes from sitting to lying she became very dizzy.  Patient reports when she moves her head side-to-side this causes her to be dizzy.  The history is provided by the patient. No language interpreter was used.  Chest Pain      Home Medications Prior to Admission medications   Medication Sig Start Date End Date Taking? Authorizing Provider  amLODipine (NORVASC) 5 MG tablet Take 1 tablet (5 mg total) by mouth daily. 11/08/23 11/07/24 Yes Elson Areas, PA-C  meclizine (ANTIVERT) 25 MG tablet Take 1 tablet (25 mg total) by mouth 3 (three) times daily as needed for dizziness. 11/08/23  Yes Cheron Schaumann K, PA-C  albuterol (VENTOLIN HFA) 108 (90 Base) MCG/ACT inhaler Inhale 1 puff into the lungs every 6 (six) hours as needed for wheezing or shortness of breath. 09/29/22   Tomma Lightning, MD  ALPRAZolam Prudy Feeler) 0.5 MG tablet Take 0.5 mg by mouth 3 (three) times daily as needed for anxiety. 10/13/20   [provider]  aspirin 325 MG tablet Take 325 mg by mouth daily.    [provider]  chlorthalidone (HYGROTON) 25 MG tablet  Take 25 mg by mouth daily. 05/18/22   [provider]  Continuous Blood Gluc Sensor (FREESTYLE LIBRE 2 SENSOR) MISC See admin instructions. 06/20/20   [provider]  diclofenac sodium (VOLTAREN) 1 % GEL Apply 2 g topically 4 (four) times daily as needed for pain. 05/12/19   [provider]  famotidine (PEPCID) 20 MG tablet Take 1 tablet (20 mg total) by mouth 2 (two) times daily. 07/30/22   Renne Crigler, PA-C  JARDIANCE 25 MG TABS tablet Take 25 mg by mouth daily. 10/02/20   [provider]  metFORMIN (GLUCOPHAGE-XR) 500 MG 24 hr tablet Take 500 mg by mouth daily. 02/21/19   [provider]  metoprolol tartrate (LOPRESSOR) 100 MG tablet TAKE 1 TABLET 2 HOURS PRIOR TO CT 10/18/22   Chilton Si, MD  Multiple Vitamins-Minerals (MULTI FOR HER) TABS See admin instructions.    [provider]  omeprazole (PRILOSEC) 40 MG capsule Take 40 mg by mouth daily as needed (acid reflux/indigestion.).    [provider]  ondansetron (ZOFRAN) 8 MG tablet Take 1 tablet (8 mg total) by mouth every 8 (eight) hours as needed. 06/22/21   Jaci Standard, MD  OZEMPIC, 0.25 OR 0.5 MG/DOSE, 2 MG/3ML SOPN Inject 0.5 mg into the skin once a week.    [provider]  pantoprazole (PROTONIX) 40 MG tablet Take 1 tablet (40 mg total) by mouth daily. 08/31/22  Olalere, Adewale A, MD  potassium chloride SA (KLOR-CON) 20 MEQ tablet Take 20 mEq by mouth 2 (two) times daily. 07/30/20   [provider]  rosuvastatin (CRESTOR) 20 MG tablet Take 20 mg by mouth daily.     [provider]      Allergies    Penicillins    Review of Systems   Review of Systems  Cardiovascular:  Positive for chest pain.  All other systems reviewed and are negative.   Physical Exam Updated Vital Signs BP (!) 162/104   Pulse 79   Temp 98.4 F (36.9 C) (Oral)   Resp 14   SpO2 97%  Physical Exam Vitals and nursing note reviewed.  Constitutional:       Appearance: She is well-developed.  HENT:     Head: Normocephalic.  Cardiovascular:     Rate and Rhythm: Normal rate and regular rhythm.     Heart sounds: Normal heart sounds.  Pulmonary:     Effort: Pulmonary effort is normal.  Abdominal:     General: There is no distension.     Palpations: Abdomen is soft.  Musculoskeletal:        General: Normal range of motion.     Cervical back: Normal range of motion.  Skin:    General: Skin is warm.  Neurological:     General: No focal deficit present.     Mental Status: She is alert and oriented to person, place, and time.     ED Results / Procedures / Treatments   Labs (all labs ordered are listed, but only abnormal results are displayed) Labs Reviewed  BASIC METABOLIC PANEL - Abnormal; Notable for the following components:      Result Value   Glucose, Bld 106 (*)    All other components within normal limits  CBC - Abnormal; Notable for the following components:   RBC 5.28 (*)    Hemoglobin 15.2 (*)    HCT 46.2 (*)    All other components within normal limits  RESP PANEL BY RT-PCR (RSV, FLU A&B, COVID)  RVPGX2  CBG MONITORING, ED  TROPONIN I (HIGH SENSITIVITY)  TROPONIN I (HIGH SENSITIVITY)    EKG None  Radiology CT Head Wo Contrast Result Date: 11/08/2023 CLINICAL DATA:  Headache, new onset (Age >= 51y) EXAM: CT HEAD WITHOUT CONTRAST TECHNIQUE: Contiguous axial images were obtained from the base of the skull through the vertex without intravenous contrast. RADIATION DOSE REDUCTION: This exam was performed according to the departmental dose-optimization program which includes automated exposure control, adjustment of the mA and/or kV according to patient size and/or use of iterative reconstruction technique. COMPARISON:  Head CT 09/10/2020, brain MRI 06/07/2022 FINDINGS: Brain: Patient is post meningioma resection, the residual tumor on prior MRI is not well-defined by CT, faint density is seen in the left posterior fossa,  series 3, image 9. There is no intracranial hemorrhage, hydrocephalus, or evidence of acute ischemia. No subdural or extra-axial collection. Enlarged partially empty sella, chronic. Vascular: No hyperdense vessel or unexpected calcification. Skull: Left occipital craniotomy.  No acute findings. Sinuses/Orbits: Paranasal sinuses and mastoid air cells are clear. The visualized orbits are unremarkable. Other: None. IMPRESSION: 1. No acute intracranial abnormality. 2. Patient is post meningioma resection, the residual tumor on prior MRI is not well-defined by CT, faint density is seen at site of residual tumor. As clinically indicated, MRI would provide more detailed assessment. 3. Enlarged partially empty sella, chronic. This can be seen with idiopathic intracranial hypertension. Electronically  Signed   By: Narda Rutherford M.D.   On: 11/08/2023 17:35   DG Chest 2 View Result Date: 11/08/2023 CLINICAL DATA:  Chest pain EXAM: CHEST - 2 VIEW COMPARISON:  08/15/2022 chest radiograph. FINDINGS: Stable cardiomediastinal silhouette with normal heart size. No pneumothorax. No pleural effusion. Mild to moderate streaky patchy bibasilar lung opacities, increased from prior chest radiograph, but not appreciably changed from 12/09/2022 chest CT. No pulmonary edema. IMPRESSION: Chronic mild to moderate streaky patchy bibasilar lung opacities, increased from prior chest radiograph, but not appreciably changed from 12/09/2022 chest CT. Findings favor chronic fibrotic interstitial lung disease as detailed on prior chest CT report. High-resolution chest CT follow-up may be obtained as clinically warranted. Electronically Signed   By: Delbert Phenix M.D.   On: 11/08/2023 13:50    Procedures Procedures    Medications Ordered in ED Medications  acetaminophen (TYLENOL) tablet 650 mg (has no administration in time range)  meclizine (ANTIVERT) tablet 25 mg (has no administration in time range)  amLODipine (NORVASC) tablet 5 mg  (has no administration in time range)  LORazepam (ATIVAN) tablet 1 mg (1 mg Oral Given 11/08/23 1537)    ED Course/ Medical Decision Making/ A&P                                 Medical Decision Making Complains of dizziness with moving.  Patient reports she has noticed that her blood pressure is high  Amount and/or Complexity of Data Reviewed Independent Historian: friend    Details: Patient is here with a friend who is supportive External Data Reviewed: notes.    Details: Cardiology  and neurosurgery notes reviewed Labs: ordered. Decision-making details documented in ED Course.    Details: Labs ordered reviewed and interpreted troponin is negative x 2 Radiology: ordered and independent interpretation performed. Decision-making details documented in ED Course.    Details: Chest x-ray shows chronic intestinal lung changes Ct head no acute  ECG/medicine tests: ordered and independent interpretation performed. Decision-making details documented in ED Course.    Details: EKg shows normal sinus  normal egk.   Risk OTC drugs. Prescription drug management. Risk Details: Patient symptoms most consistent with vertigo.  I will treat her with Antivert.  I discussed patient's blood pressure with her she is advised to continue her current blood pressure medicine I will add amlodipine 5 mg.  Patient is advised to schedule to see her cardiologist for further evaluation.  She is advised to return to the emergency department if any problems patient is discharged in stable condition           Final Clinical Impression(s) / ED Diagnoses Final diagnoses:  Vertigo  Primary hypertension    Rx / DC Orders ED Discharge Orders          Ordered    amLODipine (NORVASC) 5 MG tablet  Daily        11/08/23 1807    meclizine (ANTIVERT) 25 MG tablet  3 times daily PRN        11/08/23 1807           An After Visit Summary was printed and given to the patient.    Osie Cheeks 11/08/23 Myriam Jacobson, MD 11/08/23 2317

## 2023-11-08 NOTE — ED Notes (Signed)
Pt. Upset about the length of stay and needing to be at work tonight at [employer]. [employer] notified.

## 2023-11-08 NOTE — ED Triage Notes (Signed)
Started yesterday with vertigo symptoms, dizziness Today headache chest pain .  Hx of "brain tumor"

## 2023-11-13 ENCOUNTER — Other Ambulatory Visit: Payer: Self-pay

## 2024-06-14 ENCOUNTER — Other Ambulatory Visit: Payer: Self-pay
